# Patient Record
Sex: Male | Born: 1955
Health system: Southern US, Community
[De-identification: ages and names within clinical notes are randomized; demographics above are authoritative.]

## PROBLEM LIST (undated history)

## (undated) DIAGNOSIS — I10 Essential (primary) hypertension: Secondary | ICD-10-CM

## (undated) DIAGNOSIS — Z87442 Personal history of urinary calculi: Secondary | ICD-10-CM

## (undated) DIAGNOSIS — I499 Cardiac arrhythmia, unspecified: Secondary | ICD-10-CM

## (undated) DIAGNOSIS — Z95810 Presence of automatic (implantable) cardiac defibrillator: Secondary | ICD-10-CM

## (undated) DIAGNOSIS — E785 Hyperlipidemia, unspecified: Secondary | ICD-10-CM

## (undated) DIAGNOSIS — I509 Heart failure, unspecified: Secondary | ICD-10-CM

## (undated) DIAGNOSIS — Z8489 Family history of other specified conditions: Secondary | ICD-10-CM

## (undated) DIAGNOSIS — I4891 Unspecified atrial fibrillation: Secondary | ICD-10-CM

## (undated) DIAGNOSIS — G4730 Sleep apnea, unspecified: Secondary | ICD-10-CM

## (undated) HISTORY — DX: Sleep apnea, unspecified: G47.30

## (undated) HISTORY — DX: Heart failure, unspecified: I50.9

## (undated) HISTORY — DX: Hyperlipidemia, unspecified: E78.5

---
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
## 2004-08-30 HISTORY — PX: KNEE SURGERY: SHX244

## 2006-11-28 DIAGNOSIS — I10 Essential (primary) hypertension: Secondary | ICD-10-CM | POA: Insufficient documentation

## 2006-11-28 DIAGNOSIS — E1169 Type 2 diabetes mellitus with other specified complication: Secondary | ICD-10-CM | POA: Insufficient documentation

## 2006-11-28 DIAGNOSIS — E785 Hyperlipidemia, unspecified: Secondary | ICD-10-CM | POA: Insufficient documentation

## 2006-11-28 DIAGNOSIS — E1159 Type 2 diabetes mellitus with other circulatory complications: Secondary | ICD-10-CM | POA: Insufficient documentation

## 2006-11-28 DIAGNOSIS — I152 Hypertension secondary to endocrine disorders: Secondary | ICD-10-CM | POA: Insufficient documentation

## 2007-10-16 DIAGNOSIS — E876 Hypokalemia: Secondary | ICD-10-CM | POA: Insufficient documentation

## 2007-11-03 ENCOUNTER — Ambulatory Visit: Payer: Self-pay | Admitting: Family Medicine

## 2010-08-30 HISTORY — PX: APPENDECTOMY: SHX54

## 2011-04-23 ENCOUNTER — Ambulatory Visit: Payer: Self-pay | Admitting: Unknown Physician Specialty

## 2011-04-27 LAB — PATHOLOGY REPORT

## 2011-05-19 ENCOUNTER — Ambulatory Visit: Payer: Self-pay | Admitting: Surgery

## 2011-05-26 ENCOUNTER — Inpatient Hospital Stay: Payer: Self-pay | Admitting: Surgery

## 2011-05-31 LAB — PATHOLOGY REPORT

## 2011-07-01 HISTORY — PX: COLECTOMY: SHX59

## 2014-04-02 DIAGNOSIS — R0602 Shortness of breath: Secondary | ICD-10-CM | POA: Insufficient documentation

## 2014-04-19 DIAGNOSIS — R002 Palpitations: Secondary | ICD-10-CM | POA: Insufficient documentation

## 2014-04-30 ENCOUNTER — Ambulatory Visit: Payer: Self-pay | Admitting: Internal Medicine

## 2014-05-16 DIAGNOSIS — Z9889 Other specified postprocedural states: Secondary | ICD-10-CM | POA: Insufficient documentation

## 2015-02-06 DIAGNOSIS — L821 Other seborrheic keratosis: Secondary | ICD-10-CM | POA: Insufficient documentation

## 2015-02-06 DIAGNOSIS — S022XXA Fracture of nasal bones, initial encounter for closed fracture: Secondary | ICD-10-CM | POA: Insufficient documentation

## 2015-02-06 DIAGNOSIS — R42 Dizziness and giddiness: Secondary | ICD-10-CM | POA: Insufficient documentation

## 2015-02-06 DIAGNOSIS — R0683 Snoring: Secondary | ICD-10-CM | POA: Insufficient documentation

## 2015-02-06 DIAGNOSIS — F439 Reaction to severe stress, unspecified: Secondary | ICD-10-CM | POA: Insufficient documentation

## 2015-02-06 DIAGNOSIS — E781 Pure hyperglyceridemia: Secondary | ICD-10-CM | POA: Insufficient documentation

## 2015-02-14 ENCOUNTER — Ambulatory Visit (INDEPENDENT_AMBULATORY_CARE_PROVIDER_SITE_OTHER): Payer: 59 | Admitting: Family Medicine

## 2015-02-14 ENCOUNTER — Other Ambulatory Visit: Payer: Self-pay

## 2015-02-14 ENCOUNTER — Encounter: Payer: Self-pay | Admitting: Family Medicine

## 2015-02-14 VITALS — BP 142/84 | HR 87 | Temp 98.0°F | Resp 18 | Ht 68.5 in | Wt 239.2 lb

## 2015-02-14 DIAGNOSIS — H9193 Unspecified hearing loss, bilateral: Secondary | ICD-10-CM | POA: Diagnosis not present

## 2015-02-14 DIAGNOSIS — R351 Nocturia: Secondary | ICD-10-CM | POA: Diagnosis not present

## 2015-02-14 DIAGNOSIS — E781 Pure hyperglyceridemia: Secondary | ICD-10-CM

## 2015-02-14 DIAGNOSIS — F17200 Nicotine dependence, unspecified, uncomplicated: Secondary | ICD-10-CM | POA: Insufficient documentation

## 2015-02-14 DIAGNOSIS — I10 Essential (primary) hypertension: Secondary | ICD-10-CM | POA: Diagnosis not present

## 2015-02-14 DIAGNOSIS — R079 Chest pain, unspecified: Secondary | ICD-10-CM | POA: Insufficient documentation

## 2015-02-14 DIAGNOSIS — R42 Dizziness and giddiness: Secondary | ICD-10-CM | POA: Diagnosis not present

## 2015-02-14 DIAGNOSIS — Z0000 Encounter for general adult medical examination without abnormal findings: Secondary | ICD-10-CM | POA: Insufficient documentation

## 2015-02-14 LAB — POCT URINALYSIS DIPSTICK
Bilirubin, UA: NEGATIVE
Blood, UA: NEGATIVE
Glucose, UA: NEGATIVE
Ketones, UA: NEGATIVE
Leukocytes, UA: NEGATIVE
Nitrite, UA: NEGATIVE
Protein, UA: NEGATIVE
Spec Grav, UA: 1.015
Urobilinogen, UA: 0.2
pH, UA: 6

## 2015-02-14 NOTE — Progress Notes (Signed)
Subjective: HME    Patient ID: Robert Grant, male    DOB: 12-27-1955, 59 y.o.   MRN: [id number]  HPI This 59 year old male presents for an annual physical exam. Complains of some occasional light headed sensation with very mild headache. No ataxia, nausea or syncopy. Thinks it may be due to changes in vision and need for new glasses. The patient has been in otherwise good general health in the past. Blood pressure well controlled and tolerating Amlodipine with Valsartan without side effects.   Patient Active Problem List   Diagnosis Date Noted  . Compulsive tobacco user syndrome 02/14/2015  . Chest pain 02/14/2015  . Closed fracture of nasal bones 02/06/2015  . Dizziness 02/06/2015  . Hypertriglyceridemia 02/06/2015  . Basal cell papilloma 02/06/2015  . Feeling stressed out 02/06/2015  . Snores 02/06/2015  . H/O cardiac catheterization 05/16/2014  . Awareness of heartbeats 04/19/2014  . Breath shortness 04/02/2014  . Decreased potassium in the blood 10/16/2007  . Essential (primary) hypertension 11/28/2006  . HLD (hyperlipidemia) 11/28/2006   Past Surgical History  Procedure Laterality Date  . Tonsillectomy and adenoidectomy  1960  . Knee surgery Right 2006  . Partial right colectomy for benign tumor  07/2011   History  Substance Use Topics  . Smoking status: Former Smoker -- 3.00 packs/day for 11 years    Types: Cigarettes  . Smokeless tobacco: Not on file  . Alcohol Use: No   Family History  Problem Relation Age of Onset  . Diabetes Mother   . Hypertension Mother   . Kidney disease Mother   . Hyperlipidemia Mother   . Lung cancer Father   . Congestive Heart Failure Maternal Grandmother   . Congestive Heart Failure Paternal Grandmother    Current Outpatient Prescriptions on File Prior to Visit  Medication Sig Dispense Refill  . amLODipine (NORVASC) 10 MG tablet Take 1 tablet by mouth daily.    . Choline Fenofibrate (TRILIPIX) 135 MG capsule Take 1 capsule by  mouth daily.    . valsartan (DIOVAN) 160 MG tablet Take 1 tablet by mouth daily.     No current facility-administered medications on file prior to visit.   Allergies  Allergen Reactions  . Niacin Hives   Review of Systems  Constitutional: Negative.   Eyes: Positive for visual disturbance.       Occasionally blurred vision and need glasses changed  Cardiovascular: Negative.   Gastrointestinal: Negative.        4-5 BM's per day since partial colectomy for a benign tumor  Genitourinary: Positive for difficulty urinating. Negative for dysuria, frequency, hematuria and flank pain.       Slow stream at night and nocturia 2-3 times a night  Musculoskeletal: Negative.   Neurological: Positive for light-headedness and headaches. Negative for weakness and numbness.       Very light headache occasionally  Psychiatric/Behavioral: Negative.       BP 142/84 mmHg  Pulse 87  Temp(Src) 98 F (36.7 C) (Oral)  Resp 18  Ht 5' 8.5" (1.74 m)  Wt 239 lb 3.2 oz (108.5 kg)  BMI 35.84 kg/m2  SpO2 96%  Objective:   Physical Exam  Constitutional: He is oriented to person, place, and time. He appears well-developed and well-nourished.  HENT:  Head: Normocephalic and atraumatic.  Right Ear: External ear normal.  Left Ear: External ear normal.  Nose: Nose normal.  Mouth/Throat: Oropharynx is clear and moist.  Eyes: Conjunctivae and EOM are normal. Pupils are  equal, round, and reactive to light. Right eye exhibits no discharge.  Neck: Normal range of motion. Neck supple. No tracheal deviation present. No thyromegaly present.  Cardiovascular: Normal rate, regular rhythm, normal heart sounds and intact distal pulses.   No murmur heard. Pulmonary/Chest: Effort normal and breath sounds normal. No respiratory distress. He has no wheezes. He has no rales. He exhibits no tenderness.  Abdominal: Soft. He exhibits no distension and no mass. There is no tenderness. There is no rebound and no guarding. Hernia  confirmed negative in the right inguinal area and confirmed negative in the left inguinal area.  Genitourinary: Rectum normal, prostate normal and penis normal. Guaiac negative stool.  Musculoskeletal: Normal range of motion. He exhibits no edema or tenderness.  Lymphadenopathy:    He has no cervical adenopathy.  Neurological: He is alert and oriented to person, place, and time. He has normal reflexes. No cranial nerve deficit. He exhibits normal muscle tone. Coordination normal.  Skin: Skin is warm and dry. No rash noted. No erythema.  Psychiatric: He has a normal mood and affect. His behavior is normal. Judgment and thought content normal.      Assessment & Plan:  1. Annual physical exam Good general health except obesity. Only exercise is to work in the yard at home. Immunizations up to date. Given anticipatory guidance.  Immunization History  Administered Date(s) Administered  . Tdap 02/13/2008  . Zoster 06/26/2014   2. Essential (primary) hypertension Fair control of blood pressure. EKG showed sinus rhythm with occasional PVC. No sign of ischemia. Will continue present medications and recheck routine labs. - CBC with Differential/Platelet - EKG 12-Lead  3. Hypertriglyceridemia No longer taking the Fenofibrate. Still needs to lose weight and follow low fat diet with exercise regularly. Will recheck labs. - Comprehensive metabolic panel - Lipid Profile - TSH  4. Feeling light headed Occasional occurrence. No recent illness or injury. EKG showed occasional PVC, otherwise normal. Will check routine labs for metabolic disorder or dehydration versus anemia. - CBC with Differential/Platelet - Comprehensive metabolic panel - TSH - EKG 12-Lead  5. Nocturia more than twice per night Gets up 3 times a night to urinate. No polydipsia. No hematuria. Some decrease in force of urine stream. Suspect early BPH. No nodules on exam. Urinalysis normal. Will check PSA. - PSA - POCT Urinalysis  Dipstick  6. Decreased hearing, bilateral Recognizes difficulty understanding conversations with background noise. Screening audiometry shows significant loss in all frequencies. Recommend he consider referral to audiologist. Will postpone for now. - Hearing screening

## 2015-02-15 LAB — COMPREHENSIVE METABOLIC PANEL
ALT: 42 IU/L (ref 0–44)
AST: 38 IU/L (ref 0–40)
Albumin/Globulin Ratio: 2.3 (ref 1.1–2.5)
Albumin: 4.8 g/dL (ref 3.6–4.8)
Alkaline Phosphatase: 71 IU/L (ref 39–117)
BUN/Creatinine Ratio: 15 (ref 10–22)
BUN: 14 mg/dL (ref 8–27)
Bilirubin Total: 1 mg/dL (ref 0.0–1.2)
CO2: 29 mmol/L (ref 18–29)
Calcium: 9.5 mg/dL (ref 8.6–10.2)
Chloride: 96 mmol/L — ABNORMAL LOW (ref 97–108)
Creatinine, Ser: 0.92 mg/dL (ref 0.76–1.27)
GFR calc Af Amer: 104 mL/min/{1.73_m2} (ref >59)
GFR calc non Af Amer: 90 mL/min/{1.73_m2} (ref >59)
Globulin, Total: 2.1 g/dL (ref 1.5–4.5)
Glucose: 115 mg/dL — ABNORMAL HIGH (ref 65–99)
Potassium: 3.4 mmol/L — ABNORMAL LOW (ref 3.5–5.2)
Sodium: 141 mmol/L (ref 134–144)
Total Protein: 6.9 g/dL (ref 6.0–8.5)

## 2015-02-15 LAB — CBC WITH DIFFERENTIAL/PLATELET
Basophils Absolute: 0.1 10*3/uL (ref 0.0–0.2)
Basos: 1 %
EOS (ABSOLUTE): 0.3 10*3/uL (ref 0.0–0.4)
Eos: 3 %
Hematocrit: 45.2 % (ref 37.5–51.0)
Hemoglobin: 15.5 g/dL (ref 12.6–17.7)
Immature Grans (Abs): 0 10*3/uL (ref 0.0–0.1)
Immature Granulocytes: 0 %
Lymphocytes Absolute: 4.1 10*3/uL — ABNORMAL HIGH (ref 0.7–3.1)
Lymphs: 46 %
MCH: 30.2 pg (ref 26.6–33.0)
MCHC: 34.3 g/dL (ref 31.5–35.7)
MCV: 88 fL (ref 79–97)
Monocytes Absolute: 0.6 10*3/uL (ref 0.1–0.9)
Monocytes: 7 %
Neutrophils Absolute: 3.8 10*3/uL (ref 1.4–7.0)
Neutrophils: 43 %
Platelets: 152 10*3/uL (ref 150–379)
RBC: 5.14 x10E6/uL (ref 4.14–5.80)
RDW: 14.6 % (ref 12.3–15.4)
WBC: 8.9 10*3/uL (ref 3.4–10.8)

## 2015-02-15 LAB — LIPID PANEL
Chol/HDL Ratio: 8.6 ratio units — ABNORMAL HIGH (ref 0.0–5.0)
Cholesterol, Total: 197 mg/dL (ref 100–199)
HDL: 23 mg/dL — ABNORMAL LOW (ref >39)
Triglycerides: 727 mg/dL (ref 0–149)

## 2015-02-15 LAB — PSA: Prostate Specific Ag, Serum: 1.2 ng/mL (ref 0.0–4.0)

## 2015-02-15 LAB — TSH: TSH: 1.33 u[IU]/mL (ref 0.450–4.500)

## 2015-02-17 ENCOUNTER — Telehealth: Payer: Self-pay

## 2015-02-17 NOTE — Telephone Encounter (Signed)
LMTCB

## 2015-02-17 NOTE — Telephone Encounter (Signed)
Patient advised as directed below. Patient verbalized understanding. Patient wanted to know the results of the hearing test that was done in office on 02/14/15. Please advise.

## 2015-02-17 NOTE — Telephone Encounter (Signed)
Significant hearing deficit in all frequencies in both ears. Normal high frequency hearing on the right. Probably should consider seeing an audiologist for more complete hearing tests and recommendation.

## 2015-02-17 NOTE — Telephone Encounter (Signed)
-----   Message from Margo Common, [place] sent at 02/16/2015 11:01 PM EDT ----- Normal blood cell counts. Borderline elevation of sugar, potassium slightly down and normal total cholesterol. HDL lowe but triglycerides very high (at 727 - goal,150). Need to get on low fat diet and exercise daily. Recommend Krill Oil daily. Eat extra fresh fruits and vegetables to get potassium back to normal.

## 2015-02-18 NOTE — Telephone Encounter (Signed)
LMTCB

## 2015-02-18 NOTE — Telephone Encounter (Signed)
Patient advised as directed below. Patient verbalized understanding.  

## 2015-03-26 ENCOUNTER — Telehealth: Payer: Self-pay | Admitting: *Deleted

## 2015-03-26 NOTE — Telephone Encounter (Signed)
Patient called office complaining of symptoms of chest pain, dizziness, SOB, and palpitations. Patient stated that he has had this symptoms for 2 weeks off and on. Advised patient to go to ER. Patient stated that he will go to ER to be seen for chest pain.

## 2015-03-27 ENCOUNTER — Encounter: Payer: Self-pay | Admitting: Family Medicine

## 2015-03-27 ENCOUNTER — Ambulatory Visit (INDEPENDENT_AMBULATORY_CARE_PROVIDER_SITE_OTHER): Payer: 59 | Admitting: Family Medicine

## 2015-03-27 VITALS — BP 128/82 | HR 70 | Temp 98.0°F | Resp 16 | Wt 245.2 lb

## 2015-03-27 DIAGNOSIS — I493 Ventricular premature depolarization: Secondary | ICD-10-CM

## 2015-03-27 DIAGNOSIS — R079 Chest pain, unspecified: Secondary | ICD-10-CM

## 2015-03-27 NOTE — Progress Notes (Signed)
Subjective:    Patient ID: Robert Grant, male    DOB: October 11, 1955, 59 y.o.   MRN: [id number] Chief Complaint  Patient presents with  . Chest Pain    X 2 days    HPI  This 59 year old male had a significant left chest pain, dyspnea and palpitations for 5-8 seconds on 03-09-15. Rudy Jew it took a couple hours to get energy and "breath" back and he proceeded with his regular activities. Recurrence 2 days ago after working in the yard during the heat of the day. Was feeling heart racing and irregular. Recurred couple times for 3-5 seconds and continues to have shortness of breath with exertion. Got up a 4:30 am (usual time to arise) and had another round of palpitations and chest pain. Had called this office and was advised to go to the ER but decided not to go. Has not had follow up with Dr. Nehemiah Massed (cardiologist) this year. Has a history of frequent PVC's with some bigeminy and trigeminy.   History reviewed. No pertinent past medical history. Patient Active Problem List   Diagnosis Date Noted  . Compulsive tobacco user syndrome 02/14/2015  . Chest pain 02/14/2015  . Annual physical exam 02/14/2015  . Closed fracture of nasal bones 02/06/2015  . Dizziness 02/06/2015  . Hypertriglyceridemia 02/06/2015  . Basal cell papilloma 02/06/2015  . Feeling stressed out 02/06/2015  . Snores 02/06/2015  . H/O cardiac catheterization 05/16/2014  . Awareness of heartbeats 04/19/2014  . Breath shortness 04/02/2014  . Decreased potassium in the blood 10/16/2007  . Essential (primary) hypertension 11/28/2006  . HLD (hyperlipidemia) 11/28/2006   Past Surgical History  Procedure Laterality Date  . Tonsillectomy and adenoidectomy  1960  . Knee surgery Right 2006  . Colectomy  07/2011   History  Substance Use Topics  . Smoking status: Former Smoker -- 3.00 packs/day for 11 years    Types: Cigarettes  . Smokeless tobacco: Not on file  . Alcohol Use: No   Family History  Problem Relation Age of  Onset  . Diabetes Mother   . Hypertension Mother   . Kidney disease Mother   . Hyperlipidemia Mother   . Lung cancer Father   . Congestive Heart Failure Maternal Grandmother   . Congestive Heart Failure Paternal Grandmother    Current Outpatient Prescriptions on File Prior to Visit  Medication Sig Dispense Refill  . amLODipine (NORVASC) 10 MG tablet Take 1 tablet by mouth daily.    Marland Kitchen aspirin EC 81 MG tablet Take 1 tablet by mouth daily.    . valsartan (DIOVAN) 160 MG tablet Take 1 tablet by mouth daily.     No current facility-administered medications on file prior to visit.   Allergies  Allergen Reactions  . Niacin Hives   Review of Systems  Constitutional: Positive for fatigue.  Eyes: Negative.   Respiratory: Positive for shortness of breath.        With exertion  Cardiovascular: Positive for chest pain and palpitations.  Gastrointestinal: Negative.   Neurological: Positive for dizziness and headaches.       Intermittently with episodes of palpitations.      BP 128/82 mmHg  Pulse 70  Temp(Src) 98 F (36.7 C) (Oral)  Resp 16  Wt 245 lb 3.2 oz (111.222 kg)  Objective:   Physical Exam  Constitutional: He is oriented to person, place, and time. He appears well-developed and well-nourished.  HENT:  Head: Normocephalic and atraumatic.  Right Ear: External ear normal.  Left Ear: External ear normal.  Nose: Nose normal.  Mouth/Throat: Oropharynx is clear and moist.  Eyes: EOM are normal. Pupils are equal, round, and reactive to light.  Neck: Normal range of motion. Neck supple.  Cardiovascular: Normal heart sounds and intact distal pulses.   Intermittent irregularity to heart rhythm.  Pulmonary/Chest: Effort normal and breath sounds normal.  Abdominal: Soft. Bowel sounds are normal.  Neurological: He is alert and oriented to person, place, and time.  Skin: Skin is warm and dry.  Psychiatric: He has a normal mood and affect. His behavior is normal. Thought content  normal.     Assessment & Plan:   1. Chest pain, unspecified chest pain type Recurrent chest pains, dyspnea, dizziness and palpitations with easy fatigability over the past couple weeks. Will check labs and schedule follow up with cardiologist. EKG shows frequent PVC's. Declined referral to ER. - EKG 12-Lead - Comprehensive metabolic panel - CBC with Differential/Platelet - Troponin I - Ambulatory referral to Cardiology  2. Ventricular ectopy History of frequent PVC's with some bigeminy and trigeminy last year. Having more episodes with DOE. Recommend follow up with cardiologist. - Ambulatory referral to Cardiology

## 2015-03-28 ENCOUNTER — Telehealth: Payer: Self-pay

## 2015-03-28 DIAGNOSIS — I480 Paroxysmal atrial fibrillation: Secondary | ICD-10-CM | POA: Insufficient documentation

## 2015-03-28 LAB — CBC WITH DIFFERENTIAL/PLATELET
Basophils Absolute: 0 10*3/uL (ref 0.0–0.2)
Basos: 0 %
EOS (ABSOLUTE): 0.2 10*3/uL (ref 0.0–0.4)
Eos: 3 %
Hematocrit: 44 % (ref 37.5–51.0)
Hemoglobin: 14.9 g/dL (ref 12.6–17.7)
Immature Grans (Abs): 0 10*3/uL (ref 0.0–0.1)
Immature Granulocytes: 0 %
Lymphocytes Absolute: 3.7 10*3/uL — ABNORMAL HIGH (ref 0.7–3.1)
Lymphs: 41 %
MCH: 29.6 pg (ref 26.6–33.0)
MCHC: 33.9 g/dL (ref 31.5–35.7)
MCV: 88 fL (ref 79–97)
Monocytes Absolute: 0.7 10*3/uL (ref 0.1–0.9)
Monocytes: 7 %
Neutrophils Absolute: 4.4 10*3/uL (ref 1.4–7.0)
Neutrophils: 49 %
Platelets: 164 10*3/uL (ref 150–379)
RBC: 5.03 x10E6/uL (ref 4.14–5.80)
RDW: 14.6 % (ref 12.3–15.4)
WBC: 9 10*3/uL (ref 3.4–10.8)

## 2015-03-28 LAB — COMPREHENSIVE METABOLIC PANEL
ALT: 26 IU/L (ref 0–44)
AST: 24 IU/L (ref 0–40)
Albumin/Globulin Ratio: 2.1 (ref 1.1–2.5)
Albumin: 4.6 g/dL (ref 3.6–4.8)
Alkaline Phosphatase: 62 IU/L (ref 39–117)
BUN/Creatinine Ratio: 13 (ref 10–22)
BUN: 11 mg/dL (ref 8–27)
Bilirubin Total: 0.8 mg/dL (ref 0.0–1.2)
CO2: 26 mmol/L (ref 18–29)
Calcium: 9.1 mg/dL (ref 8.6–10.2)
Chloride: 93 mmol/L — ABNORMAL LOW (ref 97–108)
Creatinine, Ser: 0.84 mg/dL (ref 0.76–1.27)
GFR calc Af Amer: 110 mL/min/{1.73_m2} (ref >59)
GFR calc non Af Amer: 95 mL/min/{1.73_m2} (ref >59)
Globulin, Total: 2.2 g/dL (ref 1.5–4.5)
Glucose: 134 mg/dL — ABNORMAL HIGH (ref 65–99)
Potassium: 3.4 mmol/L — ABNORMAL LOW (ref 3.5–5.2)
Sodium: 142 mmol/L (ref 134–144)
Total Protein: 6.8 g/dL (ref 6.0–8.5)

## 2015-03-28 LAB — TROPONIN I: Troponin I: 0.04 ng/mL (ref 0.00–0.04)

## 2015-03-28 MED ORDER — POTASSIUM CHLORIDE ER 10 MEQ PO TBCR: 10.0000 meq | EXTENDED_RELEASE_TABLET | Freq: Every day | ORAL | Status: DC

## 2015-03-28 NOTE — Telephone Encounter (Signed)
Pt is returning call.  UK#[phone]/VO

## 2015-03-28 NOTE — Telephone Encounter (Signed)
LMTCB, RX sent to pharmacy.

## 2015-03-28 NOTE — Telephone Encounter (Signed)
-----   Message from Margo Common, [place] sent at 03/28/2015  2:15 PM EDT ----- No sign of anemia or infections. Blood sugar elevated (may not have been a true fasting test) and potassium still a little low. Troponin level normal - no sign of heart damage/infarction. Recommend proceeding with cardiology evaluation and add Klorcon 10 meq qd #30. Recheck potassium level in a month.

## 2015-03-28 NOTE — Telephone Encounter (Signed)
LMTCB

## 2015-03-31 DIAGNOSIS — I493 Ventricular premature depolarization: Secondary | ICD-10-CM | POA: Insufficient documentation

## 2015-03-31 NOTE — Telephone Encounter (Signed)
Patient advised as directed below. Patient verbalized understanding and agrees with treatment plan. Patient states he has followed up with cardiologist.

## 2015-04-12 ENCOUNTER — Encounter: Payer: Self-pay | Admitting: *Deleted

## 2015-04-12 ENCOUNTER — Other Ambulatory Visit: Payer: Self-pay

## 2015-04-12 ENCOUNTER — Inpatient Hospital Stay (HOSPITAL_COMMUNITY)
Admission: AD | Admit: 2015-04-12 | Payer: Self-pay | Source: Other Acute Inpatient Hospital | Admitting: Cardiovascular Disease

## 2015-04-12 ENCOUNTER — Inpatient Hospital Stay
Admission: EM | Admit: 2015-04-12 | Discharge: 2015-04-13 | DRG: 310 | Disposition: A | Discharge status: Other Acute Inpatient Hospital | Payer: 59 | Attending: Internal Medicine | Admitting: Internal Medicine

## 2015-04-12 ENCOUNTER — Emergency Department: Payer: 59

## 2015-04-12 DIAGNOSIS — E669 Obesity, unspecified: Secondary | ICD-10-CM | POA: Diagnosis present

## 2015-04-12 DIAGNOSIS — I509 Heart failure, unspecified: Secondary | ICD-10-CM

## 2015-04-12 DIAGNOSIS — I4891 Unspecified atrial fibrillation: Secondary | ICD-10-CM | POA: Diagnosis present

## 2015-04-12 DIAGNOSIS — Z888 Allergy status to other drugs, medicaments and biological substances status: Secondary | ICD-10-CM | POA: Diagnosis not present

## 2015-04-12 DIAGNOSIS — Z87891 Personal history of nicotine dependence: Secondary | ICD-10-CM | POA: Diagnosis not present

## 2015-04-12 DIAGNOSIS — Z79899 Other long term (current) drug therapy: Secondary | ICD-10-CM | POA: Diagnosis not present

## 2015-04-12 DIAGNOSIS — Z6835 Body mass index (BMI) 35.0-35.9, adult: Secondary | ICD-10-CM

## 2015-04-12 DIAGNOSIS — Z7901 Long term (current) use of anticoagulants: Secondary | ICD-10-CM | POA: Diagnosis not present

## 2015-04-12 DIAGNOSIS — I10 Essential (primary) hypertension: Secondary | ICD-10-CM | POA: Diagnosis present

## 2015-04-12 DIAGNOSIS — E860 Dehydration: Secondary | ICD-10-CM | POA: Diagnosis present

## 2015-04-12 DIAGNOSIS — E785 Hyperlipidemia, unspecified: Secondary | ICD-10-CM | POA: Diagnosis present

## 2015-04-12 HISTORY — DX: Unspecified atrial fibrillation: I48.91

## 2015-04-12 HISTORY — DX: Essential (primary) hypertension: I10

## 2015-04-12 LAB — COMPREHENSIVE METABOLIC PANEL
ALT: 24 U/L (ref 17–63)
AST: 29 U/L (ref 15–41)
Albumin: 4.4 g/dL (ref 3.5–5.0)
Alkaline Phosphatase: 57 U/L (ref 38–126)
Anion gap: 13 (ref 5–15)
BUN: 24 mg/dL — ABNORMAL HIGH (ref 6–20)
CO2: 24 mmol/L (ref 22–32)
Calcium: 9.3 mg/dL (ref 8.9–10.3)
Chloride: 105 mmol/L (ref 101–111)
Creatinine, Ser: 1.13 mg/dL (ref 0.61–1.24)
GFR calc Af Amer: >60 mL/min (ref >60)
GFR calc non Af Amer: >60 mL/min (ref >60)
Glucose, Bld: 143 mg/dL — ABNORMAL HIGH (ref 65–99)
Potassium: 3.5 mmol/L (ref 3.5–5.1)
Sodium: 142 mmol/L (ref 135–145)
Total Bilirubin: 0.9 mg/dL (ref 0.3–1.2)
Total Protein: 7.2 g/dL (ref 6.5–8.1)

## 2015-04-12 LAB — CBC
HCT: 44.3 % (ref 40.0–52.0)
Hemoglobin: 14.9 g/dL (ref 13.0–18.0)
MCH: 29.6 pg (ref 26.0–34.0)
MCHC: 33.6 g/dL (ref 32.0–36.0)
MCV: 88.1 fL (ref 80.0–100.0)
Platelets: 196 10*3/uL (ref 150–440)
RBC: 5.03 MIL/uL (ref 4.40–5.90)
RDW: 14.4 % (ref 11.5–14.5)
WBC: 11.9 10*3/uL — ABNORMAL HIGH (ref 3.8–10.6)

## 2015-04-12 LAB — TROPONIN I: Troponin I: 0.05 ng/mL — ABNORMAL HIGH (ref <0.031)

## 2015-04-12 LAB — BRAIN NATRIURETIC PEPTIDE: B Natriuretic Peptide: 493 pg/mL — ABNORMAL HIGH (ref 0.0–100.0)

## 2015-04-12 LAB — MAGNESIUM: Magnesium: 1.8 mg/dL (ref 1.7–2.4)

## 2015-04-12 MED ORDER — AMIODARONE LOAD VIA INFUSION
150.0000 mg | Freq: Once | INTRAVENOUS | Status: AC
  Administered 2015-04-12: 150 mg via INTRAVENOUS

## 2015-04-12 MED ORDER — APIXABAN 5 MG PO TABS: 5.0000 mg | ORAL_TABLET | Freq: Two times a day (BID) | ORAL | Status: DC

## 2015-04-12 MED ORDER — DIGOXIN 0.25 MG/ML IJ SOLN
0.2500 mg | INTRAMUSCULAR | Status: AC
  Administered 2015-04-12: 0.25 mg via INTRAVENOUS
  Filled 2015-04-12: qty 2

## 2015-04-12 MED ORDER — DILTIAZEM HCL 25 MG/5ML IV SOLN
10.0000 mg | Freq: Once | INTRAVENOUS | Status: AC
  Administered 2015-04-12: 10 mg via INTRAVENOUS

## 2015-04-12 MED ORDER — DILTIAZEM HCL 25 MG/5ML IV SOLN
INTRAVENOUS | Status: AC
  Filled 2015-04-12: qty 5

## 2015-04-12 MED ORDER — SODIUM CHLORIDE 0.9 % IV SOLN: 250.0000 mL | INTRAVENOUS | Status: DC | PRN

## 2015-04-12 MED ORDER — AMIODARONE HCL IN DEXTROSE 360-4.14 MG/200ML-% IV SOLN
60.0000 mg/h | INTRAVENOUS | Status: DC
  Filled 2015-04-12: qty 200

## 2015-04-12 MED ORDER — SODIUM CHLORIDE 0.9 % IJ SOLN: 3.0000 mL | INTRAMUSCULAR | Status: DC | PRN

## 2015-04-12 MED ORDER — POTASSIUM CHLORIDE ER 10 MEQ PO TBCR: 10.0000 meq | EXTENDED_RELEASE_TABLET | Freq: Every day | ORAL | Status: DC

## 2015-04-12 MED ORDER — ONDANSETRON HCL 4 MG/2ML IJ SOLN: 4.0000 mg | Freq: Four times a day (QID) | INTRAMUSCULAR | Status: DC | PRN

## 2015-04-12 MED ORDER — HEPARIN SODIUM (PORCINE) 5000 UNIT/ML IJ SOLN: 5000.0000 [IU] | Freq: Three times a day (TID) | INTRAMUSCULAR | Status: DC

## 2015-04-12 MED ORDER — ACETAMINOPHEN 650 MG RE SUPP: 650.0000 mg | Freq: Four times a day (QID) | RECTAL | Status: DC | PRN

## 2015-04-12 MED ORDER — ONDANSETRON HCL 4 MG PO TABS: 4.0000 mg | ORAL_TABLET | Freq: Four times a day (QID) | ORAL | Status: DC | PRN

## 2015-04-12 MED ORDER — ACETAMINOPHEN 325 MG PO TABS: 650.0000 mg | ORAL_TABLET | Freq: Four times a day (QID) | ORAL | Status: DC | PRN

## 2015-04-12 MED ORDER — ASPIRIN 81 MG PO CHEW
162.0000 mg | CHEWABLE_TABLET | Freq: Once | ORAL | Status: AC
  Administered 2015-04-12: 162 mg via ORAL
  Filled 2015-04-12: qty 2

## 2015-04-12 MED ORDER — AMIODARONE HCL IN DEXTROSE 360-4.14 MG/200ML-% IV SOLN
30.0000 mg/h | INTRAVENOUS | Status: DC
  Administered 2015-04-12: 30 mg/h via INTRAVENOUS
  Filled 2015-04-12: qty 200

## 2015-04-12 MED ORDER — SODIUM CHLORIDE 0.9 % IJ SOLN: 3.0000 mL | Freq: Two times a day (BID) | INTRAMUSCULAR | Status: DC

## 2015-04-12 MED ORDER — FUROSEMIDE 10 MG/ML IJ SOLN
40.0000 mg | Freq: Two times a day (BID) | INTRAMUSCULAR | Status: DC
  Administered 2015-04-12: 40 mg via INTRAVENOUS
  Filled 2015-04-12: qty 4

## 2015-04-12 MED ORDER — DILTIAZEM HCL 25 MG/5ML IV SOLN: 10.0000 mg | Freq: Once | INTRAVENOUS | Status: DC

## 2015-04-12 MED ORDER — FUROSEMIDE 10 MG/ML IJ SOLN
20.0000 mg | INTRAMUSCULAR | Status: AC
  Administered 2015-04-12: 20 mg via INTRAVENOUS
  Filled 2015-04-12: qty 4

## 2015-04-12 MED ORDER — ALBUTEROL SULFATE (2.5 MG/3ML) 0.083% IN NEBU: 2.5000 mg | INHALATION_SOLUTION | RESPIRATORY_TRACT | Status: DC | PRN

## 2015-04-12 NOTE — Discharge Summary (Addendum)
Physician Discharge Summary  Patient ID: Robert Grant MRN: [id number] DOB/AGE: [birth year] 59 y.o.  Admit date: 04/12/2015 Discharge date: 04/12/2015  Admission Diagnoses: Atrial Fibrillation with RVR  Discharge Diagnoses: Atrial Fibrilation with RVR Active Problems:   Acute CHF   A-fib   Discharged Condition: serious  Hospital Course:  1. Atrial Fibrillation with RVR: Patient given 1 dose of IV cardizem which did not slow HR but did drop SBP to 90. Given 0.25mg  dig with no effect. Given amiodarone bolus and started on drip. Given second bolus 2 hours later. HR still in 140's. Patient remained in the ED during this time. No ICU beds available. Have arranged transfer to ICU at [facility] for further care.  2. Mild CHF: Give one dose IV lasix.  3. Elevated Trop: Likely secondary to rapid A-Fib.   Consults: None  Significant Diagnostic Studies: labs:   Treatments: cardiac meds: amiodarone  Discharge Exam: Blood pressure 107/88, pulse 132, temperature 98.1 F (36.7 C), temperature source Oral, resp. rate 31, height 5\' 9"  (1.753 m), weight 108.863 kg (240 lb), SpO2 97 %. Cardio: irregularly irregular rhythm  Disposition: Patient being transferred to [facility] for further treatment. Accepting physician Dr Francis Dowse  Time spent 60 min    Medication List    TAKE these medications        diltiazem 240 MG 24 hr capsule  Commonly known as:  CARDIZEM CD  Take 240 mg by mouth daily.     DIOVAN 160 MG tablet  Generic drug:  valsartan  Take 1 tablet by mouth daily.     ELIQUIS 5 MG Tabs tablet  Generic drug:  apixaban  Take 5 mg by mouth 2 (two) times daily.     potassium chloride 10 MEQ tablet  Commonly known as:  KLOR-CON 10  Take 1 tablet (10 mEq total) by mouth daily.     triamterene-hydrochlorothiazide 37.5-25 MG per tablet  Commonly known as:  MAXZIDE-25  Take 1 tablet by mouth daily.         Signed: Baxter Hire 04/12/2015, 10:05 PM

## 2015-04-12 NOTE — H&P (Signed)
[facility] NAME: Robert Grant    MR#:  [id number]  DATE OF BIRTH:  1956-03-23  DATE OF ADMISSION:  04/12/2015  PRIMARY CARE PHYSICIAN: Vernie Murders, PA   REQUESTING/REFERRING PHYSICIAN: Delman Kitten, MD  CHIEF COMPLAINT:   Chief Complaint  Patient presents with  . Chest Pain  . Shortness of Breath    HISTORY OF PRESENT ILLNESS:  Robert Grant  is a 59 y.o. male with a known history of hypertension, hyperlipidemia, CHF and recent diagnosed A. fib. The patient has had the shortness of breath for the past 2 months which has been worsening for the past the one week, especially for the past that 3 days. The patient was diagnosis with A. fib 1 months ago and was put on Eliquis by cardiologist. Patient has had worsening dyspnea on exertion, orthopnea and nocturnal dyspnea but denies any leg edema. Patient had chest pain yesterday which is sharp and on the left side. He has a generalized weakness. He was found to have A. fib with heart rate at about 140s. He was treated with Cardizem 10 mg IV twice without improvement. In addition, his blood pressure decreased to about 90s. So he got 1 dose of digoxin without improvement.  PAST MEDICAL HISTORY:  No past medical history on file. hypertension, hyperlipidemia, CHF and recent diagnosed A. Fib.  PAST SURGICAL HISTORY:   Past Surgical History  Procedure Laterality Date  . Tonsillectomy and adenoidectomy  1960  . Knee surgery Right 2006  . Colectomy  07/2011    SOCIAL HISTORY:   Social History  Substance Use Topics  . Smoking status: Former Smoker -- 3.00 packs/day for 11 years    Types: Cigarettes  . Smokeless tobacco: Not on file  . Alcohol Use: No    FAMILY HISTORY:   Family History  Problem Relation Age of Onset  . Diabetes Mother   . Hypertension Mother   . Kidney disease Mother   . Hyperlipidemia Mother   . Lung cancer Father   . Congestive Heart Failure Maternal  Grandmother   . Congestive Heart Failure Paternal Grandmother     DRUG ALLERGIES:   Allergies  Allergen Reactions  . Niacin Hives    REVIEW OF SYSTEMS:  CONSTITUTIONAL: No fever, generalized weakness.  EYES: No blurred or double vision.  EARS, NOSE, AND THROAT: No tinnitus or ear pain.  RESPIRATORY: No cough, shortness of breath, wheezing or hemoptysis.  CARDIOVASCULAR: No chest pain, but has orthopnea, nocturnal dyspnea, no leg edema.  GASTROINTESTINAL: No nausea, vomiting, diarrhea or abdominal pain.  GENITOURINARY: No dysuria, hematuria.  ENDOCRINE: No polyuria, nocturia,  HEMATOLOGY: No anemia, easy bruising or bleeding SKIN: No rash or lesion. MUSCULOSKELETAL: No joint pain or arthritis.   NEUROLOGIC: No tingling, numbness, weakness.  PSYCHIATRY: No anxiety or depression.   MEDICATIONS AT HOME:   Prior to Admission medications   Medication Sig Start Date End Date Taking? Authorizing Provider  diltiazem (CARDIZEM CD) 240 MG 24 hr capsule Take 240 mg by mouth daily. 03/28/15  Yes Historical Provider, MD  ELIQUIS 5 MG TABS tablet Take 5 mg by mouth 2 (two) times daily. 03/28/15  Yes Historical Provider, MD  potassium chloride (KLOR-CON 10) 10 MEQ tablet Take 1 tablet (10 mEq total) by mouth daily. 03/28/15  Yes Dennis E Chrismon, PA  triamterene-hydrochlorothiazide (MAXZIDE-25) 37.5-25 MG per tablet Take 1 tablet by mouth daily. 01/11/15  Yes Historical Provider, MD  valsartan (DIOVAN) 160 MG  tablet Take 1 tablet by mouth daily. 12/13/14  Yes Historical Provider, MD      VITAL SIGNS:  Blood pressure 122/97, pulse 61, temperature 98.3 F (36.8 C), temperature source Oral, resp. rate 28, height 5\' 9"  (1.753 m), weight 108.863 kg (240 lb), SpO2 95 %.  PHYSICAL EXAMINATION:  GENERAL:  59 y.o.-year-old patient lying in the bed with no acute distress. Obese. EYES: Pupils equal, round, reactive to light and accommodation. No scleral icterus. Extraocular muscles intact.  HEENT: Head  atraumatic, normocephalic. Oropharynx and nasopharynx clear.  NECK:  Supple, no jugular venous distention. No thyroid enlargement, no tenderness.  LUNGS: Normal breath sounds bilaterally, no wheezing, bilateral basilar rales. No use of accessory muscles of respiration.  CARDIOVASCULAR: S1, S2 normal. No murmurs, rubs, or gallops.  ABDOMEN: Soft, nontender, nondistended. Bowel sounds present. No organomegaly or mass.  EXTREMITIES: No pedal edema, cyanosis, or clubbing.  NEUROLOGIC: Cranial nerves II through XII are intact. Muscle strength 5/5 in all extremities. Sensation intact. Gait not checked.  PSYCHIATRIC: The patient is alert and oriented x 3.  SKIN: No obvious rash, lesion, or ulcer.   LABORATORY PANEL:   CBC  Recent Labs Lab 04/12/15 1524  WBC 11.9*  HGB 14.9  HCT 44.3  PLT 196   ------------------------------------------------------------------------------------------------------------------  Chemistries   Recent Labs Lab 04/12/15 1524  NA 142  K 3.5  CL 105  CO2 24  GLUCOSE 143*  BUN 24*  CREATININE 1.13  CALCIUM 9.3  AST 29  ALT 24  ALKPHOS 57  BILITOT 0.9   ------------------------------------------------------------------------------------------------------------------  Cardiac Enzymes  Recent Labs Lab 04/12/15 1524  TROPONINI 0.05*   ------------------------------------------------------------------------------------------------------------------  RADIOLOGY:  Dg Chest Port 1 View  04/12/2015   CLINICAL DATA:  59 year old male with 3 days of shortness of breath, palpitations. Atrial fibrillation. Former smoker. Initial encounter.  EXAM: PORTABLE CHEST - 1 VIEW  COMPARISON:  01/23/1996 chest radiograph report (no images available).  FINDINGS: Portable AP upright view at 1548 hrs. Cardiomegaly. Other mediastinal contours are within normal limits. Visualized tracheal air column is within normal limits. No pneumothorax, pleural effusion or consolidation.  Increased interstitial markings with basilar predominance.  IMPRESSION: Cardiomegaly with increased interstitial markings suggesting mild or developing interstitial edema in this setting.   Electronically Signed   By: Genevie Ann M.D.   On: 04/12/2015 15:58    EKG:   Orders placed or performed in visit on 03/27/15  . EKG 12-Lead    IMPRESSION AND PLAN:   A. fib with RVR Acute CHF Elevated troponin, possible due to A. fib and CHF. Dehydration Hypertension Hyperlipidemia Obesity  The patient will be admitted to stepdown unit. For A. fib, the patient was treated with the Cardizem 10 mg IV twice, digoxin 0.25 mg IV 1 dose, his heart rate is a still about 140-150. I will start a Cardizem drip and continue Eliquis. Follow-up Dr. Ubaldo Glassing. For acute CHF, I will give Lasix 40 mg IV twice a day, get a echocardiograph and start a CHF protocol. Follow-up BMP, magnesium level. I will hold Diovan at this time due to low side blood pressure.  All the records are reviewed and case discussed with ED provider. Management plans discussed with the patient, the patient's wife and daughter and they are in agreement.  CODE STATUS: Full code  TOTAL CRITICAL TIME TAKING CARE OF THIS PATIENT: 68 minutes.    Demetrios Loll M.D on 04/12/2015 at 6:00 PM  Between 7am to 6pm - Pager - [phone]  After 6pm  go to [email or web address] - [email or web address] [facility]  [facility]  Office  [phone]  CC: Primary care physician; Vernie Murders, PA

## 2015-04-12 NOTE — ED Notes (Signed)
SOB for past 3 days. Chest pain heart Palpations for past couple of weeks currently being treated for Afib. Per pt.

## 2015-04-12 NOTE — ED Notes (Signed)
O2 @ 2L/min/[place] STARTED.

## 2015-04-12 NOTE — ED Provider Notes (Signed)
[facility] Emergency Department Provider Note  ____________________________________________  Time seen: Approximately 3:41 PM  I have reviewed the triage vital signs and the nursing notes.   HISTORY  Chief Complaint Chest Pain and Shortness of Breath    HPI Robert Grant is a 59 y.o. male been experiencing shortness of breath with walking, also short of breath with trying to lay flat at night, and frequent palpitations off and on for the last 3-4 days. Occasionally experiencing some slight chest pressure, none presently but is expressing palpitations. Nonradiating, substernal chest pressure off and on.  Modifying factors include a recent diagnosis of atrial fibrillation on anticoagulation and diltiazem.  Severity mild to moderate dyspnea with walking. Context includes recent workup for atrial fibrillation and supposed to have a stress test on Tuesday.   No past medical history on file.  Patient Active Problem List   Diagnosis Date Noted  . Compulsive tobacco user syndrome 02/14/2015  . Chest pain 02/14/2015  . Annual physical exam 02/14/2015  . Closed fracture of nasal bones 02/06/2015  . Dizziness 02/06/2015  . Hypertriglyceridemia 02/06/2015  . Basal cell papilloma 02/06/2015  . Feeling stressed out 02/06/2015  . Snores 02/06/2015  . H/O cardiac catheterization 05/16/2014  . Awareness of heartbeats 04/19/2014  . Breath shortness 04/02/2014  . Decreased potassium in the blood 10/16/2007  . Essential (primary) hypertension 11/28/2006  . HLD (hyperlipidemia) 11/28/2006    Past Surgical History  Procedure Laterality Date  . Tonsillectomy and adenoidectomy  1960  . Knee surgery Right 2006  . Colectomy  07/2011    Current Outpatient Rx  Name  Route  Sig  Dispense  Refill  . amLODipine (NORVASC) 10 MG tablet   Oral   Take 1 tablet by mouth daily.         Marland Kitchen aspirin EC 81 MG tablet   Oral   Take 1 tablet by mouth daily.         .  potassium chloride (KLOR-CON 10) 10 MEQ tablet   Oral   Take 1 tablet (10 mEq total) by mouth daily.   30 tablet   0   . valsartan (DIOVAN) 160 MG tablet   Oral   Take 1 tablet by mouth daily.           Allergies Niacin  Family History  Problem Relation Age of Onset  . Diabetes Mother   . Hypertension Mother   . Kidney disease Mother   . Hyperlipidemia Mother   . Lung cancer Father   . Congestive Heart Failure Maternal Grandmother   . Congestive Heart Failure Paternal Grandmother     Social History Social History  Substance Use Topics  . Smoking status: Former Smoker -- 3.00 packs/day for 11 years    Types: Cigarettes  . Smokeless tobacco: Not on file  . Alcohol Use: No    Review of Systems Constitutional: No fever/chills Eyes: No visual changes. ENT: No sore throat. Cardiovascular: See history of present illness Respiratory: See history of present illness Gastrointestinal: No abdominal pain.  No nausea, no vomiting.  No diarrhea.  No constipation. Genitourinary: Negative for dysuria. Musculoskeletal: Negative for back pain. Skin: Negative for rash. Neurological: Negative for headaches, focal weakness or numbness.  10-point ROS otherwise negative.  ____________________________________________   PHYSICAL EXAM:  VITAL SIGNS: ED Triage Vitals  Enc Vitals Group     BP 04/12/15 1506 158/121 mmHg     Pulse Rate 04/12/15 1506 72     Resp 04/12/15  1506 24     Temp 04/12/15 1506 98.3 F (36.8 C)     Temp Source 04/12/15 1506 Oral     SpO2 04/12/15 1506 93 %     Weight 04/12/15 1506 240 lb (108.863 kg)     Height 04/12/15 1506 5\' 9"  (1.753 m)     Head Cir --      Peak Flow --      Pain Score 04/12/15 1505 6     Pain Loc --      Pain Edu? --      Excl. in [place]? --     Constitutional: Alert and oriented. Fatigued appearing and in no acute distress. Eyes: Conjunctivae are normal. PERRL. EOMI. Head: Atraumatic. Nose: No  congestion/rhinnorhea. Mouth/Throat: Mucous membranes are moist.  Oropharynx non-erythematous. Neck: No stridor.   Cardiovascular: Tachycardia with irregular rhythm. Grossly normal heart sounds.  Good peripheral circulation. Respiratory: Normal respiratory effort.  No retractions. Slight crackles in the bases bilaterally. Speaking in full sentences but minimal tachypnea noted. Gastrointestinal: Soft and nontender. No distention. No abdominal bruits. No CVA tenderness. Musculoskeletal: No lower extremity tenderness nor edema.  No joint effusions. Neurologic:  Normal speech and language. No gross focal neurologic deficits are appreciated. No gait instability. Skin:  Skin is warm, dry and intact. No rash noted. Psychiatric: Mood and affect are normal. Speech and behavior are normal.  ____________________________________________   LABS (all labs ordered are listed, but only abnormal results are displayed)  Labs Reviewed  CBC - Abnormal; Notable for the following:    WBC 11.9 (*)    All other components within normal limits  COMPREHENSIVE METABOLIC PANEL - Abnormal; Notable for the following:    Glucose, Bld 143 (*)    BUN 24 (*)    All other components within normal limits  TROPONIN I - Abnormal; Notable for the following:    Troponin I 0.05 (*)    All other components within normal limits  BRAIN NATRIURETIC PEPTIDE - Abnormal; Notable for the following:    B Natriuretic Peptide 493.0 (*)    All other components within normal limits   ____________________________________________  EKG  Reviewed and interpreted by me Ventricular rate 170 Atrial fibrillation with rapid ventricular response without ischemic changes noted QRS 102 QTC 500 Reviewed and interpreted by me as atrial fibrillation with rapid ventricular response without obvious ischemic change. Occasional PVC. ____________________________________________  [place] Chest Port 1 View (Final result) Result time:  04/12/15 15:58:38   Final result by Rad Results In Interface (04/12/15 15:58:38)   Narrative:   CLINICAL DATA: 59 year old male with 3 days of shortness of breath, palpitations. Atrial fibrillation. Former smoker. Initial encounter.  EXAM: PORTABLE CHEST - 1 VIEW  COMPARISON: 01/23/1996 chest radiograph report (no images available).  FINDINGS: Portable AP upright view at 1548 hrs. Cardiomegaly. Other mediastinal contours are within normal limits. Visualized tracheal air column is within normal limits. No pneumothorax, pleural effusion or consolidation. Increased interstitial markings with basilar predominance.  IMPRESSION: Cardiomegaly with increased interstitial markings suggesting mild or developing interstitial edema in this setting.     ____________________________________________   PROCEDURES  Procedure(s) performed: None  Critical Care performed: Yes, see critical care note(s)  CRITICAL CARE Performed by: Delman Kitten   Total critical care time: 40  Critical care time was exclusive of separately billable procedures and treating other patients.  Critical care was necessary to treat or prevent imminent or life-threatening deterioration.  Critical care was time spent personally by me on  the following activities: development of treatment plan with patient and/or surrogate as well as nursing, discussions with consultants, evaluation of patient's response to treatment, examination of patient, obtaining history from patient or surrogate, ordering and performing treatments and interventions, ordering and review of laboratory studies, ordering and review of radiographic studies, pulse oximetry and re-evaluation of patient's condition.  Patient presents with a-fibrillation rapid ventricular response requiring treatment with multiple IV anti-arrhythmics for rate control. ____________________________________________   INITIAL IMPRESSION / ASSESSMENT AND PLAN / ED  COURSE  Pertinent labs & imaging results that were available during my care of the patient were reviewed by me and considered in my medical decision making (see chart for details).  Patient returns with dyspnea, palpitations. has atrial fibrillation with rapid response. suspect for congestive heart failure given his dyspnea and orthopnea. EKG does not demonstrate acute ischemia. We'll give IV rate control with diltiazem, obtain chest x-ray, cardiac labs and continue to monitor. Based on his history I do not believe he is currently expressing acute ischemia.  ----------------------------------------- 4:51 PM on 04/12/2015 -----------------------------------------  Reviewed presentation, 12-lead, vital signs, and clinical course with Dr. Ubaldo Glassing. Recommends bloating with 0.25 mg digoxin, low dose of 20 mg Lasix for diuresis, and admission to hospital for ongoing care. I'm agreeable with this plan and discussed with the patient is agreeable with admission as we continued to work to rate control him as well as initiate diuresis for what appears to be mild to moderate congestive heart failure.  Patient with great improvement, still tachycardic to about 120 but improved. Reports feeling improved. In no distress, we will initiate digoxin loading and light diuresis. Discussed with the hospitalist service who will admit the patient for ongoing management. ____________________________________________   FINAL CLINICAL IMPRESSION(S) / ED DIAGNOSES  Final diagnoses:  Congestive heart failure, unspecified congestive heart failure chronicity, unspecified congestive heart failure type  Atrial fibrillation with rapid ventricular response      Delman Kitten, MD 04/12/15 1702

## 2015-04-22 DIAGNOSIS — I429 Cardiomyopathy, unspecified: Secondary | ICD-10-CM | POA: Insufficient documentation

## 2015-04-28 DIAGNOSIS — I509 Heart failure, unspecified: Secondary | ICD-10-CM | POA: Insufficient documentation

## 2015-04-28 DIAGNOSIS — I5042 Chronic combined systolic (congestive) and diastolic (congestive) heart failure: Secondary | ICD-10-CM | POA: Insufficient documentation

## 2015-05-02 ENCOUNTER — Encounter: Payer: Self-pay | Admitting: Family Medicine

## 2015-06-03 ENCOUNTER — Encounter: Payer: Self-pay | Admitting: Family Medicine

## 2015-06-20 ENCOUNTER — Encounter: Payer: Self-pay | Admitting: Family Medicine

## 2015-06-20 ENCOUNTER — Ambulatory Visit (INDEPENDENT_AMBULATORY_CARE_PROVIDER_SITE_OTHER): Payer: 59 | Admitting: Family Medicine

## 2015-06-20 VITALS — BP 132/74 | HR 79 | Temp 98.0°F | Resp 16 | Wt 225.0 lb

## 2015-06-20 DIAGNOSIS — I480 Paroxysmal atrial fibrillation: Secondary | ICD-10-CM

## 2015-06-20 DIAGNOSIS — J069 Acute upper respiratory infection, unspecified: Secondary | ICD-10-CM

## 2015-06-20 DIAGNOSIS — I5043 Acute on chronic combined systolic (congestive) and diastolic (congestive) heart failure: Secondary | ICD-10-CM | POA: Diagnosis not present

## 2015-06-20 MED ORDER — HYDROCOD POLST-CPM POLST ER 10-8 MG/5ML PO SUER: 5.0000 mL | Freq: Two times a day (BID) | ORAL | Status: DC | PRN

## 2015-06-20 MED ORDER — AMOXICILLIN 875 MG PO TABS: 875.0000 mg | ORAL_TABLET | Freq: Two times a day (BID) | ORAL | Status: DC

## 2015-06-20 NOTE — Progress Notes (Signed)
Patient ID: CHUN SELLEN, male   DOB: 09/04/1955, 59 y.o.   MRN: [id number] Name: Robert Grant   MRN: [id number]    DOB: [birth year]   Date:06/20/2015       Progress Note  Subjective  Chief Complaint  Chief Complaint  Patient presents with  . URI   URI  This is a new problem. The current episode started in the past 7 days. There has been no fever. Associated symptoms include congestion, coughing and sinus pain. Associated symptoms comments: Clear to white sputum with cough at night. Cough very ticklish at night and disturbs sleep.. Treatments tried: Trying daytime and nighttime cough and cold OTC medication. The treatment provided mild relief.   Congestive Heart Failure Was hospitalized at [facility] for increasing dyspnea and atrial fibrillation with EF 30% on 04-13-15. Took two cardioversions with Amiodarone to convert atrial fib with rapid ventricular response. Cardiac cath showed 1 vessel disease of 20% in the proximal LAD. Has follow up appointment with Dr. Gus Puma (cardiologist) at [facility] on 07-08-15.  Past Medical History  Diagnosis Date  . A-fib ([place])   . Hypertension     Social History  Substance Use Topics  . Smoking status: Former Smoker -- 3.00 packs/day for 11 years    Types: Cigarettes  . Smokeless tobacco: Never Used  . Alcohol Use: No    Current outpatient prescriptions:  .  amiodarone (PACERONE) 200 MG tablet, TAKE 1 TABLET (200 MG TOTAL) BY MOUTH ONCE DAILY., Disp: , Rfl: 11 .  apixaban (ELIQUIS) 5 MG TABS tablet, Take 5 mg by mouth 2 (two) times daily., Disp: , Rfl:  .  atorvastatin (LIPITOR) 40 MG tablet, TAKE 1 TABLET (40 MG TOTAL) BY MOUTH ONCE DAILY., Disp: , Rfl: 11 .  lisinopril (PRINIVIL,ZESTRIL) 5 MG tablet, TAKE 1 TABLET (5 MG TOTAL) BY MOUTH ONCE DAILY., Disp: , Rfl: 11 .  metoprolol succinate (TOPROL-XL) 25 MG 24 hr tablet, TAKE 1/2 TABLET BY MOUTH ONCE DAILY., Disp: , Rfl: 11 .  spironolactone (ALDACTONE) 25 MG tablet, TAKE 1 TABLET (25 MG TOTAL) BY MOUTH  ONCE DAILY., Disp: , Rfl: 11 .  torsemide (DEMADEX) 20 MG tablet, TAKE 2 TABLETS (40 MG TOTAL) BY MOUTH ONCE DAILY., Disp: , Rfl: 11  Allergies  Allergen Reactions  . Niacin Hives   Review of Systems  Constitutional: Negative.   HENT: Positive for congestion.   Eyes: Negative.   Respiratory: Positive for cough.   Cardiovascular: Negative.   Gastrointestinal: Negative.   Genitourinary: Negative.   Musculoskeletal: Negative.   Skin: Negative.   Neurological: Negative.   Endo/Heme/Allergies: Negative.   Psychiatric/Behavioral: Negative.    Objective  Filed Vitals:   06/20/15 0812  BP: 132/74  Pulse: 79  Temp: 98 F (36.7 C)  TempSrc: Oral  Resp: 16  Weight: 225 lb (102.059 kg)  SpO2: 98%   Physical Exam  Constitutional: He is oriented to person, place, and time and well-developed, well-nourished, and in no distress.  HENT:  Head: Normocephalic and atraumatic.  Right Ear: External ear normal.  Left Ear: External ear normal.  Slight soreness of maxillary sinuses and cobblestoning of posterior pharynx.  Eyes: Conjunctivae and EOM are normal.  Neck: Normal range of motion. Neck supple.  Cardiovascular: Normal rate and regular rhythm.   Pulmonary/Chest: Effort normal and breath sounds normal.  Abdominal: Soft. Bowel sounds are normal.  Neurological: He is alert and oriented to person, place, and time.  Psychiatric: Affect and judgment normal.    Recent  Results (from the past 2160 hour(s))  Comprehensive metabolic panel     Status: Abnormal   Collection Time: 03/27/15  9:29 AM  Result Value Ref Range   Glucose 134 (H) 65 - 99 mg/dL   BUN 11 8 - 27 mg/dL   Creatinine, Ser 0.84 0.76 - 1.27 mg/dL   GFR calc non Af Amer 95 >59 mL/min/1.73   GFR calc Af Amer 110 >59 mL/min/1.73   BUN/Creatinine Ratio 13 10 - 22   Sodium 142 134 - 144 mmol/L   Potassium 3.4 (L) 3.5 - 5.2 mmol/L   Chloride 93 (L) 97 - 108 mmol/L   CO2 26 18 - 29 mmol/L   Calcium 9.1 8.6 - 10.2 mg/dL    Total Protein 6.8 6.0 - 8.5 g/dL   Albumin 4.6 3.6 - 4.8 g/dL   Globulin, Total 2.2 1.5 - 4.5 g/dL   Albumin/Globulin Ratio 2.1 1.1 - 2.5   Bilirubin Total 0.8 0.0 - 1.2 mg/dL   Alkaline Phosphatase 62 39 - 117 IU/L   AST 24 0 - 40 IU/L   ALT 26 0 - 44 IU/L  CBC with Differential/Platelet     Status: Abnormal   Collection Time: 03/27/15  9:29 AM  Result Value Ref Range   WBC 9.0 3.4 - 10.8 x10E3/uL   RBC 5.03 4.14 - 5.80 x10E6/uL   Hemoglobin 14.9 12.6 - 17.7 g/dL   Hematocrit 44.0 37.5 - 51.0 %   MCV 88 79 - 97 fL   MCH 29.6 26.6 - 33.0 pg   MCHC 33.9 31.5 - 35.7 g/dL   RDW 14.6 12.3 - 15.4 %   Platelets 164 150 - 379 x10E3/uL   Neutrophils 49 %   Lymphs 41 %   Monocytes 7 %   Eos 3 %   Basos 0 %   Neutrophils Absolute 4.4 1.4 - 7.0 x10E3/uL   Lymphocytes Absolute 3.7 (H) 0.7 - 3.1 x10E3/uL   Monocytes Absolute 0.7 0.1 - 0.9 x10E3/uL   EOS (ABSOLUTE) 0.2 0.0 - 0.4 x10E3/uL   Basophils Absolute 0.0 0.0 - 0.2 x10E3/uL   Immature Granulocytes 0 %   Immature Grans (Abs) 0.0 0.0 - 0.1 x10E3/uL  Troponin I     Status: None   Collection Time: 03/27/15  9:29 AM  Result Value Ref Range   Troponin I 0.04 0.00 - 0.04 ng/mL    Comment: **Verified by repeat analysis**  CBC     Status: Abnormal   Collection Time: 04/12/15  3:24 PM  Result Value Ref Range   WBC 11.9 (H) 3.8 - 10.6 K/uL   RBC 5.03 4.40 - 5.90 MIL/uL   Hemoglobin 14.9 13.0 - 18.0 g/dL   HCT 44.3 40.0 - 52.0 %   MCV 88.1 80.0 - 100.0 fL   MCH 29.6 26.0 - 34.0 pg   MCHC 33.6 32.0 - 36.0 g/dL   RDW 14.4 11.5 - 14.5 %   Platelets 196 150 - 440 K/uL  Comprehensive metabolic panel     Status: Abnormal   Collection Time: 04/12/15  3:24 PM  Result Value Ref Range   Sodium 142 135 - 145 mmol/L   Potassium 3.5 3.5 - 5.1 mmol/L   Chloride 105 101 - 111 mmol/L   CO2 24 22 - 32 mmol/L   Glucose, Bld 143 (H) 65 - 99 mg/dL   BUN 24 (H) 6 - 20 mg/dL   Creatinine, Ser 1.13 0.61 - 1.24 mg/dL   Calcium 9.3 8.9 - 10.3 mg/dL  Total Protein 7.2 6.5 - 8.1 g/dL   Albumin 4.4 3.5 - 5.0 g/dL   AST 29 15 - 41 U/L   ALT 24 17 - 63 U/L   Alkaline Phosphatase 57 38 - 126 U/L   Total Bilirubin 0.9 0.3 - 1.2 mg/dL   GFR calc non Af Amer >60 >60 mL/min   GFR calc Af Amer >60 >60 mL/min    Comment: (NOTE) The eGFR has been calculated using the CKD EPI equation. This calculation has not been validated in all clinical situations. eGFR's persistently <60 mL/min signify possible Chronic Kidney Disease.    Anion gap 13 5 - 15  Troponin I     Status: Abnormal   Collection Time: 04/12/15  3:24 PM  Result Value Ref Range   Troponin I 0.05 (H) <0.031 ng/mL    Comment: READ BACK AND VERIFIED WITH HUNTER ORE ON 04/12/15 AT 1622 BY [facility]        PERSISTENTLY INCREASED TROPONIN VALUES IN THE RANGE OF 0.04-0.49 ng/mL CAN BE SEEN IN:       -UNSTABLE ANGINA       -CONGESTIVE HEART FAILURE       -MYOCARDITIS       -CHEST TRAUMA       -ARRYHTHMIAS       -LATE PRESENTING MYOCARDIAL INFARCTION       -COPD   CLINICAL FOLLOW-UP RECOMMENDED.   Brain natriuretic peptide     Status: Abnormal   Collection Time: 04/12/15  3:24 PM  Result Value Ref Range   B Natriuretic Peptide 493.0 (H) 0.0 - 100.0 pg/mL  Magnesium     Status: None   Collection Time: 04/12/15  3:24 PM  Result Value Ref Range   Magnesium 1.8 1.7 - 2.4 mg/dL   Assessment & Plan  1. Upper respiratory infection Recent onset with significant cough and sinus pressure. No fever. Will give cough syrup and antibiotic. Recheck in 1 week if no better. Aggressive treatment with recent cardiomyopathy and CHF diagnosis. - chlorpheniramine-HYDROcodone (TUSSIONEX PENNKINETIC ER) 10-8 MG/5ML SUER; Take 5 mLs by mouth every 12 (twelve) hours as needed for cough.  Dispense: 140 mL; Refill: 0 - amoxicillin (AMOXIL) 875 MG tablet; Take 1 tablet (875 mg total) by mouth 2 (two) times daily.  Dispense: 20 tablet; Refill: 0  2. Paroxysmal atrial fibrillation ([place]) Hospitalized at [facility]  around 04-13-15 and required cardioversion twice to convert rapid ventricular response. Continue follow up with Dr. Gus Puma (cardiologist) at [facility] on 07-08-15.  3. Acute on chronic combined systolic and diastolic heart failure ([place]) EF was 30% at [facility] on echocardiogram of 04-13-15. Has lost around 30 lbs of fluid and breathing much better. Was hospitalized for 11 days at [facility]. Cardiac cath showed only one vessel disease of 20% in the proximal LAD. Was given Flu shot and Pneumovax-23 on 05-27-15.

## 2015-07-31 DIAGNOSIS — Z95810 Presence of automatic (implantable) cardiac defibrillator: Secondary | ICD-10-CM

## 2015-07-31 HISTORY — DX: Presence of automatic (implantable) cardiac defibrillator: Z95.810

## 2015-08-22 HISTORY — PX: OTHER SURGICAL HISTORY: SHX169

## 2015-09-05 ENCOUNTER — Encounter: Payer: Self-pay | Admitting: Family Medicine

## 2015-09-05 ENCOUNTER — Ambulatory Visit (INDEPENDENT_AMBULATORY_CARE_PROVIDER_SITE_OTHER): Payer: 59 | Admitting: Family Medicine

## 2015-09-05 ENCOUNTER — Ambulatory Visit: Payer: Self-pay | Admitting: Family Medicine

## 2015-09-05 VITALS — BP 128/82 | HR 70 | Temp 98.0°F | Resp 16 | Wt 224.6 lb

## 2015-09-05 DIAGNOSIS — I5042 Chronic combined systolic (congestive) and diastolic (congestive) heart failure: Secondary | ICD-10-CM | POA: Diagnosis not present

## 2015-09-05 DIAGNOSIS — I429 Cardiomyopathy, unspecified: Secondary | ICD-10-CM | POA: Diagnosis not present

## 2015-09-05 DIAGNOSIS — I480 Paroxysmal atrial fibrillation: Secondary | ICD-10-CM

## 2015-09-05 NOTE — Progress Notes (Signed)
Patient ID: Robert Grant, male   DOB: 09-22-1955, 60 y.o.   MRN: [id number]   Patient: Robert Grant Male    DOB: 07-02-1956   60 y.o.   MRN: [id number] Visit Date: 09/05/2015  Today's Provider: Vernie Murders, PA   Chief Complaint  Patient presents with  . Congestive Heart Failure  . Follow-up   Subjective:    Congestive Heart Failure Presents for follow-up (Was hospitalized on 08-22-15 to 08-23-15 for implant of a defibrillator at [facility] by Dr. Janeece Riggers. due to chronic systolic CHF (with EF [id number] per echo), a.fib with rapid ventricular response and dilated cardiomyopathy. Placed in left upper chest.) visit.  Has not had any discharge of defibrillator, chest pain or palpitations.  Patient Active Problem List   Diagnosis Date Noted  . Heart failure ([place]) 04/28/2015  . Chronic combined systolic and diastolic heart failure ([place]) 04/28/2015  . Cardiomyopathy ([place]) 04/22/2015  . Acute CHF ([place]) 04/12/2015  . A-fib ([place]) 04/12/2015  . Beat, premature ventricular 03/31/2015  . Paroxysmal atrial fibrillation ([place]) 03/28/2015  . Compulsive tobacco user syndrome 02/14/2015  . Chest pain 02/14/2015  . Annual physical exam 02/14/2015  . Closed fracture of nasal bones 02/06/2015  . Dizziness 02/06/2015  . Hypertriglyceridemia 02/06/2015  . Basal cell papilloma 02/06/2015  . Feeling stressed out 02/06/2015  . Snores 02/06/2015  . H/O cardiac catheterization 05/16/2014  . Awareness of heartbeats 04/19/2014  . Breath shortness 04/02/2014  . Decreased potassium in the blood 10/16/2007  . Essential (primary) hypertension 11/28/2006  . HLD (hyperlipidemia) 11/28/2006   Past Surgical History  Procedure Laterality Date  . Tonsillectomy and adenoidectomy  1960  . Knee surgery Right 2006  . Colectomy  07/2011   Family History  Problem Relation Age of Onset  . Diabetes Mother   . Hypertension Mother   . Kidney disease Mother   . Hyperlipidemia Mother   . Lung cancer Father   . Congestive  Heart Failure Maternal Grandmother   . Congestive Heart Failure Paternal Grandmother    Allergies  Allergen Reactions  . Niacin Hives     Previous Medications   AMIODARONE (PACERONE) 200 MG TABLET    TAKE 1 TABLET (200 MG TOTAL) BY MOUTH ONCE DAILY.   APIXABAN (ELIQUIS) 5 MG TABS TABLET    Take 5 mg by mouth 2 (two) times daily.   ATORVASTATIN (LIPITOR) 40 MG TABLET    TAKE 1 TABLET (40 MG TOTAL) BY MOUTH ONCE DAILY.   LISINOPRIL (PRINIVIL,ZESTRIL) 5 MG TABLET    TAKE 1 TABLET (5 MG TOTAL) BY MOUTH ONCE DAILY.   METOPROLOL SUCCINATE (TOPROL-XL) 25 MG 24 HR TABLET    TAKE 1/2 TABLET BY MOUTH ONCE DAILY.   SPIRONOLACTONE (ALDACTONE) 25 MG TABLET    TAKE 1 TABLET (25 MG TOTAL) BY MOUTH ONCE DAILY.   TORSEMIDE (DEMADEX) 20 MG TABLET    TAKE 2 TABLETS (40 MG TOTAL) BY MOUTH ONCE DAILY.    Review of Systems  Constitutional: Negative.   HENT: Negative.   Eyes: Negative.   Respiratory: Negative.   Cardiovascular: Negative.   Gastrointestinal: Negative.   Endocrine: Negative.   Genitourinary: Negative.   Musculoskeletal: Negative.   Skin: Negative.   Allergic/Immunologic: Negative.   Neurological: Negative.   Psychiatric/Behavioral: Negative.     Social History  Substance Use Topics  . Smoking status: Former Smoker -- 3.00 packs/day for 11 years    Types: Cigarettes  . Smokeless tobacco: Never Used  . Alcohol Use: No  Objective:   BP 128/82 mmHg  Pulse 70  Temp(Src) 98 F (36.7 C) (Oral)  Resp 16  Wt 224 lb 9.6 oz (101.878 kg)  SpO2 97%  Physical Exam  Constitutional: He is oriented to person, place, and time. He appears well-developed and well-nourished. No distress.  HENT:  Head: Normocephalic and atraumatic.  Right Ear: Hearing normal.  Left Ear: Hearing normal.  Nose: Nose normal.  Eyes: Conjunctivae and lids are normal. Right eye exhibits no discharge. Left eye exhibits no discharge. No scleral icterus.  Cardiovascular: Normal rate and regular rhythm.     Pulmonary/Chest: Effort normal and breath sounds normal. No respiratory distress.  Still some soreness to skin over defibrillator in left upper chest. Incision healing well with steri-strips in place. No sign of infection.  Musculoskeletal: Normal range of motion.  Neurological: He is alert and oriented to person, place, and time.  Skin: Skin is intact. No lesion and no rash noted.  Psychiatric: He has a normal mood and affect. His speech is normal and behavior is normal. Thought content normal.      Assessment & Plan:     1. Chronic combined systolic and diastolic heart failure (HCC) History of EF 30% on echocardiogram 04-13-15. Had a defibrillator implanted on 08-22-15 at [facility] by Dr. Janeece Riggers due to dilated cardiomyopathy and atrial fibrillation with rapid ventricular response. Will continue follow up with cardiologist 09-29-15.  2. Cardiomyopathy ([place]) (see above)  3. Paroxysmal atrial fibrillation (HCC) (see above)

## 2015-09-26 ENCOUNTER — Ambulatory Visit (INDEPENDENT_AMBULATORY_CARE_PROVIDER_SITE_OTHER): Payer: 59 | Admitting: Family Medicine

## 2015-09-26 ENCOUNTER — Encounter: Payer: Self-pay | Admitting: Family Medicine

## 2015-09-26 VITALS — BP 124/70 | HR 78 | Temp 98.4°F | Resp 16 | Wt 229.6 lb

## 2015-09-26 DIAGNOSIS — J069 Acute upper respiratory infection, unspecified: Secondary | ICD-10-CM | POA: Diagnosis not present

## 2015-09-26 MED ORDER — AMOXICILLIN-POT CLAVULANATE 875-125 MG PO TABS: 1.0000 | ORAL_TABLET | Freq: Two times a day (BID) | ORAL | Status: DC

## 2015-09-26 NOTE — Progress Notes (Signed)
Subjective:     Patient ID: Robert Grant, male   DOB: Aug 03, 1956, 60 y.o.   MRN: [id number]  HPI  Chief Complaint  Patient presents with  . Sinus Problem    Patient comes in office today with concerns of sinus pain and preszsure for the past 5 days. Patient reports the following associated symptoms: ringing in the ears, watery eyes, cough/sneezing, runny nose and post nasal drip  States sx started with headache, no sore throat. Sinus drainage is clear.   Review of Systems     Objective:   Physical Exam  Constitutional: He appears well-developed and well-nourished. No distress.  Ears: T.M's intact without inflammation Sinuses: mild maxillary sinus tenderness Throat: no tonsillar enlargement or exudate Neck: left anterior cervical node present Lungs: clear     Assessment:    1. Upper respiratory infection - amoxicillin-clavulanate (AUGMENTIN) 875-125 MG tablet; Take 1 tablet by mouth 2 (two) times daily.  Dispense: 20 tablet; Refill: 0    Plan:    Discussed otc medication that would not affect heart rate. To start abx if sinuses not improving over the next 2-3 days

## 2015-09-26 NOTE — Patient Instructions (Signed)
Try saline nasal spray for congestion Claritin for nasal drip, and Delsym for cough. If sinuses not improving over the next two-3 days start the antibiotic.

## 2015-12-23 DIAGNOSIS — Z95810 Presence of automatic (implantable) cardiac defibrillator: Secondary | ICD-10-CM | POA: Insufficient documentation

## 2016-01-21 DIAGNOSIS — G4733 Obstructive sleep apnea (adult) (pediatric): Secondary | ICD-10-CM | POA: Insufficient documentation

## 2016-02-04 ENCOUNTER — Encounter: Payer: Self-pay | Admitting: Family Medicine

## 2016-06-08 ENCOUNTER — Ambulatory Visit (INDEPENDENT_AMBULATORY_CARE_PROVIDER_SITE_OTHER): Payer: 59 | Admitting: Family Medicine

## 2016-06-08 ENCOUNTER — Encounter: Payer: Self-pay | Admitting: Family Medicine

## 2016-06-08 VITALS — BP 180/98 | HR 65 | Temp 98.2°F | Resp 16 | Ht 68.25 in | Wt 238.0 lb

## 2016-06-08 DIAGNOSIS — E669 Obesity, unspecified: Secondary | ICD-10-CM

## 2016-06-08 DIAGNOSIS — Z114 Encounter for screening for human immunodeficiency virus [HIV]: Secondary | ICD-10-CM

## 2016-06-08 DIAGNOSIS — I480 Paroxysmal atrial fibrillation: Secondary | ICD-10-CM | POA: Diagnosis not present

## 2016-06-08 DIAGNOSIS — Z0000 Encounter for general adult medical examination without abnormal findings: Secondary | ICD-10-CM | POA: Diagnosis not present

## 2016-06-08 DIAGNOSIS — R3129 Other microscopic hematuria: Secondary | ICD-10-CM | POA: Diagnosis not present

## 2016-06-08 DIAGNOSIS — I5042 Chronic combined systolic (congestive) and diastolic (congestive) heart failure: Secondary | ICD-10-CM

## 2016-06-08 DIAGNOSIS — Z95810 Presence of automatic (implantable) cardiac defibrillator: Secondary | ICD-10-CM | POA: Diagnosis not present

## 2016-06-08 DIAGNOSIS — Z23 Encounter for immunization: Secondary | ICD-10-CM | POA: Diagnosis not present

## 2016-06-08 DIAGNOSIS — I10 Essential (primary) hypertension: Secondary | ICD-10-CM

## 2016-06-08 DIAGNOSIS — IMO0001 Reserved for inherently not codable concepts without codable children: Secondary | ICD-10-CM

## 2016-06-08 LAB — POCT URINALYSIS DIPSTICK
Bilirubin, UA: NEGATIVE
Glucose, UA: NEGATIVE
Ketones, UA: NEGATIVE
Leukocytes, UA: NEGATIVE
Nitrite, UA: NEGATIVE
Protein, UA: NEGATIVE
Spec Grav, UA: 1.02
Urobilinogen, UA: 0.2
pH, UA: 6

## 2016-06-08 NOTE — Progress Notes (Signed)
Patient: Robert Grant, Male    DOB: 1955-11-29, 60 y.o.   MRN: [id number] Visit Date: 06/08/2016  Today's Provider: Vernie Murders, PA   Chief Complaint  Patient presents with  . Annual Exam   Subjective:    Annual physical exam Robert Grant is a 60 y.o. male who presents today for health maintenance and complete physical. He feels fairly well. He reports exercising none only working. He reports he is sleeping average 4-5 hours per night.  -----------------------------------------------------------------  Review of Systems  Constitutional: Positive for activity change and fatigue.  HENT: Positive for hearing loss and tinnitus.   Eyes: Negative.   Respiratory: Negative.   Cardiovascular: Negative.   Gastrointestinal: Negative.   Endocrine: Positive for cold intolerance, heat intolerance, polydipsia and polyuria.  Genitourinary: Positive for frequency.  Musculoskeletal: Negative.   Skin: Negative.   Allergic/Immunologic: Negative.   Neurological: Positive for light-headedness and headaches.  Hematological: Negative.   Psychiatric/Behavioral: Negative.    Social History      He  reports that he has quit smoking. His smoking use included Cigarettes. He has a 33.00 pack-year smoking history. He has never used smokeless tobacco. He reports that he does not drink alcohol or use drugs.       Social History   Social History  . Marital status: Married    Spouse name: N/A  . Number of children: N/A  . Years of education: N/A   Social History Main Topics  . Smoking status: Former Smoker    Packs/day: 3.00    Years: 11.00    Types: Cigarettes  . Smokeless tobacco: Never Used  . Alcohol use No  . Drug use: No  . Sexual activity: Yes    Birth control/ protection: None   Other Topics Concern  . None   Social History Narrative  . None   Past Medical History:  Diagnosis Date  . A-fib ([place])   . Hypertension    Patient Active Problem List   Diagnosis  Date Noted  . Obstructive sleep apnea 01/21/2016  . Automatic implantable cardioverter-defibrillator in situ 12/23/2015  . Heart failure ([place]) 04/28/2015  . Chronic combined systolic and diastolic heart failure ([place]) 04/28/2015  . Cardiomyopathy ([place]) 04/22/2015  . Acute CHF ([place]) 04/12/2015  . A-fib ([place]) 04/12/2015  . Beat, premature ventricular 03/31/2015  . Paroxysmal atrial fibrillation ([place]) 03/28/2015  . Compulsive tobacco user syndrome 02/14/2015  . Chest pain 02/14/2015  . Annual physical exam 02/14/2015  . Closed fracture of nasal bones 02/06/2015  . Dizziness 02/06/2015  . Hypertriglyceridemia 02/06/2015  . Basal cell papilloma 02/06/2015  . Feeling stressed out 02/06/2015  . Snores 02/06/2015  . H/O cardiac catheterization 05/16/2014  . Awareness of heartbeats 04/19/2014  . Breath shortness 04/02/2014  . Decreased potassium in the blood 10/16/2007  . Essential (primary) hypertension 11/28/2006  . HLD (hyperlipidemia) 11/28/2006    Past Surgical History:  Procedure Laterality Date  . COLECTOMY  07/2011  . Implant of defibrillator Left 08-22-15  . KNEE SURGERY Right 2006  . TONSILLECTOMY AND ADENOIDECTOMY  1960    Family History        Family Status  Relation Status  . Mother Deceased  . Father Deceased  . Maternal Grandmother Deceased  . Maternal Grandfather Deceased  . Paternal Grandmother Deceased  . Paternal Grandfather Deceased        His family history includes Congestive Heart Failure in his maternal grandmother and paternal grandmother; Diabetes in  his mother; Hyperlipidemia in his mother; Hypertension in his mother; Kidney disease in his mother; Lung cancer in his father.    Allergies  Allergen Reactions  . Niacin Hives    Current Meds  Medication Sig  . amiodarone (PACERONE) 200 MG tablet TAKE 1 TABLET (200 MG TOTAL) BY MOUTH ONCE DAILY.  Marland Kitchen amoxicillin-clavulanate (AUGMENTIN) 875-125 MG tablet Take 1 tablet by mouth 2 (two) times daily.  Marland Kitchen  apixaban (ELIQUIS) 5 MG TABS tablet Take 5 mg by mouth 2 (two) times daily.  Marland Kitchen atorvastatin (LIPITOR) 40 MG tablet TAKE 1 TABLET (40 MG TOTAL) BY MOUTH ONCE DAILY.  . carvedilol (COREG) 12.5 MG tablet Take 1 tablet 2 times daily by mouth.  Marland Kitchen lisinopril (PRINIVIL,ZESTRIL) 5 MG tablet TAKE 1 TABLET (5 MG TOTAL) BY MOUTH TWICE DAILY.  . metoprolol succinate (TOPROL-XL) 25 MG 24 hr tablet TAKE 1/2 TABLET BY MOUTH ONCE DAILY.  Marland Kitchen spironolactone (ALDACTONE) 25 MG tablet TAKE 1 TABLET (25 MG TOTAL) BY MOUTH ONCE DAILY.  Marland Kitchen torsemide (DEMADEX) 20 MG tablet TAKE 2 TABLETS (40 MG TOTAL) BY MOUTH ONCE DAILY.    Patient Care Team: Margo Common, PA as PCP - General (Physician Assistant)     Objective:   Vitals: BP (!) 180/98 (BP Location: Right Arm, Patient Position: Sitting, Cuff Size: Large)   Pulse 65   Temp 98.2 F (36.8 C) (Oral)   Resp 16   Ht 5' 8.25" (1.734 m)   Wt 238 lb (108 kg)   BMI 35.92 kg/m   Wt Readings from Last 3 Encounters:  06/08/16 238 lb (108 kg)  09/26/15 229 lb 9.6 oz (104.1 kg)  09/05/15 224 lb 9.6 oz (101.9 kg)    Physical Exam  Constitutional: He is oriented to person, place, and time. He appears well-developed and well-nourished.  HENT:  Head: Normocephalic and atraumatic.  Right Ear: External ear normal.  Left Ear: External ear normal.  Nose: Nose normal.  Mouth/Throat: Oropharynx is clear and moist.  Eyes: Conjunctivae and EOM are normal. Pupils are equal, round, and reactive to light. Right eye exhibits no discharge.  Neck: Normal range of motion. Neck supple. No tracheal deviation present. No thyromegaly present.  Cardiovascular: Normal rate, regular rhythm, normal heart sounds and intact distal pulses.   No murmur heard. Faint Grade I systolic murmur.  Pulmonary/Chest: Effort normal and breath sounds normal. No respiratory distress. He has no wheezes. He has no rales. He exhibits no tenderness.  Abdominal: Soft. Bowel sounds are normal. He exhibits  no distension and no mass. There is no tenderness. There is no rebound and no guarding.  Genitourinary: Rectum normal and penis normal. Rectal exam shows guaiac negative stool.  Genitourinary Comments: No prostate nodules. Questionable enlargement.  Musculoskeletal: Normal range of motion. He exhibits no edema or tenderness.  Lymphadenopathy:    He has no cervical adenopathy.  Neurological: He is alert and oriented to person, place, and time. He has normal reflexes. No cranial nerve deficit. He exhibits normal muscle tone. Coordination normal.  Normal sensation in feet to test with nylon string.  Skin: Skin is warm and dry. No rash noted. No erythema.  Psychiatric: He has a normal mood and affect. His behavior is normal. Judgment and thought content normal.     Depression Screen PHQ 2/9 Scores 02/14/2015  PHQ - 2 Score 1   Assessment & Plan:     Routine Health Maintenance and Physical Exam  Exercise Activities and Dietary recommendations Goals  Not much energy for regular exercise since heart problems started.      Immunization History  Administered Date(s) Administered  . Tdap 02/13/2008  . Zoster 06/26/2014    Health Maintenance  Topic Date Due  . Hepatitis C Screening  02[birth year]/1954  . HIV Screening  03/30/1969  . COLONOSCOPY  03/30/2004  . INFLUENZA VACCINE  03/30/2016  . TETANUS/TDAP  02/12/2018  . ZOSTAVAX  Completed      Discussed health benefits of physical activity, and encouraged him to engage in regular exercise appropriate for his age and condition.    -------------------------------------------------------------------- 1. Annual physical exam General health stable. Will get routine labs and follow up pending reports. - POCT Urinalysis Dipstick  2. Chronic combined systolic and diastolic heart failure (HCC) Has to sleep with head and shoulders elevated or he gets short of breath. No edema today. No JVD recognized. Increase Demadex back to the 20 mg BID as  cardiologist advised. Recheck labs and follow up with cardiologist next Tuesday. - CBC with Differential/Platelet - Comprehensive metabolic panel - Lipid panel - TSH  3. Essential (primary) hypertension Elevated BP. Still taking Coreg 12.5 mg BID, Lisinopril 5 mg BID, Spironolactone 25 mg qd and Metoprolol-XR 25 mg qd. May use Demadex 20 mg BID now as cardiologist recommended for CHF and hypertension. Recheck with cardiologist next week as planned. Recheck labs. - CBC with Differential/Platelet - Comprehensive metabolic panel - Lipid panel - TSH  4. Paroxysmal atrial fibrillation (HCC) Stable rhythm today.   5. Automatic implantable cardioverter-defibrillator in situ Stable in left upper chest. No defib activity recognized by patient. Follow up regularly with cardiologist.  6. Obesity, Class II, BMI 35-39.9, with comorbidity Has gained 9 lbs in the past year. Questionable increase in fluid retention. May follow low fat diet, increase exercise level and proceed with using Demadex 20 mg BID as recommended by cardiologist. Check labs for metabolic disorder. - CBC with Differential/Platelet - Comprehensive metabolic panel - Hemoglobin A1c - Lipid panel - TSH  7. Need for influenza vaccination - Flu Vaccine QUAD 36+ mos PF IM (Fluarix & Fluzone Quad PF)  8. Screening for HIV (human immunodeficiency virus) - HIV antibody  9. Hematuria, microscopic Some nocturia once or twice with decreased stream force. Has a history of kidney stones in the past. No significant cells or crystals on microscopic examination. No pain or discomfort today. Will check PSA and encouraged to increase fluid intake. - PSA   Vernie Murders, PA  [facility]

## 2016-06-10 LAB — COMPREHENSIVE METABOLIC PANEL
ALT: 28 IU/L (ref 0–44)
AST: 24 IU/L (ref 0–40)
Albumin/Globulin Ratio: 2 (ref 1.2–2.2)
Albumin: 4.8 g/dL (ref 3.6–4.8)
Alkaline Phosphatase: 73 IU/L (ref 39–117)
BUN/Creatinine Ratio: 16 (ref 10–24)
BUN: 19 mg/dL (ref 8–27)
Bilirubin Total: 0.7 mg/dL (ref 0.0–1.2)
CO2: 28 mmol/L (ref 18–29)
Calcium: 9.2 mg/dL (ref 8.6–10.2)
Chloride: 97 mmol/L (ref 96–106)
Creatinine, Ser: 1.19 mg/dL (ref 0.76–1.27)
GFR calc Af Amer: 75 mL/min/{1.73_m2} (ref >59)
GFR calc non Af Amer: 65 mL/min/{1.73_m2} (ref >59)
Globulin, Total: 2.4 g/dL (ref 1.5–4.5)
Glucose: 153 mg/dL — ABNORMAL HIGH (ref 65–99)
Potassium: 3.8 mmol/L (ref 3.5–5.2)
Sodium: 141 mmol/L (ref 134–144)
Total Protein: 7.2 g/dL (ref 6.0–8.5)

## 2016-06-10 LAB — CBC WITH DIFFERENTIAL/PLATELET
Basophils Absolute: 0 10*3/uL (ref 0.0–0.2)
Basos: 1 %
EOS (ABSOLUTE): 0.4 10*3/uL (ref 0.0–0.4)
Eos: 6 %
Hematocrit: 40.6 % (ref 37.5–51.0)
Hemoglobin: 13.6 g/dL (ref 12.6–17.7)
Immature Grans (Abs): 0 10*3/uL (ref 0.0–0.1)
Immature Granulocytes: 0 %
Lymphocytes Absolute: 2.7 10*3/uL (ref 0.7–3.1)
Lymphs: 35 %
MCH: 29.2 pg (ref 26.6–33.0)
MCHC: 33.5 g/dL (ref 31.5–35.7)
MCV: 87 fL (ref 79–97)
Monocytes Absolute: 0.6 10*3/uL (ref 0.1–0.9)
Monocytes: 8 %
Neutrophils Absolute: 3.9 10*3/uL (ref 1.4–7.0)
Neutrophils: 50 %
Platelets: 170 10*3/uL (ref 150–379)
RBC: 4.65 x10E6/uL (ref 4.14–5.80)
RDW: 14.1 % (ref 12.3–15.4)
WBC: 7.7 10*3/uL (ref 3.4–10.8)

## 2016-06-10 LAB — LIPID PANEL
Chol/HDL Ratio: 6.6 ratio units — ABNORMAL HIGH (ref 0.0–5.0)
Cholesterol, Total: 172 mg/dL (ref 100–199)
HDL: 26 mg/dL — ABNORMAL LOW (ref >39)
Triglycerides: 703 mg/dL (ref 0–149)

## 2016-06-10 LAB — HIV ANTIBODY (ROUTINE TESTING W REFLEX): HIV Screen 4th Generation wRfx: NONREACTIVE

## 2016-06-10 LAB — PSA: Prostate Specific Ag, Serum: 0.9 ng/mL (ref 0.0–4.0)

## 2016-06-10 LAB — HEMOGLOBIN A1C
Est. average glucose Bld gHb Est-mCnc: 151 mg/dL
Hgb A1c MFr Bld: 6.9 % — ABNORMAL HIGH (ref 4.8–5.6)

## 2016-06-10 LAB — TSH: TSH: 2.2 u[IU]/mL (ref 0.450–4.500)

## 2016-06-11 ENCOUNTER — Other Ambulatory Visit: Payer: Self-pay | Admitting: Family Medicine

## 2016-06-11 ENCOUNTER — Telehealth: Payer: Self-pay

## 2016-06-11 MED ORDER — COLESEVELAM HCL 3.75 G PO PACK: 3.7500 g | PACK | Freq: Every day | ORAL | 3 refills | Status: DC

## 2016-06-11 NOTE — Telephone Encounter (Signed)
-----   Message from Margo Common, [place] sent at 06/11/2016  1:40 PM EDT ----- Triglycerides still very high and blood sugar higher. Remainder of tests are normal. Recommend weight loss, low fat diet, regular exercise 30 minutes a day and Welchol 3.75 gm packets in 4-6 ounces of water daily #30 & 3 RF. Recheck progress in 3 months.

## 2016-06-11 NOTE — Telephone Encounter (Signed)
Pt advised and medication sent in. Emily Drozdowski, CMA  

## 2016-09-25 ENCOUNTER — Encounter: Payer: Self-pay | Admitting: Family Medicine

## 2016-10-07 ENCOUNTER — Encounter: Payer: Self-pay | Admitting: Family Medicine

## 2016-11-11 ENCOUNTER — Telehealth: Payer: Self-pay | Admitting: Family Medicine

## 2016-11-11 NOTE — Telephone Encounter (Signed)
[facility] called to scheduled pt for hospital f/u. Pt is being discharged today and was treated for AKI. They are faxing pt's discharge summary and is scheduled for f/u on 11/16/16 @10  am. Thanks TNP

## 2016-11-11 NOTE — Telephone Encounter (Signed)
Noted. Will await discharge summary. -MM

## 2016-11-12 NOTE — Telephone Encounter (Signed)
Transition Care Management Follow-up Telephone Call    Date discharged? 11/11/16  How have you been since you were released from the hospital? Still has some nausea and dizziness, but not as bad as it was. Pt has not picked up his Zofran yet but plans to this weekend. Overall, pt states he is recovering.  Any patient concerns? How well his kidney function is currently.    Items Reviewed:  Medications reviewed: Yes  Allergies reviewed: Yes  Dietary changes reviewed: Increase water consumption, low sodium diet.  Referrals reviewed: N/A   Functional Questionnaire:  Independent - I Dependent - D    Activities of Daily Living (ADLs):    Personal hygiene - I Dressing - I Eating - I Maintaining continence - I Transferring - I   Independent Activities of Daily Living (iADLs): Basic communication skills - I Transportation - I Meal preparation - I Shopping - I Housework - I Managing medications - I  Managing personal finances - I   Confirmed importance and date/time of follow-up visits scheduled YES  Provider Appointment booked with PCP 11/16/16 @ 10 AM.  Confirmed with patient if condition begins to worsen call PCP or go to the ER.  Patient was given the office number and encouraged to call back with question or concerns: YES

## 2016-11-15 ENCOUNTER — Ambulatory Visit (INDEPENDENT_AMBULATORY_CARE_PROVIDER_SITE_OTHER): Payer: 59 | Admitting: Family Medicine

## 2016-11-15 ENCOUNTER — Encounter: Payer: Self-pay | Admitting: Family Medicine

## 2016-11-15 VITALS — BP 132/78 | HR 72 | Temp 98.1°F | Resp 16 | Wt 234.6 lb

## 2016-11-15 DIAGNOSIS — I10 Essential (primary) hypertension: Secondary | ICD-10-CM

## 2016-11-15 DIAGNOSIS — I5042 Chronic combined systolic (congestive) and diastolic (congestive) heart failure: Secondary | ICD-10-CM | POA: Diagnosis not present

## 2016-11-15 DIAGNOSIS — N179 Acute kidney failure, unspecified: Secondary | ICD-10-CM

## 2016-11-15 DIAGNOSIS — E781 Pure hyperglyceridemia: Secondary | ICD-10-CM | POA: Diagnosis not present

## 2016-11-15 NOTE — Progress Notes (Signed)
Patient: Robert Grant Male    DOB: September 22, 1955   61 y.o.   MRN: [id number] Visit Date: 11/15/2016  Today's Provider: Vernie Murders, PA   Chief Complaint  Patient presents with  . Hospitalization Follow-up   Subjective:    HPI  Follow up Hospitalization   Patient was admitted to [facility] on 11/10/2016 and discharged on 11/11/2016. He was treated for AKI with chronic systolic and diastolic CHF (EF 08%) and chronic atrial fib (on Eliquis 5 mg qd). Had some "stomach flu" with nausea, vomiting and diarrhea prior to admission on 11-03-16 to 11-06-16. Had continued antihypertensives as well as his daily Torsemide during this time. Was evaluated in cardiologist's office (Dr. Tammi Klippel) on 11-09-16 and found BUN 119 and creatinine 3.5. In the hospital, Carvedilol, Spironolactone and Lisinopril were stopped at discharge due to orthostatic hypotension. Treatment for this included starting Zofran, change Torsemide 20 mg to 1 tablet if weight is >230lb. Telephone follow up was done on 11/12/2016 He reports excellent compliance with treatment. He reports this condition is Improved, but still some nausea this morning.  ------------------------------------------------------------------------------------  Past Medical History:  Diagnosis Date  . A-fib ([place])   . Hypertension    Patient Active Problem List   Diagnosis Date Noted  . Hematuria, microscopic 06/08/2016  . Obstructive sleep apnea 01/21/2016  . Automatic implantable cardioverter-defibrillator in situ 12/23/2015  . Heart failure ([place]) 04/28/2015  . Chronic combined systolic and diastolic heart failure ([place]) 04/28/2015  . Cardiomyopathy ([place]) 04/22/2015  . Acute CHF ([place]) 04/12/2015  . A-fib ([place]) 04/12/2015  . Beat, premature ventricular 03/31/2015  . Paroxysmal atrial fibrillation ([place]) 03/28/2015  . Compulsive tobacco user syndrome 02/14/2015  . Chest pain 02/14/2015  . Annual physical exam 02/14/2015  . Closed fracture of nasal bones  02/06/2015  . Dizziness 02/06/2015  . Hypertriglyceridemia 02/06/2015  . Basal cell papilloma 02/06/2015  . Feeling stressed out 02/06/2015  . Snores 02/06/2015  . H/O cardiac catheterization 05/16/2014  . Awareness of heartbeats 04/19/2014  . Breath shortness 04/02/2014  . Decreased potassium in the blood 10/16/2007  . Essential (primary) hypertension 11/28/2006  . HLD (hyperlipidemia) 11/28/2006   Past Surgical History:  Procedure Laterality Date  . COLECTOMY  07/2011  . Implant of defibrillator Left 08-22-15  . KNEE SURGERY Right 2006  . TONSILLECTOMY AND ADENOIDECTOMY  1960   Family History  Problem Relation Age of Onset  . Diabetes Mother   . Hypertension Mother   . Kidney disease Mother   . Hyperlipidemia Mother   . Lung cancer Father   . Congestive Heart Failure Maternal Grandmother   . Congestive Heart Failure Paternal Grandmother   ' Allergies  Allergen Reactions  . Niacin Hives     Previous Medications   AMIODARONE (PACERONE) 200 MG TABLET    TAKE 1/2 TABLET (100 MG TOTAL) BY MOUTH ONCE DAILY.   APIXABAN (ELIQUIS) 5 MG TABS TABLET    Take 5 mg by mouth 2 (two) times daily.   ATORVASTATIN (LIPITOR) 40 MG TABLET    TAKE 1 TABLET (40 MG TOTAL) BY MOUTH ONCE DAILY.   COLESEVELAM HCL 3.75 G PACK    Take 1 packet (3.75 g total) by mouth daily. Mix with 4-6 ounces of water.   LISINOPRIL (PRINIVIL,ZESTRIL) 10 MG TABLET    Take 20 mg by mouth 2 (two) times daily.   LISINOPRIL (PRINIVIL,ZESTRIL) 5 MG TABLET    Take 5 mg by mouth 2 (two) times daily.   METOPROLOL SUCCINATE (TOPROL-XL) 25  MG 24 HR TABLET    TAKE 1/2 TABLET BY MOUTH ONCE DAILY.   ONDANSETRON (ZOFRAN) 4 MG TABLET    Take 4 mg by mouth every 8 (eight) hours as needed for nausea or vomiting.   SPIRONOLACTONE (ALDACTONE) 25 MG TABLET    TAKE 2 TABLET (50 MG TOTAL) BY MOUTH ONCE DAILY.   TORSEMIDE (DEMADEX) 20 MG TABLET    TAKE 1 TABLETS BY MOUTH ONCE DAILY PRN weight gain >230    Review of Systems    Constitutional: Negative.   Respiratory: Negative.   Cardiovascular: Negative.   Gastrointestinal: Positive for nausea.    Social History  Substance Use Topics  . Smoking status: Former Smoker    Packs/day: 3.00    Years: 11.00    Types: Cigarettes  . Smokeless tobacco: Never Used  . Alcohol use No   Objective:   BP 132/78 (BP Location: Right Arm, Patient Position: Sitting, Cuff Size: Normal)   Pulse 72   Temp 98.1 F (36.7 C) (Oral)   Resp 16   Wt 234 lb 9.6 oz (106.4 kg)   SpO2 98%   BMI 35.41 kg/m   Physical Exam  Constitutional: He is oriented to person, place, and time. He appears well-developed and well-nourished. No distress.  HENT:  Head: Normocephalic and atraumatic.  Right Ear: Hearing normal.  Left Ear: Hearing normal.  Nose: Nose normal.  Eyes: Conjunctivae and lids are normal. Right eye exhibits no discharge. Left eye exhibits no discharge. No scleral icterus.  Cardiovascular: Normal rate and regular rhythm.   Murmur heard. Mild unchanged murmur heard right sternal border at 2nd ICS. No peripheral edema or rales.  Pulmonary/Chest: Effort normal and breath sounds normal. No respiratory distress. He has no rales.  Abdominal: Soft. Bowel sounds are normal.  Musculoskeletal: Normal range of motion. He exhibits no edema.  Neurological: He is alert and oriented to person, place, and time.  Skin: Skin is intact. No lesion and no rash noted.  Psychiatric: He has a normal mood and affect. His speech is normal and behavior is normal. Thought content normal.      Assessment & Plan:     1. Chronic combined systolic and diastolic heart failure ([place]) Admitted on 11-10-16 to 11-11-16 for fatigue/orthostatic symptoms of dizziness/flushing after a GI illness (vomiting and diarrhea). Final diagnosis of chronic combined systolic and diastolic heart failure with ARF. Cardiologist (Dr Tammi Klippel) stopped the Carvedilol, Spironolactone and Lisinopril. Feeling some better now and  cardiologist advised restarting Spironolactone with Lisinopril on 11-13-16. Recheck CBC and CMP. Continue follow up with cardiologist regularly to assess function of pacemaker and CHF/A.Fib treatment. - CBC with Differential/Platelet - Comprehensive metabolic panel  2. Acute renal failure, unspecified acute renal failure type ([place])  On 11-09-16 creatinine was 3.5 and brought down to 1.8 by the time of his discharge. BUN spiked to 119 and Hgb dropped from 13.5 to 11.7 at admission. Will recheck CBC, CMP and iron with IBC. Recheck pending reports. May be the reason (along with CHF) for some persistence of fatigue. - CBC with Differential/Platelet - Comprehensive metabolic panel - Iron - Iron and TIBC  3. Essential (primary) hypertension After discharge from the hospital, his cardiologist advised he restart Spironolactone and Lisinopril on 11-13-16. BP much better controlled. Recheck CBC and CMP. Increase fluid intake. - CBC with Differential/Platelet - Comprehensive metabolic panel  4. Hypertriglyceridemia Does not tolerate the Cholestyramine. Will check lipids and stop the Welchol. Follow low fat diet and recheck pending reports. -  Comprehensive metabolic panel - Lipid panel

## 2016-11-15 NOTE — Patient Instructions (Signed)
Heart Failure Heart failure is a condition in which the heart has trouble pumping blood because it has become weak or stiff. This means that the heart does not pump blood efficiently for the body to work well. For some people with heart failure, fluid may back up into the lungs and there may be swelling (edema) in the lower legs. Heart failure is usually a long-term (chronic) condition. It is important for you to take good care of yourself and follow the treatment plan from your health care provider. What are the causes? This condition is caused by some health problems, including:  High blood pressure (hypertension). Hypertension causes the heart muscle to work harder than normal. High blood pressure eventually causes the heart to become stiff and weak.  Coronary artery disease (CAD). CAD is the buildup of cholesterol and fat (plaques) in the arteries of the heart.  Heart attack (myocardial infarction). Injured tissue, which is caused by the heart attack, does not contract as well and the heart's ability to pump blood is weakened.  Abnormal heart valves. When the heart valves do not open and close properly, the heart muscle must pump harder to keep the blood flowing.  Heart muscle disease (cardiomyopathy or myocarditis). Heart muscle disease is damage to the heart muscle from a variety of causes, such as drug or alcohol abuse, infections, or unknown causes. These can increase the risk of heart failure.  Lung disease. When the lungs do not work properly, the heart must work harder. What increases the risk? Risk of heart failure increases as a person ages. This condition is also more likely to develop in people who:  Are overweight.  Are male.  Smoke or chew tobacco.  Abuse alcohol or illegal drugs.  Have taken medicines that can damage the heart, such as chemotherapy drugs.  Have diabetes.  High blood sugar (glucose) is associated with high fat (lipid) levels in the blood.  Diabetes  can also damage tiny blood vessels that carry nutrients to the heart muscle.  Have abnormal heart rhythms.  Have thyroid problems.  Have low blood counts (anemia). What are the signs or symptoms? Symptoms of this condition include:  Shortness of breath with activity, such as when climbing stairs.  Persistent cough.  Swelling of the feet, ankles, legs, or abdomen.  Unexplained weight gain.  Difficulty breathing when lying flat (orthopnea).  Waking from sleep because of the need to sit up and get more air.  Rapid heartbeat.  Fatigue and loss of energy.  Feeling light-headed, dizzy, or close to fainting.  Loss of appetite.  Nausea.  Increased urination during the night (nocturia).  Confusion. How is this diagnosed? This condition is diagnosed based on:  Medical history, symptoms, and a physical exam.  Diagnostic tests, which may include:  Echocardiogram.  Electrocardiogram (ECG).  Chest X-ray.  Blood tests.  Exercise stress test.  Radionuclide scans.  Cardiac catheterization and angiogram. How is this treated? Treatment for this condition is aimed at managing the symptoms of heart failure. Medicines, behavioral changes, or other treatments may be necessary to treat heart failure. Medicines  These may include:  Angiotensin-converting enzyme (ACE) inhibitors. This type of medicine blocks the effects of a blood protein called angiotensin-converting enzyme. ACE inhibitors relax (dilate) the blood vessels and help to lower blood pressure.  Angiotensin receptor blockers (ARBs). This type of medicine blocks the actions of a blood protein called angiotensin. ARBs dilate the blood vessels and help to lower blood pressure.  Water pills (diuretics). Diuretics  pills (diuretics). Diuretics cause the kidneys to remove salt and water from the blood. The extra fluid is removed through urination, leaving a lower volume of blood that the heart has to pump. °· Beta blockers. These improve heart  muscle strength and they prevent the heart from beating too quickly. °· Digoxin. This increases the force of the heartbeat. ° °Healthy behavior changes °These may include: °· Reaching and maintaining a healthy weight. °· Stopping smoking or chewing tobacco. °· Eating heart-healthy foods. °· Limiting or avoiding alcohol. °· Stopping use of street drugs (illegal drugs). °· Physical activity. ° °Other treatments °These may include: °· Surgery to open blocked coronary arteries or repair damaged heart valves. °· Placement of a biventricular pacemaker to improve heart muscle function (cardiac resynchronization therapy). This device paces both the right ventricle and left ventricle. °· Placement of a device to treat serious abnormal heart rhythms (implantable cardioverter defibrillator, or ICD). °· Placement of a device to improve the pumping ability of the heart (left ventricular assist device, or LVAD). °· Heart transplant. This can cure heart failure, and it is considered for certain patients who do not improve with other therapies. ° °Follow these instructions at home: °Medicines °· Take over-the-counter and prescription medicines only as told by your health care provider. Medicines are important in reducing the workload of your heart, slowing the progression of heart failure, and improving your symptoms. °? Do not stop taking your medicine unless your health care provider told you to do that. °? Do not skip any dose of medicine. °? Refill your prescriptions before you run out of medicine. You need your medicines every day. °Eating and drinking ° °· Eat heart-healthy foods. Talk with a dietitian to make an eating plan that is right for you. °? Choose foods that contain no trans fat and are low in saturated fat and cholesterol. Healthy choices include fresh or frozen fruits and vegetables, fish, lean meats, legumes, fat-free or low-fat dairy products, and whole-grain or high-fiber foods. °? Limit salt (sodium) if  directed by your health care provider. Sodium restriction may reduce symptoms of heart failure. Ask a dietitian to recommend heart-healthy seasonings. °? Use healthy cooking methods instead of frying. Healthy methods include roasting, grilling, broiling, baking, poaching, steaming, and stir-frying. °· Limit your fluid intake if directed by your health care provider. Fluid restriction may reduce symptoms of heart failure. °Lifestyle °· Stop smoking or using chewing tobacco. Nicotine and tobacco can damage your heart and your blood vessels. Do not use nicotine gum or patches before talking to your health care provider. °· Limit alcohol intake to no more than 1 drink per day for non-pregnant women and 2 drinks per day for men. One drink equals 12 oz of beer, 5 oz of wine, or 1½ oz of hard liquor. °? Drinking more than that is harmful to your heart. Tell your health care provider if you drink alcohol several times a week. °? Talk with your health care provider about whether any level of alcohol use is safe for you. °? If your heart has already been damaged by alcohol or you have severe heart failure, drinking alcohol should be stopped completely. °· Stop use of illegal drugs. °· Lose weight if directed by your health care provider. Weight loss may reduce symptoms of heart failure. °· Do moderate physical activity if directed by your health care provider. People who are elderly and people with severe heart failure should consult with a health care provider for physical activity recommendations. °  important information   Weigh yourself every day. Keeping track of your weight daily helps you to notice excess fluid sooner.  Weigh yourself every morning after you urinate and before you eat breakfast.  Wear the same amount of clothing each time you weigh yourself.  Record your daily weight. Provide your health care provider with your weight record.  Monitor and record your blood pressure as told by your health care  provider.  Check your pulse as told by your health care provider. Dealing with extreme temperatures   If the weather is extremely hot:  Avoid vigorous physical activity.  Use air conditioning or fans or seek a cooler location.  Avoid caffeine and alcohol.  Wear loose-fitting, lightweight, and light-colored clothing.  If the weather is extremely cold:  Avoid vigorous physical activity.  Layer your clothes.  Wear mittens or gloves, a hat, and a scarf when you go outside.  Avoid alcohol. General instructions   Manage other health conditions such as hypertension, diabetes, thyroid disease, or abnormal heart rhythms as told by your health care provider.  Learn to manage stress. If you need help to do this, ask your health care provider.  Plan rest periods when fatigued.  Get ongoing education and support as needed.  Participate in or seek rehabilitation as needed to maintain or improve independence and quality of life.  Stay up to date with immunizations. Keeping current on pneumococcal and influenza immunizations is especially important to prevent respiratory infections.  Keep all follow-up visits as told by your health care provider. This is important. Contact a health care provider if:  You have a rapid weight gain.  You have increasing shortness of breath that is unusual for you.  You are unable to participate in your usual physical activities.  You tire easily.  You cough more than normal, especially with physical activity.  You have any swelling or more swelling in areas such as your hands, feet, ankles, or abdomen.  You are unable to sleep because it is hard to breathe.  You feel like your heart is beating quickly (palpitations).  You become dizzy or light-headed when you stand up. Get help right away if:  You have difficulty breathing.  You notice or your family notices a change in your awareness, such as having trouble staying awake or having  difficulty with concentration.  You have pain or discomfort in your chest.  You have an episode of fainting (syncope). This information is not intended to replace advice given to you by your health care provider. Make sure you discuss any questions you have with your health care provider. Document Released: 08/16/2005 Document Revised: 04/20/2016 Document Reviewed: 03/10/2016 Elsevier Interactive Patient Education  2017 [employer] Acute Kidney Injury, Adult Acute kidney injury is a sudden worsening of kidney function. The kidneys are organs that have several jobs. They filter the blood to remove waste products and extra fluid. They also maintain a healthy balance of minerals and hormones in the body, which helps control blood pressure and keep bones strong. With this condition, your kidneys do not do their jobs as well as they should. This condition ranges from mild to severe. Over time it may develop into long-lasting (chronic) kidney disease. Early detection and treatment may prevent acute kidney injury from developing into a chronic condition. What are the causes? Common causes of this condition include:  A problem with blood flow to the kidneys. This may be caused by:  Low blood pressure (hypotension) or shock.  Blood  loss.  Heart and blood vessel (cardiovascular) disease.  Severe burns.  Liver disease.  Direct damage to the kidneys. This may be caused by:  Certain medicines.  A kidney infection.  Poisoning.  Being around or in contact with toxic substances.  A surgical wound.  A hard, direct hit to the kidney area.  A sudden blockage of urine flow. This may be caused by:  Cancer.  Kidney stones.  An enlarged prostate in males. What are the signs or symptoms? Symptoms of this condition may not be obvious until the condition becomes severe. Symptoms of this condition can include:  Tiredness (lethargy), or difficulty staying awake.  Nausea or  vomiting.  Swelling (edema) of the face, legs, ankles, or feet.  Problems with urination, such as:  Abdominal pain, or pain along the side of your stomach (flank).  Decreased urine production.  Decrease in the force of urine flow.  Muscle twitches and cramps, especially in the legs.  Confusion or trouble concentrating.  Loss of appetite.  Fever. How is this diagnosed? This condition may be diagnosed with tests, including:  Blood tests.  Urine tests.  Imaging tests.  A test in which a sample of tissue is removed from the kidneys to be examined under a microscope (kidney biopsy). How is this treated? Treatment for this condition depends on the cause and how severe the condition is. In mild cases, treatment may not be needed. The kidneys may heal on their own. In more severe cases, treatment will involve:  Treating the cause of the kidney injury. This may involve changing any medicines you are taking or adjusting your dosage.  Fluids. You may need specialized IV fluids to balance your body's needs.  Having a catheter placed to drain urine and prevent blockages.  Preventing problems from occurring. This may mean avoiding certain medicines or procedures that can cause further injury to the kidneys. In some cases treatment may also require:  A procedure to remove toxic wastes from the body (dialysis or continuous renal replacement therapy - CRRT).  Surgery. This may be done to repair a torn kidney, or to remove the blockage from the urinary system. Follow these instructions at home: Medicines   Take over-the-counter and prescription medicines only as told by your health care provider.  Do not take any new medicines without your health care provider's approval. Many medicines can worsen your kidney damage.  Do not take any vitamin and mineral supplements without your health care provider's approval. Many nutritional supplements can worsen your kidney damage. Lifestyle    If your health care provider prescribed changes to your diet, follow them. You may need to decrease the amount of protein you eat.  Achieve and maintain a healthy weight. If you need help with this, ask your health care provider.  Start or continue an exercise plan. Try to exercise at least 30 minutes a day, 5 days a week.  Do not use any tobacco products, such as cigarettes, chewing tobacco, and e-cigarettes. If you need help quitting, ask your health care provider. General instructions   Keep track of your blood pressure. Report changes in your blood pressure as told by your health care provider.  Stay up to date with immunizations. Ask your health care provider which immunizations you need.  Keep all follow-up visits as told by your health care provider. This is important. Where to find more information:  American Association of Kidney Patients: BombTimer.gl  National Kidney Foundation: www.kidney.[place]: https://mathis.com/  [facility]:  www.lifeoptions.org  www.kidneyschool.org Contact a health care provider if:  Your symptoms get worse.  You develop new symptoms. Get help right away if:  You develop symptoms of worsening kidney disease, which include:  Headaches.  Abnormally dark or light skin.  Easy bruising.  Frequent hiccups.  Chest pain.  Shortness of breath.  End of menstruation in women.  Seizures.  Confusion or altered mental status.  Abdominal or back pain.  Itchiness.  You have a fever.  Your body is producing less urine.  You have pain or bleeding when you urinate. Summary  Acute kidney injury is a sudden worsening of kidney function.  Acute kidney injury can be caused by problems with blood flow to the kidneys, direct damage to the kidneys, and sudden blockage of urine flow.  Symptoms of this condition may not be obvious until it becomes severe. Symptoms may include edema, lethargy,  confusion, nausea or vomiting, and problems passing urine.  This condition can usually be diagnosed with blood tests, urine tests, and imaging tests. Sometimes a kidney biopsy is done to diagnose this condition.  Treatment for this condition often involves treating the underlying cause. It is treated with fluids, medicines, dialysis, diet changes, or surgery. This information is not intended to replace advice given to you by your health care provider. Make sure you discuss any questions you have with your health care provider. Document Released: 03/01/2011 Document Revised: 08/06/2016 Document Reviewed: 08/06/2016 Elsevier Interactive Patient Education  2017 [employer].

## 2016-11-16 ENCOUNTER — Inpatient Hospital Stay: Payer: 59 | Admitting: Family Medicine

## 2016-11-16 ENCOUNTER — Telehealth: Payer: Self-pay

## 2016-11-16 LAB — LIPID PANEL
Chol/HDL Ratio: 5.3 ratio units — ABNORMAL HIGH (ref 0.0–5.0)
Cholesterol, Total: 138 mg/dL (ref 100–199)
HDL: 26 mg/dL — ABNORMAL LOW (ref >39)
Triglycerides: 405 mg/dL — ABNORMAL HIGH (ref 0–149)

## 2016-11-16 LAB — COMPREHENSIVE METABOLIC PANEL
ALT: 31 IU/L (ref 0–44)
AST: 19 IU/L (ref 0–40)
Albumin/Globulin Ratio: 2.1 (ref 1.2–2.2)
Albumin: 4.7 g/dL (ref 3.6–4.8)
Alkaline Phosphatase: 66 IU/L (ref 39–117)
BUN/Creatinine Ratio: 17 (ref 10–24)
BUN: 21 mg/dL (ref 8–27)
Bilirubin Total: 0.5 mg/dL (ref 0.0–1.2)
CO2: 22 mmol/L (ref 18–29)
Calcium: 9.3 mg/dL (ref 8.6–10.2)
Chloride: 105 mmol/L (ref 96–106)
Creatinine, Ser: 1.25 mg/dL (ref 0.76–1.27)
GFR calc Af Amer: 71 mL/min/{1.73_m2} (ref >59)
GFR calc non Af Amer: 61 mL/min/{1.73_m2} (ref >59)
Globulin, Total: 2.2 g/dL (ref 1.5–4.5)
Glucose: 107 mg/dL — ABNORMAL HIGH (ref 65–99)
Potassium: 5.2 mmol/L (ref 3.5–5.2)
Sodium: 143 mmol/L (ref 134–144)
Total Protein: 6.9 g/dL (ref 6.0–8.5)

## 2016-11-16 LAB — CBC WITH DIFFERENTIAL/PLATELET
Basophils Absolute: 0 10*3/uL (ref 0.0–0.2)
Basos: 0 %
EOS (ABSOLUTE): 0.4 10*3/uL (ref 0.0–0.4)
Eos: 5 %
Hematocrit: 34.9 % — ABNORMAL LOW (ref 37.5–51.0)
Hemoglobin: 11.4 g/dL — ABNORMAL LOW (ref 13.0–17.7)
Immature Grans (Abs): 0 10*3/uL (ref 0.0–0.1)
Immature Granulocytes: 0 %
Lymphocytes Absolute: 3.1 10*3/uL (ref 0.7–3.1)
Lymphs: 34 %
MCH: 27.9 pg (ref 26.6–33.0)
MCHC: 32.7 g/dL (ref 31.5–35.7)
MCV: 85 fL (ref 79–97)
Monocytes Absolute: 0.5 10*3/uL (ref 0.1–0.9)
Monocytes: 6 %
Neutrophils Absolute: 5 10*3/uL (ref 1.4–7.0)
Neutrophils: 55 %
Platelets: 149 10*3/uL — ABNORMAL LOW (ref 150–379)
RBC: 4.09 x10E6/uL — ABNORMAL LOW (ref 4.14–5.80)
RDW: 14.9 % (ref 12.3–15.4)
WBC: 9.2 10*3/uL (ref 3.4–10.8)

## 2016-11-16 LAB — IRON AND TIBC
Iron Saturation: 18 % (ref 15–55)
Iron: 59 ug/dL (ref 38–169)
Total Iron Binding Capacity: 319 ug/dL (ref 250–450)
UIBC: 260 ug/dL (ref 111–343)

## 2016-11-16 NOTE — Telephone Encounter (Signed)
Patient advised.

## 2016-11-16 NOTE — Telephone Encounter (Signed)
-----   Message from [employer], [place] sent at 11/16/2016  8:20 AM EDT ----- RBC and hemoglobin the same as at discharge from the hospital. Iron levels are normal. Should take a multivitamin with iron daily. Kidney function back to normal (creatinine 1.25). Triglycerides still high but better than 5 months ago. Recommend he continue the present medications and recheck progress in 3 months. Continue follow up with cardiologist as planned.

## 2016-11-17 ENCOUNTER — Encounter: Payer: Self-pay | Admitting: Family Medicine

## 2016-12-15 ENCOUNTER — Encounter: Payer: Self-pay | Admitting: Family Medicine

## 2016-12-17 ENCOUNTER — Encounter: Payer: Self-pay | Admitting: Family Medicine

## 2017-02-23 DIAGNOSIS — Z961 Presence of intraocular lens: Secondary | ICD-10-CM | POA: Insufficient documentation

## 2017-04-01 ENCOUNTER — Encounter: Payer: Self-pay | Admitting: Family Medicine

## 2017-12-06 ENCOUNTER — Ambulatory Visit: Payer: 59 | Admitting: Family Medicine

## 2017-12-06 ENCOUNTER — Encounter: Payer: Self-pay | Admitting: Family Medicine

## 2017-12-06 VITALS — BP 132/80 | HR 74 | Temp 98.3°F | Wt 241.0 lb

## 2017-12-06 DIAGNOSIS — R310 Gross hematuria: Secondary | ICD-10-CM

## 2017-12-06 LAB — POCT URINALYSIS DIPSTICK
Bilirubin, UA: NEGATIVE
Glucose, UA: NEGATIVE
Ketones, UA: NEGATIVE
Nitrite, UA: NEGATIVE
Spec Grav, UA: >=1.03 — AB (ref 1.010–1.025)
Urobilinogen, UA: 0.2 E.U./dL
pH, UA: 5 (ref 5.0–8.0)

## 2017-12-06 NOTE — Progress Notes (Signed)
Patient: Robert Grant Male    DOB: [birth year]   62 y.o.   MRN: [id number] Visit Date: 12/06/2017  Today's Provider: Vernie Murders, PA   Chief Complaint  Patient presents with  . Hematuria   Subjective:    Hematuria  This is a new problem. Episode onset: Thursday. The problem has been waxing and waning since onset. He describes the hematuria as gross hematuria. The hematuria occurs throughout his entire urinary stream. Urine color: dark tea colored. Irritative symptoms do not include frequency or urgency. Associated symptoms include flank pain. Pertinent negatives include no dysuria. Abdominal pain: flank pain  (Back pain ) His past medical history is significant for kidney stones.      Past Medical History:  Diagnosis Date  . A-fib ([place])   . Hypertension    Past Surgical History:  Procedure Laterality Date  . COLECTOMY  07/2011  . Implant of defibrillator Left 08-22-15  . KNEE SURGERY Right 2006  . TONSILLECTOMY AND ADENOIDECTOMY  1960   Family History  Problem Relation Age of Onset  . Diabetes Mother   . Hypertension Mother   . Kidney disease Mother   . Hyperlipidemia Mother   . Lung cancer Father   . Congestive Heart Failure Maternal Grandmother   . Congestive Heart Failure Paternal Grandmother    Patient Active Problem List   Diagnosis Date Noted  . Pseudophakia of right eye 02/23/2017  . Hematuria, microscopic 06/08/2016  . Obstructive sleep apnea 01/21/2016  . Automatic implantable cardioverter-defibrillator in situ 12/23/2015  . Heart failure ([place]) 04/28/2015  . Chronic combined systolic and diastolic heart failure ([place]) 04/28/2015  . Cardiomyopathy ([place]) 04/22/2015  . Acute CHF ([place]) 04/12/2015  . A-fib ([place]) 04/12/2015  . Beat, premature ventricular 03/31/2015  . Paroxysmal atrial fibrillation ([place]) 03/28/2015  . Compulsive tobacco user syndrome 02/14/2015  . Chest pain 02/14/2015  . Annual physical exam 02/14/2015  . Closed fracture of nasal  bones 02/06/2015  . Dizziness 02/06/2015  . Hypertriglyceridemia 02/06/2015  . Basal cell papilloma 02/06/2015  . Feeling stressed out 02/06/2015  . Snores 02/06/2015  . H/O cardiac catheterization 05/16/2014  . Awareness of heartbeats 04/19/2014  . Breath shortness 04/02/2014  . Decreased potassium in the blood 10/16/2007  . Essential (primary) hypertension 11/28/2006  . HLD (hyperlipidemia) 11/28/2006   Allergies  Allergen Reactions  . Niacin Hives    Other reaction(s): Other (See Comments) Hot flashes    Current Outpatient Medications:  .  atorvastatin (LIPITOR) 40 MG tablet, TAKE 1 TABLET (40 MG TOTAL) BY MOUTH ONCE DAILY., Disp: , Rfl: 11 .  carvedilol (COREG) 12.5 MG tablet, Take 12.5 mg by mouth daily., Disp: , Rfl:  .  lisinopril (PRINIVIL,ZESTRIL) 10 MG tablet, Take 20 mg by mouth 2 (two) times daily., Disp: , Rfl:  .  rivaroxaban (XARELTO) 20 MG TABS tablet, Take 20 mg by mouth daily with supper., Disp: , Rfl:  .  spironolactone (ALDACTONE) 25 MG tablet, TAKE 2 TABLET (50 MG TOTAL) BY MOUTH ONCE DAILY., Disp: , Rfl: 11 .  torsemide (DEMADEX) 20 MG tablet, TAKE 1 TABLETS BY MOUTH ONCE DAILY PRN weight gain >230, Disp: , Rfl: 11  Review of Systems  Constitutional: Negative.   Respiratory: Negative.   Cardiovascular: Negative.   Gastrointestinal: Abdominal pain: flank pain   Genitourinary: Positive for flank pain and hematuria. Negative for dysuria, frequency and urgency.  Musculoskeletal: Positive for back pain.   Social History   Tobacco Use  .  Smoking status: Former Smoker    Packs/day: 3.00    Years: 11.00    Pack years: 33.00    Types: Cigarettes  . Smokeless tobacco: Never Used  Substance Use Topics  . Alcohol use: No    Alcohol/week: 0.0 oz   Objective:   BP 132/80 (BP Location: Right Arm, Patient Position: Sitting, Cuff Size: Normal)   Pulse 74   Temp 98.3 F (36.8 C) (Oral)   Wt 241 lb (109.3 kg)   SpO2 98%   BMI 36.38 kg/m   Physical Exam    Constitutional: He is oriented to person, place, and time. He appears well-developed and well-nourished. No distress.  HENT:  Head: Normocephalic and atraumatic.  Right Ear: Hearing normal.  Left Ear: Hearing normal.  Nose: Nose normal.  Eyes: Conjunctivae and lids are normal. Right eye exhibits no discharge. Left eye exhibits no discharge. No scleral icterus.  Cardiovascular: Normal rate.  Pulmonary/Chest: Effort normal. No respiratory distress.  Abdominal: Soft. Bowel sounds are normal.  Musculoskeletal: Normal range of motion.  Neurological: He is alert and oriented to person, place, and time.  Skin: Skin is intact. No lesion and no rash noted.  Psychiatric: He has a normal mood and affect. His speech is normal and behavior is normal. Thought content normal.      Assessment & Plan:     1. Gross hematuria Onset 12-01-17 with some ache in the right flank. Urine clearer today. Denies frequency, urgency, burning or fever. No abdominal pain, nausea, diarrhea, vomiting, constipation or melena. Has a history of kidney stones in the past but discomfort and urine color was different. Will get urine culture, CBC, CMP and encouraged to drink extra fluids. May need renal ultrasound or CT scan if pain returns. - POCT Urinalysis Dipstick - Comprehensive metabolic panel - CBC with Differential/Platelet - Urine Culture       Vernie Murders, PA  [facility]

## 2017-12-07 LAB — CBC WITH DIFFERENTIAL/PLATELET
Basophils Absolute: 0.1 10*3/uL (ref 0.0–0.2)
Basos: 1 %
EOS (ABSOLUTE): 0.3 10*3/uL (ref 0.0–0.4)
Eos: 4 %
Hematocrit: 37.4 % — ABNORMAL LOW (ref 37.5–51.0)
Hemoglobin: 12 g/dL — ABNORMAL LOW (ref 13.0–17.7)
Immature Grans (Abs): 0 10*3/uL (ref 0.0–0.1)
Immature Granulocytes: 0 %
Lymphocytes Absolute: 3.4 10*3/uL — ABNORMAL HIGH (ref 0.7–3.1)
Lymphs: 42 %
MCH: 28.8 pg (ref 26.6–33.0)
MCHC: 32.1 g/dL (ref 31.5–35.7)
MCV: 90 fL (ref 79–97)
Monocytes Absolute: 0.6 10*3/uL (ref 0.1–0.9)
Monocytes: 8 %
Neutrophils Absolute: 3.7 10*3/uL (ref 1.4–7.0)
Neutrophils: 45 %
Platelets: 171 10*3/uL (ref 150–379)
RBC: 4.17 x10E6/uL (ref 4.14–5.80)
RDW: 14.3 % (ref 12.3–15.4)
WBC: 8.1 10*3/uL (ref 3.4–10.8)

## 2017-12-07 LAB — COMPREHENSIVE METABOLIC PANEL
ALT: 26 IU/L (ref 0–44)
AST: 24 IU/L (ref 0–40)
Albumin/Globulin Ratio: 2.3 — ABNORMAL HIGH (ref 1.2–2.2)
Albumin: 4.6 g/dL (ref 3.6–4.8)
Alkaline Phosphatase: 65 IU/L (ref 39–117)
BUN/Creatinine Ratio: 15 (ref 10–24)
BUN: 15 mg/dL (ref 8–27)
Bilirubin Total: 0.5 mg/dL (ref 0.0–1.2)
CO2: 25 mmol/L (ref 20–29)
Calcium: 9.2 mg/dL (ref 8.6–10.2)
Chloride: 101 mmol/L (ref 96–106)
Creatinine, Ser: 1.03 mg/dL (ref 0.76–1.27)
GFR calc Af Amer: 89 mL/min/{1.73_m2} (ref >59)
GFR calc non Af Amer: 77 mL/min/{1.73_m2} (ref >59)
Globulin, Total: 2 g/dL (ref 1.5–4.5)
Glucose: 119 mg/dL — ABNORMAL HIGH (ref 65–99)
Potassium: 4.2 mmol/L (ref 3.5–5.2)
Sodium: 140 mmol/L (ref 134–144)
Total Protein: 6.6 g/dL (ref 6.0–8.5)

## 2017-12-08 ENCOUNTER — Telehealth: Payer: Self-pay

## 2017-12-08 DIAGNOSIS — R319 Hematuria, unspecified: Secondary | ICD-10-CM

## 2017-12-08 LAB — URINE CULTURE: Organism ID, Bacteria: NO GROWTH

## 2017-12-08 NOTE — Telephone Encounter (Signed)
-----   Message from [employer], [place] sent at 12/08/2017  7:54 AM EDT ----- No sign of infection on urine culture or blood tests. No sign of liver disease. Recommend referral to urologist to evaluate blood in urine.

## 2017-12-08 NOTE — Telephone Encounter (Signed)
LMTCB

## 2017-12-08 NOTE — Telephone Encounter (Signed)
Order for Urology referral placed. Please schedule.

## 2017-12-08 NOTE — Telephone Encounter (Signed)
Patient advised and would like referral. KW

## 2017-12-08 NOTE — Telephone Encounter (Signed)
Thanks

## 2017-12-23 ENCOUNTER — Ambulatory Visit: Payer: 59 | Admitting: Urology

## 2017-12-23 ENCOUNTER — Encounter: Payer: Self-pay | Admitting: Urology

## 2017-12-23 VITALS — BP 178/93 | HR 88 | Ht 69.0 in | Wt 230.0 lb

## 2017-12-23 DIAGNOSIS — R109 Unspecified abdominal pain: Secondary | ICD-10-CM

## 2017-12-23 DIAGNOSIS — Z87442 Personal history of urinary calculi: Secondary | ICD-10-CM

## 2017-12-23 DIAGNOSIS — R310 Gross hematuria: Secondary | ICD-10-CM

## 2017-12-23 LAB — URINALYSIS, COMPLETE
Bilirubin, UA: NEGATIVE
Glucose, UA: NEGATIVE
Ketones, UA: NEGATIVE
Nitrite, UA: NEGATIVE
Specific Gravity, UA: 1.025 (ref 1.005–1.030)
Urobilinogen, Ur: 0.2 mg/dL (ref 0.2–1.0)
pH, UA: 5.5 (ref 5.0–7.5)

## 2017-12-23 LAB — MICROSCOPIC EXAMINATION

## 2017-12-23 NOTE — Progress Notes (Signed)
12/23/2017 12:34 PM   Robert Grant 1956/01/14 [id number]  Referring provider: Margo Common, [place],  [address]  Chief Complaint  Patient presents with  . Hematuria    New Patient    HPI: 62 year old male who presents today for further evaluation of hematuria.  He was seen and evaluated by his primary care physician for an episode of painless gross hematuria three weeks ago.  UA at that time showed blood without infection.  Urine culture was negative.   He reports that his bleeding lasted for several days and then stopped.  He has had some discomfort in his right side which started in and around the time of his gross hematuria.  The pain comes and goes with variable intensity.  It does not exacerbated by anything, nothing makes it better or worse.    He does have a personal kidney stones in the 102s.  He reports the aforementioned pain is not similar to his previous episode of kidney stones  UA today does have 11-30 red blood cells per high-powered field otherwise, fairly unremarkable without evidence of infection.  He does have a history of a paroxysmal A. fib on Xarelto.   Remote heavy smoker, 2pp x 10 days but quit 35 years ago.     PMH: Past Medical History:  Diagnosis Date  . A-fib ([place])   . Heart failure ([place])   . Hyperlipemia   . Hypertension   . Sleep apnea     Surgical History: Past Surgical History:  Procedure Laterality Date  . COLECTOMY  07/2011  . Implant of defibrillator Left 08-22-15  . KNEE SURGERY Right 2006  . TONSILLECTOMY AND ADENOIDECTOMY  1960    Home Medications:  Allergies as of 12/23/2017      Reactions   Niacin Hives   Other reaction(s): Other (See Comments) Hot flashes      Medication List        Accurate as of 12/23/17 12:34 PM. Always use your most recent med list.          atorvastatin 40 MG tablet Commonly known as:  LIPITOR TAKE 1 TABLET (40 MG TOTAL) BY MOUTH ONCE DAILY.     carvedilol 12.5 MG tablet Commonly known as:  COREG Take 12.5 mg by mouth daily.   lisinopril 10 MG tablet Commonly known as:  PRINIVIL,ZESTRIL Take 20 mg by mouth 2 (two) times daily.   rivaroxaban 20 MG Tabs tablet Commonly known as:  XARELTO Take 20 mg by mouth daily with supper.   spironolactone 25 MG tablet Commonly known as:  ALDACTONE TAKE 2 TABLET (50 MG TOTAL) BY MOUTH ONCE DAILY.   torsemide 20 MG tablet Commonly known as:  DEMADEX TAKE 1 TABLETS BY MOUTH ONCE DAILY PRN weight gain >230       Allergies:  Allergies  Allergen Reactions  . Niacin Hives    Other reaction(s): Other (See Comments) Hot flashes    Family History: Family History  Problem Relation Age of Onset  . Diabetes Mother   . Hypertension Mother   . Kidney disease Mother   . Hyperlipidemia Mother   . Lung cancer Father   . Prostate cancer Father   . Congestive Heart Failure Maternal Grandmother   . Congestive Heart Failure Paternal Grandmother     Social History:  reports that he has quit smoking. His smoking use included cigarettes. He has a 33.00 pack-year smoking history. He has never used smokeless tobacco. He reports that he  does not drink alcohol or use drugs.  ROS: UROLOGY Frequent Urination?: No Hard to postpone urination?: No Burning/pain with urination?: No Get up at night to urinate?: Yes Leakage of urine?: No Urine stream starts and stops?: No Trouble starting stream?: Yes Do you have to strain to urinate?: No Blood in urine?: Yes Urinary tract infection?: No Sexually transmitted disease?: No Injury to kidneys or bladder?: No Painful intercourse?: No Weak stream?: No Erection problems?: Yes Penile pain?: No  Gastrointestinal Nausea?: No Vomiting?: No Indigestion/heartburn?: No Diarrhea?: No Constipation?: No  Constitutional Fever: No Night sweats?: No Weight loss?: No Fatigue?: Yes  Skin Skin rash/lesions?: No Itching?: No  Eyes Blurred vision?:  No Double vision?: No  Ears/Nose/Throat Sore throat?: No Sinus problems?: Yes  Hematologic/Lymphatic Swollen glands?: No Easy bruising?: Yes  Cardiovascular Leg swelling?: No Chest pain?: No  Respiratory Cough?: No Shortness of breath?: No  Endocrine Excessive thirst?: No  Musculoskeletal Back pain?: No Joint pain?: No  Neurological Headaches?: Yes Dizziness?: Yes  Psychologic Depression?: No Anxiety?: No  Physical Exam: BP (!) 178/93   Pulse 88   Ht 5\' 9"  (1.753 m)   Wt 230 lb (104.3 kg)   BMI 33.97 kg/m   Constitutional:  Alert and oriented, No acute distress. HEENT: [place] AT, moist mucus membranes.  Trachea midline, no masses. Cardiovascular: No clubbing, cyanosis, or edema. Respiratory: Normal respiratory effort, no increased work of breathing. GI: Abdomen is soft, nontender, nondistended, no abdominal masses.   GU: No CVA tenderness Skin: No rashes, bruises or suspicious lesions. Neurologic: Grossly intact, no focal deficits, moving all 4 extremities. Psychiatric: Normal mood and affect.  Laboratory Data: Lab Results  Component Value Date   WBC 8.1 12/06/2017   HGB 12.0 (L) 12/06/2017   HCT 37.4 (L) 12/06/2017   MCV 90 12/06/2017   PLT 171 12/06/2017    Lab Results  Component Value Date   CREATININE 1.03 12/06/2017    Lab Results  Component Value Date   HGBA1C 6.9 (H) 06/09/2016    Urinalysis UA today reviewed, microscopic hematuria but otherwise negative.  Pertinent Imaging: N/A  Assessment & Plan:    1. Gross hematuria We discussed the differential diagnosis for microscopic hematuria including nephrolithiasis, renal or upper tract tumors, bladder stones, UTIs, or bladder tumors as well as undetermined etiologies. Per AUA guidelines, I did recommend complete microscopic hematuria evaluation including CTU, possible urine cytology, and office cystoscopy. - Urinalysis, Complete - CT HEMATURIA WORKUP; Future  2. History of kidney  stones As above - CT HEMATURIA WORKUP; Future  3. Right flank pain Right flank pain of unclear etiology, may be related to a stone We reviewed warning symptoms in detail including seeking urgent medical attention in the setting of fevers, chills, or severe poorly controlled pain   Return in about 1 month (around 01/20/2018) for f/u CT urogram/ cysto.  Hollice Espy, MD  [facility] [address], [place], [place] [address] [phone]

## 2018-01-12 ENCOUNTER — Ambulatory Visit: Payer: 59

## 2018-01-31 ENCOUNTER — Ambulatory Visit: Payer: 59 | Admitting: Urology

## 2018-05-26 ENCOUNTER — Encounter: Payer: Self-pay | Admitting: Family Medicine

## 2018-05-26 ENCOUNTER — Ambulatory Visit (INDEPENDENT_AMBULATORY_CARE_PROVIDER_SITE_OTHER): Payer: 59 | Admitting: Family Medicine

## 2018-05-26 VITALS — BP 142/76 | HR 65 | Temp 98.2°F | Ht 69.0 in | Wt 228.0 lb

## 2018-05-26 DIAGNOSIS — Z0000 Encounter for general adult medical examination without abnormal findings: Secondary | ICD-10-CM | POA: Diagnosis not present

## 2018-05-26 DIAGNOSIS — G4733 Obstructive sleep apnea (adult) (pediatric): Secondary | ICD-10-CM | POA: Diagnosis not present

## 2018-05-26 DIAGNOSIS — I5042 Chronic combined systolic (congestive) and diastolic (congestive) heart failure: Secondary | ICD-10-CM | POA: Diagnosis not present

## 2018-05-26 DIAGNOSIS — I480 Paroxysmal atrial fibrillation: Secondary | ICD-10-CM

## 2018-05-26 DIAGNOSIS — Z95810 Presence of automatic (implantable) cardiac defibrillator: Secondary | ICD-10-CM

## 2018-05-26 NOTE — Progress Notes (Signed)
Patient: Robert Grant, Male    DOB: [birth year], 62 y.o.   MRN: [id number] Visit Date: 05/26/2018  Today's Provider: Vernie Murders, PA   Chief Complaint  Patient presents with  . Annual Exam   Subjective:    Annual physical exam Robert Grant is a 62 y.o. male who presents today for health maintenance and complete physical. He feels well. He reports exercising some. He reports he is sleeping fairly well.  -----------------------------------------------------------------   Review of Systems  Constitutional: Positive for fatigue. Negative for activity change, appetite change, chills, diaphoresis, fever and unexpected weight change.  HENT: Positive for tinnitus. Negative for congestion, dental problem, drooling, ear discharge, ear pain, facial swelling, hearing loss, mouth sores, nosebleeds, postnasal drip, rhinorrhea, sinus pressure, sinus pain, sneezing, sore throat, trouble swallowing and voice change.   Eyes: Negative.   Respiratory: Negative.   Cardiovascular: Negative.   Gastrointestinal: Negative.   Endocrine: Negative.   Genitourinary: Negative.   Musculoskeletal: Negative.   Skin: Negative.   Allergic/Immunologic: Negative.   Neurological: Positive for dizziness. Negative for tremors, seizures, syncope, facial asymmetry, speech difficulty, weakness, light-headedness, numbness and headaches.  Hematological: Negative for adenopathy. Bruises/bleeds easily.  Psychiatric/Behavioral: Negative.    Social History      He  reports that he has quit smoking. His smoking use included cigarettes. He has a 33.00 pack-year smoking history. He has never used smokeless tobacco. He reports that he does not drink alcohol or use drugs.       Social History   Socioeconomic History  . Marital status: Married    Spouse name: Not on file  . Number of children: Not on file  . Years of education: Not on file  . Highest education level: Not on file  Occupational History  . Not  on file  Social Needs  . Financial resource strain: Not on file  . Food insecurity:    Worry: Not on file    Inability: Not on file  . Transportation needs:    Medical: Not on file    Non-medical: Not on file  Tobacco Use  . Smoking status: Former Smoker    Packs/day: 3.00    Years: 11.00    Pack years: 33.00    Types: Cigarettes  . Smokeless tobacco: Never Used  Substance and Sexual Activity  . Alcohol use: No    Alcohol/week: 0.0 standard drinks  . Drug use: No  . Sexual activity: Yes    Birth control/protection: None  Lifestyle  . Physical activity:    Days per week: Not on file    Minutes per session: Not on file  . Stress: Not on file  Relationships  . Social connections:    Talks on phone: Not on file    Gets together: Not on file    Attends religious service: Not on file    Active member of club or organization: Not on file    Attends meetings of clubs or organizations: Not on file    Relationship status: Not on file  Other Topics Concern  . Not on file  Social History Narrative  . Not on file   Past Medical History:  Diagnosis Date  . A-fib ([place])   . Heart failure ([place])   . Hyperlipemia   . Hypertension   . Sleep apnea    Patient Active Problem List   Diagnosis Date Noted  . Pseudophakia of right eye 02/23/2017  . Hematuria, microscopic 06/08/2016  .  Obstructive sleep apnea 01/21/2016  . Automatic implantable cardioverter-defibrillator in situ 12/23/2015  . Heart failure ([place]) 04/28/2015  . Chronic combined systolic and diastolic heart failure ([place]) 04/28/2015  . Cardiomyopathy ([place]) 04/22/2015  . Acute CHF ([place]) 04/12/2015  . A-fib ([place]) 04/12/2015  . Beat, premature ventricular 03/31/2015  . Paroxysmal atrial fibrillation ([place]) 03/28/2015  . Compulsive tobacco user syndrome 02/14/2015  . Chest pain 02/14/2015  . Annual physical exam 02/14/2015  . Closed fracture of nasal bones 02/06/2015  . Dizziness 02/06/2015  . Hypertriglyceridemia  02/06/2015  . Basal cell papilloma 02/06/2015  . Feeling stressed out 02/06/2015  . Snores 02/06/2015  . H/O cardiac catheterization 05/16/2014  . Awareness of heartbeats 04/19/2014  . Breath shortness 04/02/2014  . Decreased potassium in the blood 10/16/2007  . Essential (primary) hypertension 11/28/2006  . HLD (hyperlipidemia) 11/28/2006   Past Surgical History:  Procedure Laterality Date  . COLECTOMY  07/2011  . Implant of defibrillator Left 08-22-15  . KNEE SURGERY Right 2006  . TONSILLECTOMY AND ADENOIDECTOMY  1960   Family History        Family Status  Relation Name Status  . Mother  Deceased  . Father  Deceased  . MGM  Deceased  . MGF  Deceased  . PGM  Deceased  . PGF  Deceased        His family history includes Congestive Heart Failure in his maternal grandmother and paternal grandmother; Diabetes in his mother; Hyperlipidemia in his mother; Hypertension in his mother; Kidney disease in his mother; Lung cancer in his father; Prostate cancer in his father.     Allergies  Allergen Reactions  . Niacin Hives    Other reaction(s): Other (See Comments) Hot flashes    Current Outpatient Medications:  .  atorvastatin (LIPITOR) 40 MG tablet, TAKE 1 TABLET (40 MG TOTAL) BY MOUTH ONCE DAILY., Disp: , Rfl: 11 .  carvedilol (COREG) 25 MG tablet, Take 25 mg by mouth 2 (two) times daily., Disp: , Rfl: 3 .  lisinopril (PRINIVIL,ZESTRIL) 10 MG tablet, Take 20 mg by mouth 2 (two) times daily., Disp: , Rfl:  .  rivaroxaban (XARELTO) 20 MG TABS tablet, Take 20 mg by mouth daily with supper., Disp: , Rfl:  .  spironolactone (ALDACTONE) 25 MG tablet, TAKE 2 TABLET (50 MG TOTAL) BY MOUTH ONCE DAILY., Disp: , Rfl: 11 .  torsemide (DEMADEX) 20 MG tablet, TAKE 1 TABLETS BY MOUTH ONCE DAILY PRN weight gain >230, Disp: , Rfl: 11 .  carvedilol (COREG) 12.5 MG tablet, Take 12.5 mg by mouth daily., Disp: , Rfl:    Patient Care Team: Ashritha Desrosiers, Vickki Muff, PA as PCP - General (Physician  Assistant)      Objective:   Vitals: BP (!) 142/76 (BP Location: Right Arm, Patient Position: Sitting, Cuff Size: Large)   Pulse 65   Temp 98.2 F (36.8 C) (Oral)   Ht 5\' 9"  (1.753 m)   Wt 228 lb (103.4 kg)   SpO2 97%   BMI 33.67 kg/m   Wt Readings from Last 3 Encounters:  05/26/18 228 lb (103.4 kg)  12/23/17 230 lb (104.3 kg)  12/06/17 241 lb (109.3 kg)    Vitals:   05/26/18 1320  BP: (!) 142/76  Pulse: 65  Temp: 98.2 F (36.8 C)  TempSrc: Oral  SpO2: 97%  Weight: 228 lb (103.4 kg)  Height: 5\' 9"  (1.753 m)    Physical Exam  Constitutional: He is oriented to person, place, and time. He appears well-developed  and well-nourished.  HENT:  Head: Normocephalic and atraumatic.  Right Ear: External ear normal.  Left Ear: External ear normal.  Nose: Nose normal.  Mouth/Throat: Oropharynx is clear and moist.  Eyes: Pupils are equal, round, and reactive to light. Conjunctivae and EOM are normal. Right eye exhibits no discharge.  Neck: Normal range of motion. Neck supple. No tracheal deviation present. No thyromegaly present.  Cardiovascular: Normal rate, regular rhythm and intact distal pulses.  Murmur heard. Unchanged murmur. Automatic cardioverter-defibrillator subcutaneously in the left upper chest.  Pulmonary/Chest: Effort normal and breath sounds normal. No respiratory distress. He has no wheezes. He has no rales. He exhibits no tenderness.  Abdominal: Soft. He exhibits no distension and no mass. There is no tenderness. There is no rebound and no guarding.  Musculoskeletal: Normal range of motion. He exhibits no edema or tenderness.  Lymphadenopathy:    He has no cervical adenopathy.  Neurological: He is alert and oriented to person, place, and time. He has normal reflexes. He displays normal reflexes. No cranial nerve deficit. He exhibits normal muscle tone. Coordination normal.  Skin: Skin is warm and dry. No rash noted. No erythema.  Psychiatric: He has a normal mood  and affect. His behavior is normal. Judgment and thought content normal.    Depression Screen PHQ 2/9 Scores 05/26/2018 12/06/2017 02/14/2015  PHQ - 2 Score 1 0 1  PHQ- 9 Score 6 - -    Assessment & Plan:     Routine Health Maintenance and Physical Exam  Exercise Activities and Dietary recommendations Goals   Continue low fat diet and work on weight reduction with regular exercise as cardiologist allows.     Immunization History  Administered Date(s) Administered  . Influenza,inj,Quad PF,6+ Mos 06/26/2014, 05/27/2015, 06/08/2016, 06/10/2017, 05/23/2018  . Influenza-Unspecified 05/30/2014  . Pneumococcal Polysaccharide-23 05/27/2015  . Tdap 02/13/2008  . Zoster 06/26/2014    Health Maintenance  Topic Date Due  . Samul Dada  02/12/2018  . COLONOSCOPY  01/06/2021  . INFLUENZA VACCINE  Completed  . Hepatitis C Screening  Completed  . HIV Screening  Completed     Discussed health benefits of physical activity, and encouraged him to engage in regular exercise appropriate for his age and condition.    -------------------------------------------------------------------- 1. Annual physical exam General health stable with cardiovascular disease as listed below. Lab reports pending with tests started 05-23-18 at his cardiologist's office. Had flu shot on 05-23-18. Will plan to up date tetanus in 1-2 months.  2. Paroxysmal atrial fibrillation (HCC) Stable and no chest pains. Continues Xarelto 20 mg qd (some occasional bruising) and Carvedilol 25 mg BID. Pacemaker/defibrillator in the left upper chest is still in place.  3. Chronic combined systolic and diastolic heart failure (HCC) *NYHA Class III with exercise tolerance decline but some improvement in LV function ( LVEF 30% on 04-13-15 and 45% on 10-11-17) per Dr. Tammi Klippel (cardiologist at [facility]) at appointment on 05-23-18. Continues Carvedilol, Lisinopril and Spironolactone with good tolerance. Lab reports pending from that day.  4.  Obstructive sleep apnea Followed by Dr. Tammi Klippel with moderate OSA, AHI 25/hr during supine sleep. CPAP titration recommended by repeat PSG.  5. Automatic implantable cardioverter-defibrillator in situ Left upper chest since 08-22-15 and followed by Dr. Tammi Klippel (cardiologist) at [facility] with history of non-ischemic cardiomyopathy and CHF with paroxysmal atrial fibrillation.    Vernie Murders, PA  [facility]

## 2018-09-25 ENCOUNTER — Encounter: Payer: Self-pay | Admitting: Family Medicine

## 2018-09-25 ENCOUNTER — Ambulatory Visit: Payer: 59 | Admitting: Family Medicine

## 2018-09-25 VITALS — BP 130/80 | HR 74 | Temp 97.8°F | Resp 16 | Wt 242.8 lb

## 2018-09-25 DIAGNOSIS — J0100 Acute maxillary sinusitis, unspecified: Secondary | ICD-10-CM

## 2018-09-25 MED ORDER — AMOXICILLIN 875 MG PO TABS: 875.0000 mg | ORAL_TABLET | Freq: Two times a day (BID) | ORAL | 0 refills | Status: DC

## 2018-09-25 NOTE — Progress Notes (Signed)
Patient: Robert Grant Male    DOB: 02/22/1956   63 y.o.   MRN: [id number] Visit Date: 09/25/2018  Today's Provider: Vernie Murders, PA   Chief Complaint  Patient presents with  . URI   Subjective:     URI   This is a new problem. The current episode started 1 to 4 weeks ago. The problem has been unchanged. There has been no fever. Associated symptoms include headaches, a plugged ear sensation and sinus pain. Pertinent negatives include no chest pain, congestion, coughing, ear pain, rhinorrhea, sore throat, vomiting or wheezing. He has tried nothing for the symptoms.   Patient also c/o elevated blood pressure. Patient reports that his blood pressure has been running high 170/90. Reports that he has been having headache, blurry vision off and on for the past week. Denies Chest Pain, SOB or swelling. Patient also reports that he is scheduled for surgery Feb 20,2020. 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   Past Medical History:  Diagnosis Date  . A-fib ([place])   . Heart failure ([place])   . Hyperlipemia   . Hypertension   . Sleep apnea    Past Surgical History:  Procedure Laterality Date  . COLECTOMY  07/2011  . Implant of defibrillator Left 08-22-15  . KNEE SURGERY Right 2006  . TONSILLECTOMY AND ADENOIDECTOMY  1960   Family History  Problem Relation Age of Onset  . Diabetes Mother   . Hypertension Mother   . Kidney disease Mother   . Hyperlipidemia Mother   . Lung cancer Father   . Prostate cancer Father   . Congestive Heart Failure Maternal Grandmother   . Congestive Heart Failure Paternal Grandmother    Allergies  Allergen Reactions  . Niacin Hives    Other reaction(s): Other (See Comments) Hot flashes    Current Outpatient Medications:  .  atorvastatin (LIPITOR) 40 MG tablet, TAKE 1 TABLET (40 MG TOTAL) BY MOUTH ONCE DAILY., Disp: , Rfl: 11 .  carvedilol (COREG) 25 MG tablet, Take 25 mg by mouth 2 (two) times daily., Disp: , Rfl:  3 .  lisinopril (PRINIVIL,ZESTRIL) 10 MG tablet, Take 20 mg by mouth 2 (two) times daily., Disp: , Rfl:  .  rivaroxaban (XARELTO) 20 MG TABS tablet, Take 20 mg by mouth daily with supper., Disp: , Rfl:  .  spironolactone (ALDACTONE) 25 MG tablet, TAKE 2 TABLET (50 MG TOTAL) BY MOUTH ONCE DAILY., Disp: , Rfl: 11 .  torsemide (DEMADEX) 20 MG tablet, TAKE 1 TABLETS BY MOUTH ONCE DAILY PRN weight gain >230, Disp: , Rfl: 11  Review of Systems  Constitutional: Negative for chills and fever.  HENT: Positive for sinus pressure and sinus pain. Negative for congestion, ear pain, postnasal drip, rhinorrhea and sore throat.   Eyes: Positive for visual disturbance (off and on for the past week).  Respiratory: Negative for cough, chest tightness, shortness of breath and wheezing.   Cardiovascular: Negative for chest pain.  Gastrointestinal: Negative for vomiting.  Neurological: Positive for headaches.   Social History   Tobacco Use  . Smoking status: Former Smoker    Packs/day: 3.00    Years: 11.00    Pack years: 33.00    Types: Cigarettes  . Smokeless tobacco: Never Used  Substance Use Topics  . Alcohol use: No    Alcohol/week: 0.0 standard drinks     Objective:   BP 130/80 (BP Location: Right Arm, Patient Position: Sitting, Cuff  Size: Large)   Pulse 74   Temp 97.8 F (36.6 C) (Oral)   Resp 16   Wt 242 lb 12.8 oz (110.1 kg)   SpO2 97%   BMI 35.86 kg/m  Vitals:   09/25/18 1423  BP: 130/80  Pulse: 74  Resp: 16  Temp: 97.8 F (36.6 C)  TempSrc: Oral  SpO2: 97%  Weight: 242 lb 12.8 oz (110.1 kg)   Physical Exam Constitutional:      General: He is not in acute distress.    Appearance: He is well-developed.  HENT:     Head: Normocephalic and atraumatic.     Right Ear: Hearing, tympanic membrane and ear canal normal.     Left Ear: Hearing, tympanic membrane and ear canal normal.     Nose: Nose normal.     Comments: No transillumination of maxillary sinuses and pressure  discomfort on the left.    Mouth/Throat:     Pharynx: Oropharynx is clear.  Eyes:     General: Lids are normal. No scleral icterus.       Right eye: No discharge.        Left eye: No discharge.     Conjunctiva/sclera: Conjunctivae normal.  Neck:     Musculoskeletal: Neck supple.  Cardiovascular:     Rate and Rhythm: Normal rate and regular rhythm.     Heart sounds: Normal heart sounds.  Pulmonary:     Effort: Pulmonary effort is normal. No respiratory distress.  Abdominal:     General: Bowel sounds are normal.  Musculoskeletal: Normal range of motion.  Lymphadenopathy:     Cervical: No cervical adenopathy.  Skin:    Findings: No lesion or rash.  Neurological:     Mental Status: He is alert and oriented to person, place, and time.  Psychiatric:        Speech: Speech normal.        Behavior: Behavior normal.        Thought Content: Thought content normal.       Assessment & Plan    1. Acute maxillary sinusitis, recurrence not specified Onset of congestion and sinus pressure the past 2 weeks. No fever but left cheek and teeth aching. May use Mucinex and Coricidin-HBP with Flonase Nasal Spray. Add antibiotic and recheck if no better in 7-10 days. - amoxicillin (AMOXIL) 875 MG tablet; Take 1 tablet (875 mg total) by mouth 2 (two) times daily.  Dispense: 20 tablet; Refill: [place], PA  [facility]

## 2018-10-29 HISTORY — PX: KNEE ARTHROSCOPY: SUR90

## 2018-12-29 ENCOUNTER — Telehealth: Payer: Self-pay

## 2018-12-29 ENCOUNTER — Ambulatory Visit: Payer: 59 | Admitting: Family Medicine

## 2018-12-29 ENCOUNTER — Encounter: Payer: Self-pay | Admitting: Family Medicine

## 2018-12-29 ENCOUNTER — Other Ambulatory Visit: Payer: Self-pay

## 2018-12-29 VITALS — BP 134/78 | HR 60 | Temp 98.7°F | Resp 16 | Ht 69.0 in | Wt 235.0 lb

## 2018-12-29 DIAGNOSIS — J0100 Acute maxillary sinusitis, unspecified: Secondary | ICD-10-CM | POA: Diagnosis not present

## 2018-12-29 DIAGNOSIS — I5042 Chronic combined systolic (congestive) and diastolic (congestive) heart failure: Secondary | ICD-10-CM | POA: Diagnosis not present

## 2018-12-29 MED ORDER — AMOXICILLIN 875 MG PO TABS: 875.0000 mg | ORAL_TABLET | Freq: Two times a day (BID) | ORAL | 0 refills | Status: DC

## 2018-12-29 NOTE — Telephone Encounter (Signed)
Patient states that has sinus pressure, sinus pain, nasal drainage, no fever, that started over a week ago. He would some medication sent to his pharmacy.

## 2018-12-29 NOTE — Telephone Encounter (Signed)
Schedule appointment or virtual visit with temperature and BP taken at home.

## 2018-12-29 NOTE — Progress Notes (Signed)
Patient: Robert Grant Male    DOB: 1956/01/04   63 y.o.   MRN: [id number] Visit Date: 12/29/2018  Today's Provider: Vernie Murders, PA   Chief Complaint  Patient presents with  . URI   Subjective:     URI   This is a new problem. The current episode started in the past 7 days (about 6 days). The problem has been gradually worsening. There has been no fever. Associated symptoms include congestion, coughing, ear pain, headaches, nausea, a plugged ear sensation, rhinorrhea, sinus pain and sneezing. He has tried antihistamine and decongestant for the symptoms. The treatment provided mild relief.   Past Surgical History:  Procedure Laterality Date  . COLECTOMY  07/2011  . Implant of defibrillator Left 08-22-15  . KNEE SURGERY Right 2006  . TONSILLECTOMY AND ADENOIDECTOMY  1960   Family History  Problem Relation Age of Onset  . Diabetes Mother   . Hypertension Mother   . Kidney disease Mother   . Hyperlipidemia Mother   . Lung cancer Father   . Prostate cancer Father   . Congestive Heart Failure Maternal Grandmother   . Congestive Heart Failure Paternal Grandmother    Past Medical History:  Diagnosis Date  . A-fib ([facility])   . Heart failure ([place])   . Hyperlipemia   . Hypertension   . Sleep apnea    Allergies  Allergen Reactions  . Niacin Hives    Other reaction(s): Other (See Comments) Hot flashes    Current Outpatient Medications:  .  atorvastatin (LIPITOR) 40 MG tablet, TAKE 1 TABLET (40 MG TOTAL) BY MOUTH ONCE DAILY., Disp: , Rfl: 11 .  carvedilol (COREG) 25 MG tablet, Take 25 mg by mouth 2 (two) times daily., Disp: , Rfl: 3 .  lisinopril (PRINIVIL,ZESTRIL) 10 MG tablet, Take 20 mg by mouth 2 (two) times daily., Disp: , Rfl:  .  rivaroxaban (XARELTO) 20 MG TABS tablet, Take 20 mg by mouth daily with supper., Disp: , Rfl:  .  spironolactone (ALDACTONE) 25 MG tablet, TAKE 2 TABLET (50 MG TOTAL) BY MOUTH ONCE DAILY., Disp: , Rfl: 11 .  torsemide (DEMADEX) 20 MG  tablet, TAKE 1 TABLETS BY MOUTH ONCE DAILY PRN weight gain >230, Disp: , Rfl: 11 .  amoxicillin (AMOXIL) 875 MG tablet, Take 1 tablet (875 mg total) by mouth 2 (two) times daily. (Patient not taking: Reported on 12/29/2018), Disp: 20 tablet, Rfl: 0  Review of Systems  HENT: Positive for congestion, ear pain, rhinorrhea, sinus pain and sneezing.   Respiratory: Positive for cough.   Gastrointestinal: Positive for nausea.  Neurological: Positive for headaches.   Social History   Tobacco Use  . Smoking status: Former Smoker    Packs/day: 3.00    Years: 11.00    Pack years: 33.00    Types: Cigarettes  . Smokeless tobacco: Never Used  Substance Use Topics  . Alcohol use: No    Alcohol/week: 0.0 standard drinks     Objective:   BP 134/78 (BP Location: Right Arm, Patient Position: Sitting, Cuff Size: Large)   Pulse 60   Temp 98.7 F (37.1 C)   Resp 16   Ht 5\' 9"  (1.753 m)   Wt 235 lb (106.6 kg)   SpO2 98%   BMI 34.70 kg/m  Vitals:   12/29/18 0928  BP: 134/78  Pulse: 60  Resp: 16  Temp: 98.7 F (37.1 C)  SpO2: 98%  Weight: 235 lb (106.6 kg)  Height: 5'  9" (1.753 m)   Wt Readings from Last 3 Encounters:  12/29/18 235 lb (106.6 kg)  09/25/18 242 lb 12.8 oz (110.1 kg)  05/26/18 228 lb (103.4 kg)   Physical Exam Constitutional:      General: He is not in acute distress.    Appearance: He is well-developed.  HENT:     Head: Normocephalic and atraumatic.     Right Ear: Hearing and tympanic membrane normal.     Left Ear: Hearing and tympanic membrane normal.     Nose: Nose normal.     Comments: Tender maxillary sinuses (R>L) with no transillumination bilaterally.    Mouth/Throat:     Pharynx: Oropharynx is clear.  Eyes:     General: Lids are normal. No scleral icterus.       Right eye: No discharge.        Left eye: No discharge.     Conjunctiva/sclera: Conjunctivae normal.  Neck:     Musculoskeletal: Neck supple.  Cardiovascular:     Rate and Rhythm: Normal rate  and regular rhythm.  Pulmonary:     Effort: Pulmonary effort is normal. No respiratory distress.  Musculoskeletal: Normal range of motion.  Lymphadenopathy:     Cervical: No cervical adenopathy.  Skin:    Findings: No lesion or rash.  Neurological:     Mental Status: He is alert and oriented to person, place, and time.  Psychiatric:        Speech: Speech normal.        Behavior: Behavior normal.        Thought Content: Thought content normal.       Assessment & Plan    1. Acute maxillary sinusitis, recurrence not specified Developed congestion, stopped up ear sensation, PND, sinus pressure discomfort, occasional dizziness with sneezing and one episode of epistaxis from the right nostril Monday 12-25-18. Continue Flonase, may use plain Mucinex and add Meclizine for nausea from PND. Add Amoxil for the next 10 days. Reminded him to maintain social distancing, stay at home, wash hands frequently and wear facial covering when he leaves the home. If no better in 4 days, may need prednisone taper for sinus inflammation. Recheck prn. - amoxicillin (AMOXIL) 875 MG tablet; Take 1 tablet (875 mg total) by mouth 2 (two) times daily.  Dispense: 20 tablet; Refill: 0  2. Chronic combined systolic and diastolic heart failure (HCC) Stable and well controlled with Carvedilol, Demadex and Lisinopril. BP controlled by Spironolactone with above meds and A.fib controlled by Carvedilol with Xarelto for anticoagulation. No peripheral edema or dyspnea today. Continue follow up with cardiologist (Dr. Tammi Klippel).     Vernie Murders, PA  [facility]

## 2018-12-29 NOTE — Telephone Encounter (Signed)
Scheduled appt to be seen in the office per patient.

## 2019-04-05 ENCOUNTER — Other Ambulatory Visit: Payer: Self-pay

## 2019-04-05 DIAGNOSIS — R310 Gross hematuria: Secondary | ICD-10-CM

## 2019-04-06 ENCOUNTER — Ambulatory Visit (INDEPENDENT_AMBULATORY_CARE_PROVIDER_SITE_OTHER): Payer: Medicare Other | Admitting: Urology

## 2019-04-06 ENCOUNTER — Other Ambulatory Visit: Payer: Self-pay

## 2019-04-06 ENCOUNTER — Other Ambulatory Visit
Admission: RE | Admit: 2019-04-06 | Discharge: 2019-04-06 | Disposition: A | Discharge status: Home / Self Care | Payer: Medicare Other | Attending: Urology | Admitting: Urology

## 2019-04-06 ENCOUNTER — Encounter: Payer: Self-pay | Admitting: Urology

## 2019-04-06 VITALS — BP 156/96 | HR 67 | Ht 69.0 in | Wt 232.0 lb

## 2019-04-06 DIAGNOSIS — R310 Gross hematuria: Secondary | ICD-10-CM | POA: Diagnosis present

## 2019-04-06 DIAGNOSIS — R109 Unspecified abdominal pain: Secondary | ICD-10-CM | POA: Diagnosis not present

## 2019-04-06 DIAGNOSIS — R3129 Other microscopic hematuria: Secondary | ICD-10-CM | POA: Diagnosis not present

## 2019-04-06 LAB — URINALYSIS, COMPLETE (UACMP) WITH MICROSCOPIC
Bacteria, UA: NONE SEEN
Bilirubin Urine: NEGATIVE
Glucose, UA: 500 mg/dL — AB
Ketones, ur: NEGATIVE mg/dL
Nitrite: NEGATIVE
Protein, ur: NEGATIVE mg/dL
Specific Gravity, Urine: 1.02 (ref 1.005–1.030)
Squamous Epithelial / LPF: NONE SEEN (ref 0–5)
pH: 5 (ref 5.0–8.0)

## 2019-04-06 NOTE — Progress Notes (Signed)
04/06/2019 8:10 AM   Marissa Nestle Justman 05-17-1956 [id number]  Referring provider: Margo Common, [place] [place] [place],  [place] [address]  Chief Complaint  Patient presents with  . Hematuria  . Flank Pain    HPI: 63 year old male who returns today with continued episodes of gross hematuria and associated right flank pain.  He was seen and evaluated for very similar issue in 11/2017 at which time further work-up including CT urogram and cystoscopy were ordered and recommended.  He reports that his symptoms subsided this he never followed through with these recommendations.  In the interim, he has had several more episodes of painless gross hematuria that started 1 to 2 months ago.  He is also had intermittent episodes of right flank pain which are exacerbated with activity.  The timing of this seems to be unrelated to the gross hematuria.  Is not severe.  He was describes pain as dull and aching.  This improves with rest.  He does have a history of A. fib on anticoagulation.  He is a former heavy smoker.  No history of kidney stones.  No fevers or chills.  No dysuria or UTIs.  Most recent PSA 0.9 2017.   PMH: Past Medical History:  Diagnosis Date  . A-fib ([place])   . Heart failure ([place])   . Hyperlipemia   . Hypertension   . Sleep apnea     Surgical History: Past Surgical History:  Procedure Laterality Date  . COLECTOMY  07/2011  . Implant of defibrillator Left 08-22-15  . KNEE SURGERY Right 2006  . TONSILLECTOMY AND ADENOIDECTOMY  1960    Home Medications:  Allergies as of 04/06/2019      Reactions   Niacin Hives   Other reaction(s): Other (See Comments) Hot flashes      Medication List       Accurate as of April 06, 2019 11:59 PM. If you have any questions, ask your nurse or doctor.        amoxicillin 875 MG tablet Commonly known as: AMOXIL Take 1 tablet (875 mg total) by mouth 2 (two) times daily.   atorvastatin 40 MG tablet Commonly known  as: LIPITOR TAKE 1 TABLET (40 MG TOTAL) BY MOUTH ONCE DAILY.   carvedilol 25 MG tablet Commonly known as: COREG Take 25 mg by mouth 2 (two) times daily.   lisinopril 10 MG tablet Commonly known as: ZESTRIL Take 20 mg by mouth 2 (two) times daily.   rivaroxaban 20 MG Tabs tablet Commonly known as: XARELTO Take 20 mg by mouth daily with supper.   spironolactone 25 MG tablet Commonly known as: ALDACTONE TAKE 2 TABLET (50 MG TOTAL) BY MOUTH ONCE DAILY.   torsemide 20 MG tablet Commonly known as: DEMADEX TAKE 1 TABLETS BY MOUTH ONCE DAILY PRN weight gain >230       Allergies:  Allergies  Allergen Reactions  . Niacin Hives    Other reaction(s): Other (See Comments) Hot flashes    Family History: Family History  Problem Relation Age of Onset  . Diabetes Mother   . Hypertension Mother   . Kidney disease Mother   . Hyperlipidemia Mother   . Lung cancer Father   . Prostate cancer Father   . Congestive Heart Failure Maternal Grandmother   . Congestive Heart Failure Paternal Grandmother     Social History:  reports that he has quit smoking. His smoking use included cigarettes. He has a 33.00 pack-year smoking history. He has never used  smokeless tobacco. He reports that he does not drink alcohol or use drugs.  ROS: UROLOGY Frequent Urination?: No Hard to postpone urination?: No Burning/pain with urination?: No Get up at night to urinate?: No Leakage of urine?: No Urine stream starts and stops?: No Trouble starting stream?: No Do you have to strain to urinate?: No Blood in urine?: Yes Urinary tract infection?: No Sexually transmitted disease?: No Injury to kidneys or bladder?: No Painful intercourse?: No Weak stream?: No Erection problems?: No Penile pain?: No  Gastrointestinal Nausea?: No Vomiting?: No Indigestion/heartburn?: No Diarrhea?: No Constipation?: No  Constitutional Fever: No Night sweats?: No Weight loss?: No Fatigue?: No  Skin Skin  rash/lesions?: No Itching?: No  Eyes Blurred vision?: No Double vision?: No  Ears/Nose/Throat Sore throat?: No Sinus problems?: No  Hematologic/Lymphatic Swollen glands?: No Easy bruising?: No  Cardiovascular Leg swelling?: No Chest pain?: No  Respiratory Cough?: No Shortness of breath?: No  Endocrine Excessive thirst?: No  Musculoskeletal Back pain?: No Joint pain?: No  Neurological Headaches?: No Dizziness?: No  Psychologic Depression?: No Anxiety?: No  Physical Exam: BP (!) 156/96   Pulse 67   Ht 5\' 9"  (1.753 m)   Wt 232 lb (105.2 kg)   BMI 34.26 kg/m   Constitutional:  Alert and oriented, No acute distress. HEENT: [place] AT, moist mucus membranes.  Trachea midline, no masses. Cardiovascular: No clubbing, cyanosis, or edema. Respiratory: Normal respiratory effort, no increased work of breathing. GI: Abdomen is soft, nontender, nondistended, no abdominal masses GU: No CVA tenderness.  Obese. Skin: No rashes, bruises or suspicious lesions. Neurologic: Grossly intact, no focal deficits, moving all 4 extremities. Psychiatric: Normal mood and affect.  Laboratory Data: Lab Results  Component Value Date   WBC 8.1 12/06/2017   HGB 12.0 (L) 12/06/2017   HCT 37.4 (L) 12/06/2017   MCV 90 12/06/2017   PLT 171 12/06/2017    Lab Results  Component Value Date   CREATININE 1.03 12/06/2017    Lab Results  Component Value Date   HGBA1C 6.9 (H) 06/09/2016    Urinalysis Results for orders placed or performed during the hospital encounter of 04/06/19  Urinalysis, Complete w Microscopic  Result Value Ref Range   Color, Urine YELLOW YELLOW   APPearance CLEAR CLEAR   Specific Gravity, Urine 1.020 1.005 - 1.030   pH 5.0 5.0 - 8.0   Glucose, UA 500 (A) NEGATIVE mg/dL   Hgb urine dipstick MODERATE (A) NEGATIVE   Bilirubin Urine NEGATIVE NEGATIVE   Ketones, ur NEGATIVE NEGATIVE mg/dL   Protein, ur NEGATIVE NEGATIVE mg/dL   Nitrite NEGATIVE NEGATIVE    Leukocytes,Ua TRACE (A) NEGATIVE   Squamous Epithelial / LPF NONE SEEN 0 - 5   WBC, UA 0-5 0 - 5 WBC/hpf   RBC / HPF 21-50 0 - 5 RBC/hpf   Bacteria, UA NONE SEEN NONE SEEN     Pertinent Imaging: nA  Assessment & Plan:    1. Gross hematuria Given his history of ongoing gross and microscopic hematuria as well as remote smoking history, I quite adamantly recommended CT urogram and cystoscopy today.  He never followed through last year after having similar presentation and given his ongoing issues, his is now interested in proceeding.  He understands the differential diagnosis as well as my concern about underlying malignancy/stones, etc.  CT urogram was ordered.  We will have him return in a few weeks to review this report as well as perform cystoscopy.  He is agreeable this plan.  All  questions were answered.  Warning symptoms are reviewed. - CT HEMATURIA WORKUP; Future  2. Microscopic hematuria As above  3. Right flank pain As above  Fu CT scan/ cystoscopy in 3-4 weeks  Hollice Espy, MD  [facility] [address] [place], [place] [address] [phone]

## 2019-04-13 ENCOUNTER — Ambulatory Visit
Admission: RE | Admit: 2019-04-13 | Discharge: 2019-04-13 | Disposition: A | Discharge status: Home / Self Care | Payer: Medicare Other | Source: Ambulatory Visit | Attending: Urology | Admitting: Urology

## 2019-04-13 ENCOUNTER — Other Ambulatory Visit
Admission: RE | Admit: 2019-04-13 | Discharge: 2019-04-13 | Disposition: A | Discharge status: Home / Self Care | Payer: Medicare Other | Source: Home / Self Care | Attending: Urology | Admitting: Urology

## 2019-04-13 ENCOUNTER — Other Ambulatory Visit: Payer: Self-pay

## 2019-04-13 DIAGNOSIS — R310 Gross hematuria: Secondary | ICD-10-CM

## 2019-04-13 HISTORY — DX: Heart failure, unspecified: I50.9

## 2019-04-13 LAB — CREATININE, SERUM
Creatinine, Ser: 1.11 mg/dL (ref 0.61–1.24)
GFR calc Af Amer: >60 mL/min (ref >60)
GFR calc non Af Amer: >60 mL/min (ref >60)

## 2019-04-13 MED ORDER — IOHEXOL 300 MG/ML  SOLN
125.0000 mL | Freq: Once | INTRAMUSCULAR | Status: AC | PRN
  Administered 2019-04-13: 125 mL via INTRAVENOUS

## 2019-04-16 ENCOUNTER — Telehealth: Payer: Self-pay

## 2019-04-16 NOTE — Telephone Encounter (Signed)
-----   Message from Hollice Espy, MD sent at 04/13/2019  1:10 PM EDT ----- Nonobstructing stone in the right kidney otherwise the CT scan is unremarkable.  He spoke to come back for office cystoscopy to rule out bladder cancer but does not look like this is scheduled.  Please schedule this in my next available in [place].  We will discuss the kidney stone at that visit.  Hollice Espy, MD

## 2019-04-16 NOTE — Telephone Encounter (Signed)
Patient notified and cysto scheduled

## 2019-04-27 ENCOUNTER — Other Ambulatory Visit: Payer: Self-pay

## 2019-04-27 ENCOUNTER — Ambulatory Visit (INDEPENDENT_AMBULATORY_CARE_PROVIDER_SITE_OTHER): Payer: Medicare Other | Admitting: Urology

## 2019-04-27 ENCOUNTER — Encounter: Payer: Self-pay | Admitting: Urology

## 2019-04-27 ENCOUNTER — Other Ambulatory Visit
Admission: RE | Admit: 2019-04-27 | Discharge: 2019-04-27 | Disposition: A | Discharge status: Home / Self Care | Payer: Medicare Other | Attending: Urology | Admitting: Urology

## 2019-04-27 VITALS — BP 148/98 | HR 74 | Ht 69.0 in | Wt 230.0 lb

## 2019-04-27 DIAGNOSIS — I868 Varicose veins of other specified sites: Secondary | ICD-10-CM

## 2019-04-27 DIAGNOSIS — N200 Calculus of kidney: Secondary | ICD-10-CM | POA: Diagnosis not present

## 2019-04-27 DIAGNOSIS — R310 Gross hematuria: Secondary | ICD-10-CM | POA: Insufficient documentation

## 2019-04-27 LAB — URINALYSIS, COMPLETE (UACMP) WITH MICROSCOPIC
Bacteria, UA: NONE SEEN
Bilirubin Urine: NEGATIVE
Glucose, UA: NEGATIVE mg/dL
Ketones, ur: NEGATIVE mg/dL
Leukocytes,Ua: NEGATIVE
Nitrite: NEGATIVE
Protein, ur: NEGATIVE mg/dL
Specific Gravity, Urine: 1.025 (ref 1.005–1.030)
Squamous Epithelial / HPF: NONE SEEN (ref 0–5)
WBC, UA: NONE SEEN WBC/hpf (ref 0–5)
pH: 5 (ref 5.0–8.0)

## 2019-04-27 MED ORDER — FINASTERIDE 5 MG PO TABS: 5.0000 mg | ORAL_TABLET | Freq: Every day | ORAL | 11 refills | Status: DC

## 2019-04-27 NOTE — H&P (View-Only) (Signed)
   04/27/19  CC:  Chief Complaint  Patient presents with  . Cysto    HPI: 63 year old former smoker with episodic gross hematuria who presents today for cystoscopy.  In the interim, he is undergone a CT urogram on 04/13/2019 which is personally reviewed today.  No GU pathology identified other than a 9 mm right mid pole nonobstructing calculus.  No filling defects or ureteral lesions appreciated.  Notably, he has had intermittent right flank pain associated with his episodes of bleeding.  His bleeding also is exacerbated with physical activity, heavy lifting and straining.  He does have a history of proximal A. fib on Xarelto.  Blood pressure (!) 148/98, pulse 74, height 5\' 9"  (1.753 m), weight 230 lb (104.3 kg). NED. A&Ox3.   No respiratory distress   Abd soft, NT, ND Normal phallus with bilateral descended testicles  Cystoscopy Procedure Note  Patient identification was confirmed, informed consent was obtained, and patient was prepped using Betadine solution.  Lidocaine jelly was administered per urethral meatus.     Pre-Procedure: - Inspection reveals a normal caliber ureteral meatus.  Procedure: The flexible cystoscope was introduced without difficulty - No urethral strictures/lesions are present. - Enlarged prostate with friable prostatic mucosa, bleeding with endoscopic manipulation - Normal bladder neck - Bilateral ureteral orifices identified - Bladder mucosa  reveals no ulcers, tumors, or lesions - No bladder stones -Mild trabeculation  Retroflexion unremarkable with some bleeding noted at the bladder neck   Post-Procedure: - Patient tolerated the procedure well  Assessment/ Plan:  1. Gross hematuria Either related to enlarged/friable prostatic vessels versus kidney stone Both of these are likely exacerbated by anticoagulation See below  2. Right kidney stone Intermittent episodes of right flank pain  May be associated with nonobstructing 9 mm stone  He may be interested in prophylactic treatment for the stone given that I will be unlikely to pass without surgical intervention which seems reasonable  We discussed various treatment options including ESWL vs. ureteroscopy, laser lithotripsy, and stent. We discussed the risks and benefits of both including bleeding, infection, damage to surrounding structures, efficacy with need for possible further intervention, and need for temporary ureteral stent.  Although the patient has a larger habitus, a stone to skin distance is only 13 cm and Hounsfield units are approximately 700.  He is a reasonable candidate for shockwave lithotripsy and is more interested in this in order to avoid general anesthesia and more invasive intervention.  He understands that the stone free rate is lower for ESWL then ureteroscopy and we discussed the risk and benefits of each.  He would like to be booked for ESWL in a few weeks from now.  We will need to have him cleared by cardiology to hold his anticoagulation/antiplatelet therapy prior to this.  All questions answered.  3. Prostate varices Dilated blood vessels on the surface of the prostate, may be underlying etiology of painless gross hematuria related to physical activity  Although he has minimal urinary symptoms, I recommended consideration of finasteride which can help shrink his prostate as well as decrease the amount of bleeding episodes related to prostamegaly.  He would like to start finasteride.  He understands the mechanism of action of this drug and the onset which is relatively slow.   Schedule right ESWL  Hollice Espy, MD

## 2019-04-27 NOTE — Progress Notes (Signed)
   04/27/19  CC:  Chief Complaint  Patient presents with  . Cysto    HPI: 63 year old former smoker with episodic gross hematuria who presents today for cystoscopy.  In the interim, he is undergone a CT urogram on 04/13/2019 which is personally reviewed today.  No GU pathology identified other than a 9 mm right mid pole nonobstructing calculus.  No filling defects or ureteral lesions appreciated.  Notably, he has had intermittent right flank pain associated with his episodes of bleeding.  His bleeding also is exacerbated with physical activity, heavy lifting and straining.  He does have a history of proximal A. fib on Xarelto.  Blood pressure (!) 148/98, pulse 74, height 5\' 9"  (1.753 m), weight 230 lb (104.3 kg). NED. A&Ox3.   No respiratory distress   Abd soft, NT, ND Normal phallus with bilateral descended testicles  Cystoscopy Procedure Note  Patient identification was confirmed, informed consent was obtained, and patient was prepped using Betadine solution.  Lidocaine jelly was administered per urethral meatus.     Pre-Procedure: - Inspection reveals a normal caliber ureteral meatus.  Procedure: The flexible cystoscope was introduced without difficulty - No urethral strictures/lesions are present. - Enlarged prostate with friable prostatic mucosa, bleeding with endoscopic manipulation - Normal bladder neck - Bilateral ureteral orifices identified - Bladder mucosa  reveals no ulcers, tumors, or lesions - No bladder stones -Mild trabeculation  Retroflexion unremarkable with some bleeding noted at the bladder neck   Post-Procedure: - Patient tolerated the procedure well  Assessment/ Plan:  1. Gross hematuria Either related to enlarged/friable prostatic vessels versus kidney stone Both of these are likely exacerbated by anticoagulation See below  2. Right kidney stone Intermittent episodes of right flank pain  May be associated with nonobstructing 9 mm stone  He may be interested in prophylactic treatment for the stone given that I will be unlikely to pass without surgical intervention which seems reasonable  We discussed various treatment options including ESWL vs. ureteroscopy, laser lithotripsy, and stent. We discussed the risks and benefits of both including bleeding, infection, damage to surrounding structures, efficacy with need for possible further intervention, and need for temporary ureteral stent.  Although the patient has a larger habitus, a stone to skin distance is only 13 cm and Hounsfield units are approximately 700.  He is a reasonable candidate for shockwave lithotripsy and is more interested in this in order to avoid general anesthesia and more invasive intervention.  He understands that the stone free rate is lower for ESWL then ureteroscopy and we discussed the risk and benefits of each.  He would like to be booked for ESWL in a few weeks from now.  We will need to have him cleared by cardiology to hold his anticoagulation/antiplatelet therapy prior to this.  All questions answered.  3. Prostate varices Dilated blood vessels on the surface of the prostate, may be underlying etiology of painless gross hematuria related to physical activity  Although he has minimal urinary symptoms, I recommended consideration of finasteride which can help shrink his prostate as well as decrease the amount of bleeding episodes related to prostamegaly.  He would like to start finasteride.  He understands the mechanism of action of this drug and the onset which is relatively slow.   Schedule right ESWL  Hollice Espy, MD

## 2019-05-10 ENCOUNTER — Other Ambulatory Visit: Payer: Self-pay | Admitting: *Deleted

## 2019-05-10 DIAGNOSIS — R310 Gross hematuria: Secondary | ICD-10-CM

## 2019-05-11 ENCOUNTER — Other Ambulatory Visit: Payer: Self-pay

## 2019-05-11 ENCOUNTER — Other Ambulatory Visit: Payer: Medicare Other

## 2019-05-11 DIAGNOSIS — R310 Gross hematuria: Secondary | ICD-10-CM

## 2019-05-11 LAB — URINALYSIS, COMPLETE
Bilirubin, UA: NEGATIVE
Ketones, UA: NEGATIVE
Nitrite, UA: NEGATIVE
Protein,UA: NEGATIVE
Specific Gravity, UA: 1.025 (ref 1.005–1.030)
Urobilinogen, Ur: 0.2 mg/dL (ref 0.2–1.0)
pH, UA: 5 (ref 5.0–7.5)

## 2019-05-11 LAB — MICROSCOPIC EXAMINATION: RBC, Urine: >30 /hpf — AB (ref 0–2)

## 2019-05-14 ENCOUNTER — Encounter
Admission: RE | Admit: 2019-05-14 | Discharge: 2019-05-14 | Disposition: A | Discharge status: Home / Self Care | Payer: Medicare Other | Source: Ambulatory Visit | Attending: Urology | Admitting: Urology

## 2019-05-14 ENCOUNTER — Other Ambulatory Visit: Payer: Self-pay

## 2019-05-14 DIAGNOSIS — Z20828 Contact with and (suspected) exposure to other viral communicable diseases: Secondary | ICD-10-CM | POA: Insufficient documentation

## 2019-05-14 DIAGNOSIS — N200 Calculus of kidney: Secondary | ICD-10-CM | POA: Insufficient documentation

## 2019-05-14 DIAGNOSIS — Z01812 Encounter for preprocedural laboratory examination: Secondary | ICD-10-CM | POA: Insufficient documentation

## 2019-05-14 HISTORY — DX: Cardiac arrhythmia, unspecified: I49.9

## 2019-05-14 HISTORY — DX: Personal history of urinary calculi: Z87.442

## 2019-05-14 HISTORY — DX: Family history of other specified conditions: Z84.89

## 2019-05-14 NOTE — Patient Instructions (Signed)
INSTRUCTIONS FOR SURGERY     Your surgery is scheduled for:   Thursday, May 17, 2019    To find out your arrival time for the day of surgery,          please call [phone] between 1 pm and 3 pm on : Wednesday, May 16, 2019     When you arrive for surgery, report to the [facility].       Do NOT stop on the first floor to register.    REMEMBER: Instructions that are not followed completely may result in serious medical risk,  up to and including death, or upon the discretion of your surgeon and anesthesiologist,            your surgery may need to be rescheduled.  __X__ 1. Do not eat food after midnight the night before your procedure.                    No gum, candy, lozenger, tic tacs, tums or hard candies.                  ABSOLUTELY NOTHING SOLID IN YOUR MOUTH AFTER MIDNIGHT                    You may drink unlimited clear liquids up to 2 hours before you are scheduled to arrive for surgery.                   Do not drink anything within those 2 hours unless you need to take medicine, then take the                   smallest amount you need.  Clear liquids include:  water, apple juice without pulp,                   any flavor Gatorade, Black coffee, black tea.  Sugar may be added but no dairy/ honey /lemon.                        Broth and jello is not considered a clear liquid.  __x__  2. On the morning of surgery, please brush your teeth with toothpaste and water. You may rinse with                  mouthwash if you wish but DO NOT SWALLOW TOOTHPASTE OR MOUTHWASH  __X___3. NO alcohol for 24 hours before or after surgery.  __x___ 4.  Do NOT smoke or use e-cigarettes for 24 HOURS PRIOR TO SURGERY.                      DO NOT Use any chewable tobacco products for at least 6 hours prior to surgery.  __x___ 5. If you start any new medication after this appointment and prior to surgery,  please                   Bring it with you on the day of surgery.  ___x__ 6. Notify your doctor if there is any change in  your medical condition, such as fever, infection, vomitting,                   Diarrhea or any open sores.   --n/a------- 7.  USE the CHG SOAP as instructed, the night before surgery and the day of surgery.                   Once you have washed with this soap, do NOT use any of the following: Powders, perfumes                    or lotions. Please do not wear make up, hairpins, clips or nail polish. You ______   wear deodorant.                   Men may shave their face and neck.  Women need to shave 48 hours prior to surgery.                   DO NOT wear ANY jewelry on the day of surgery. If there are rings that are too tight to                    remove easily, please address this prior to the surgery day. Piercings need to be removed.                                                                     NO METAL ON YOUR BODY.                    Do NOT bring any valuables.  If you came to Pre-Admit testing then you will not need license,                     insurance card or credit card.  If you will be staying overnight, please either leave your things in                     the car or have your family be responsible for these items.                     [facility] IS NOT RESPONSIBLE FOR BELONGINGS OR VALUABLES.  ___X__ 8. DO NOT wear contact lenses on surgery day.  You may not have dentures,                     Hearing aides, contacts or glasses in the operating room. These items can be                    Placed in the Recovery Room to receive immediately after surgery.  __x___ 9. IF YOU ARE SCHEDULED TO GO HOME ON THE SAME DAY, YOU MUST                   Have someone to drive you home and to stay with you  for the first 24 hours.                    Have an arrangement prior to arriving on surgery day.  ___x__ 10. Take the  following medications on the morning of  surgery with a sip of water:                              1. CARVEDILOL                     2.                      3.                     4.                     5.                     6.  __X___ 11.  Follow any instructions provided to you by your surgeon.                        Such as clear liquid bowel prep ... (MAG CITRATE ON Wednesday)  __X__  12. STOP XARELTO AS OF TODAY, May 14, 2019                       THIS INCLUDES BC POWDERS / GOODIES POWDER  __x___ 13. STOP Anti-inflammatories as of:  TODAY                      This includes IBUPROFEN / MOTRIN / ADVIL / ALEVE/ NAPROXYN                    YOU MAY TAKE TYLENOL ANY TIME PRIOR TO SURGERY.  _____ 14.  Stop supplements until after surgery.                     This includes:                 You may continue taking Vitamin B12 / Vitamin D3 but do not take on the morning of surgery.  __X___ 15. Bring your CPAP machine into preop with you on the morning of surgery.  ______17.  Continue to take the following medications but do not take on the morning of surgery:                       LISINOPRIL / ALDACTONE / PROSCAR  ______18. If staying overnight, please have appropriate shoes to wear to be able to walk around the unit.                   Wear clean and comfortable clothing to the hospital.

## 2019-05-15 LAB — CULTURE, URINE COMPREHENSIVE

## 2019-05-15 LAB — SARS CORONAVIRUS 2 (TAT 6-24 HRS): SARS Coronavirus 2: NEGATIVE

## 2019-05-16 ENCOUNTER — Telehealth: Payer: Self-pay

## 2019-05-16 MED ORDER — CIPROFLOXACIN HCL 500 MG PO TABS: 500.0000 mg | ORAL_TABLET | Freq: Two times a day (BID) | ORAL | 0 refills | Status: DC

## 2019-05-16 NOTE — Telephone Encounter (Signed)
-----   Message from Hollice Espy, MD sent at 05/16/2019  9:13 AM EDT ----- Patient grew a low volume of bacteria on his urine culture.  I do not suspect infection but given that he only having a procedure tomorrow, would like to start him on Cipro starting this morning.  Please prescribe Cipro 500 mg twice daily for total of a 5-day course.  Hollice Espy, MD

## 2019-05-16 NOTE — Telephone Encounter (Signed)
Patient notified and script sent 

## 2019-05-17 ENCOUNTER — Encounter
Admission: RE | Disposition: A | Discharge status: Home / Self Care | Payer: Self-pay | Source: Home / Self Care | Attending: Urology

## 2019-05-17 ENCOUNTER — Encounter: Payer: Self-pay | Admitting: Certified Registered Nurse Anesthetist

## 2019-05-17 ENCOUNTER — Other Ambulatory Visit: Payer: Self-pay

## 2019-05-17 ENCOUNTER — Ambulatory Visit: Payer: Medicare Other

## 2019-05-17 ENCOUNTER — Ambulatory Visit
Admission: RE | Admit: 2019-05-17 | Discharge: 2019-05-17 | Disposition: A | Discharge status: Home / Self Care | Payer: Medicare Other | Attending: Urology | Admitting: Urology

## 2019-05-17 ENCOUNTER — Encounter: Payer: Self-pay | Admitting: *Deleted

## 2019-05-17 DIAGNOSIS — Z7901 Long term (current) use of anticoagulants: Secondary | ICD-10-CM | POA: Diagnosis not present

## 2019-05-17 DIAGNOSIS — N200 Calculus of kidney: Secondary | ICD-10-CM | POA: Insufficient documentation

## 2019-05-17 DIAGNOSIS — I868 Varicose veins of other specified sites: Secondary | ICD-10-CM | POA: Diagnosis not present

## 2019-05-17 DIAGNOSIS — Z87891 Personal history of nicotine dependence: Secondary | ICD-10-CM | POA: Diagnosis not present

## 2019-05-17 DIAGNOSIS — I480 Paroxysmal atrial fibrillation: Secondary | ICD-10-CM | POA: Insufficient documentation

## 2019-05-17 DIAGNOSIS — R310 Gross hematuria: Secondary | ICD-10-CM | POA: Insufficient documentation

## 2019-05-17 HISTORY — PX: EXTRACORPOREAL SHOCK WAVE LITHOTRIPSY: SHX1557

## 2019-05-17 SURGERY — LITHOTRIPSY, ESWL
Anesthesia: Monitor Anesthesia Care | Laterality: Right

## 2019-05-17 MED ORDER — SODIUM CHLORIDE 0.9 % IV SOLN
INTRAVENOUS | Status: DC
  Administered 2019-05-17: 09:00:00 via INTRAVENOUS

## 2019-05-17 MED ORDER — HYDROCODONE-ACETAMINOPHEN 5-325 MG PO TABS: 1.0000 | ORAL_TABLET | Freq: Four times a day (QID) | ORAL | 0 refills | Status: DC | PRN

## 2019-05-17 MED ORDER — DIAZEPAM 5 MG PO TABS
ORAL_TABLET | ORAL | Status: AC
  Administered 2019-05-17: 10 mg via ORAL
  Filled 2019-05-17: qty 2

## 2019-05-17 MED ORDER — DIPHENHYDRAMINE HCL 25 MG PO CAPS
ORAL_CAPSULE | ORAL | Status: AC
  Administered 2019-05-17: 25 mg via ORAL
  Filled 2019-05-17: qty 1

## 2019-05-17 MED ORDER — DOCUSATE SODIUM 100 MG PO CAPS: 100.0000 mg | ORAL_CAPSULE | Freq: Two times a day (BID) | ORAL | 0 refills | Status: DC

## 2019-05-17 MED ORDER — CIPROFLOXACIN HCL 500 MG PO TABS
ORAL_TABLET | ORAL | Status: AC
  Administered 2019-05-17: 500 mg via ORAL
  Filled 2019-05-17: qty 1

## 2019-05-17 MED ORDER — ONDANSETRON HCL 4 MG/2ML IJ SOLN
4.0000 mg | Freq: Once | INTRAMUSCULAR | Status: AC
  Administered 2019-05-17: 10:00:00 4 mg via INTRAVENOUS

## 2019-05-17 MED ORDER — DIAZEPAM 5 MG PO TABS
10.0000 mg | ORAL_TABLET | ORAL | Status: AC
  Administered 2019-05-17: 10:00:00 10 mg via ORAL

## 2019-05-17 MED ORDER — CIPROFLOXACIN HCL 500 MG PO TABS
500.0000 mg | ORAL_TABLET | Freq: Once | ORAL | Status: AC
  Administered 2019-05-17: 10:00:00 500 mg via ORAL

## 2019-05-17 MED ORDER — DIPHENHYDRAMINE HCL 25 MG PO CAPS
25.0000 mg | ORAL_CAPSULE | ORAL | Status: AC
  Administered 2019-05-17: 10:00:00 25 mg via ORAL

## 2019-05-17 MED ORDER — TAMSULOSIN HCL 0.4 MG PO CAPS: 0.4000 mg | ORAL_CAPSULE | Freq: Every day | ORAL | 0 refills | Status: DC

## 2019-05-17 MED ORDER — ONDANSETRON HCL 4 MG/2ML IJ SOLN
INTRAMUSCULAR | Status: AC
  Administered 2019-05-17: 4 mg via INTRAVENOUS
  Filled 2019-05-17: qty 2

## 2019-05-17 NOTE — Discharge Instructions (Signed)
See [facility] discharge instructions in chart.  

## 2019-05-18 NOTE — Interval H&P Note (Signed)
History and Physical Interval Note:  05/18/2019 8:31 AM  Robert Grant  has presented today for surgery, with the diagnosis of Kidney stone.  The various methods of treatment have been discussed with the patient and family. After consideration of risks, benefits and other options for treatment, the patient has consented to  Procedure(s): EXTRACORPOREAL SHOCK WAVE LITHOTRIPSY (ESWL) (Right) as a surgical intervention.  The patient's history has been reviewed, patient examined, no change in status, stable for surgery.  I have reviewed the patient's chart and labs.  Questions were answered to the patient's satisfaction.     Hollice Espy

## 2019-05-30 ENCOUNTER — Encounter: Payer: Self-pay | Admitting: Physician Assistant

## 2019-05-30 ENCOUNTER — Other Ambulatory Visit: Payer: Self-pay

## 2019-05-30 ENCOUNTER — Ambulatory Visit (INDEPENDENT_AMBULATORY_CARE_PROVIDER_SITE_OTHER): Payer: Medicare Other | Admitting: Physician Assistant

## 2019-05-30 ENCOUNTER — Ambulatory Visit
Admission: RE | Admit: 2019-05-30 | Discharge: 2019-05-30 | Disposition: A | Discharge status: Home / Self Care | Payer: Medicare Other | Attending: Urology | Admitting: Urology

## 2019-05-30 ENCOUNTER — Ambulatory Visit
Admission: RE | Admit: 2019-05-30 | Discharge: 2019-05-30 | Disposition: A | Discharge status: Home / Self Care | Payer: Medicare Other | Source: Ambulatory Visit | Attending: Urology | Admitting: Urology

## 2019-05-30 VITALS — BP 165/84 | HR 85 | Ht 69.0 in | Wt 239.8 lb

## 2019-05-30 DIAGNOSIS — N200 Calculus of kidney: Secondary | ICD-10-CM

## 2019-05-30 LAB — MICROSCOPIC EXAMINATION

## 2019-05-30 LAB — URINALYSIS, COMPLETE
Bilirubin, UA: NEGATIVE
Ketones, UA: NEGATIVE
Leukocytes,UA: NEGATIVE
Nitrite, UA: NEGATIVE
Protein,UA: NEGATIVE
Specific Gravity, UA: 1.02 (ref 1.005–1.030)
Urobilinogen, Ur: 0.2 mg/dL (ref 0.2–1.0)
pH, UA: 5 (ref 5.0–7.5)

## 2019-05-30 NOTE — Progress Notes (Addendum)
05/30/2019 8:56 AM   Marissa Nestle Weinhold Oct 19, 1955 [id number]  CC: Postop ESWL  HPI: Robert Grant is a 63 y.o. male who presents for postoperative evaluation s/p ESWL with Dr. Erlene Quan on 05/17/2019.  He previously sought care in our clinic for gross hematuria and right flank pain.  Dr. Erlene Quan ordered hematuria work-up.  CT urogram revealed a 9 mm nonobstructing right renal stone.  Patient elected to proceed with ESWL for management of this.  He subsequently underwent stone management as described above. There were no complications and stone appeared smudged at the end of the procedure.  He was treated with a course of Cipro 500 mg twice daily for 5 days perioperatively for a low volume preoperative urine culture without infective symptoms.   In the interim, he reports passing 10-15 stone fragments with intermittent right flank pain and gross hematuria. He denies fevers, chills, and dysuria. He states overall his pain is improved from prior to the procedure.  He stopped Xarelto prior to ESWL and resumed it on 9/21. He has taken daily Flomax as prescribed.  Follow-up KUB today revealed persistence of the right renal stone with possible fragmentation present superiorly. In-office UA today positive for 1+ blood; urine microscopy with 3-10 RBCs/HPF and otherwise negative.  PMH: Past Medical History:  Diagnosis Date  . A-fib ([place])   . AICD (automatic cardioverter/defibrillator) present 07/2015   AF, CHF  . CHF (congestive heart failure) ([place])   . Dysrhythmia    Afib  . Family history of adverse reaction to anesthesia   . Heart failure ([place])   . History of kidney stones   . Hyperlipemia   . Hypertension   . Sleep apnea    uses cpap    Surgical History: Past Surgical History:  Procedure Laterality Date  . APPENDECTOMY  2012  . COLECTOMY  07/2011  . EXTRACORPOREAL SHOCK WAVE LITHOTRIPSY Right 05/17/2019   Procedure: EXTRACORPOREAL SHOCK WAVE LITHOTRIPSY (ESWL);  Surgeon:  Hollice Espy, MD;  Location: [facility] ORS;  Service: Urology;  Laterality: Right;  . Implant of defibrillator Left 08-22-15  . KNEE ARTHROSCOPY Left 10/2018   torn meniscus,  workers comp  . KNEE SURGERY Right 2006  . TONSILLECTOMY AND ADENOIDECTOMY  1960    Home Medications:  Allergies as of 05/30/2019      Reactions   Niacin Hives   Other reaction(s): Other (See Comments) Hot flashes   Spironolactone Other (See Comments)   gynecomastia      Medication List       Accurate as of May 30, 2019 11:59 PM. If you have any questions, ask your nurse or doctor.        STOP taking these medications   ciprofloxacin 500 MG tablet Commonly known as: CIPRO Stopped by: Debroah Loop, PA-C   docusate sodium 100 MG capsule Commonly known as: COLACE Stopped by: Debroah Loop, PA-C   finasteride 5 MG tablet Commonly known as: PROSCAR Stopped by: Debroah Loop, PA-C   HYDROcodone-acetaminophen 5-325 MG tablet Commonly known as: NORCO/VICODIN Stopped by: Debroah Loop, PA-C   lisinopril 10 MG tablet Commonly known as: ZESTRIL Stopped by: Debroah Loop, PA-C   spironolactone 25 MG tablet Commonly known as: ALDACTONE Stopped by: Debroah Loop, PA-C     TAKE these medications   atorvastatin 40 MG tablet Commonly known as: LIPITOR TAKE 1 TABLET (40 MG TOTAL) BY MOUTH ONCE DAILY.   carvedilol 25 MG tablet Commonly known as: COREG Take 25 mg by mouth 2 (two) times daily.  eplerenone 25 MG tablet Commonly known as: INSPRA Take 25 mg by mouth daily.   rivaroxaban 20 MG Tabs tablet Commonly known as: XARELTO Take 20 mg by mouth daily with supper.   tamsulosin 0.4 MG Caps capsule Commonly known as: Flomax Take 1 capsule (0.4 mg total) by mouth daily.   torsemide 20 MG tablet Commonly known as: DEMADEX TAKE 1 TABLETS BY MOUTH ONCE DAILY PRN weight gain >230      Allergies:  Allergies  Allergen Reactions  . Niacin  Hives    Other reaction(s): Other (See Comments) Hot flashes  . Spironolactone Other (See Comments)    gynecomastia   Family History: Family History  Problem Relation Age of Onset  . Diabetes Mother   . Hypertension Mother   . Kidney disease Mother   . Hyperlipidemia Mother   . Lung cancer Father   . Prostate cancer Father   . Congestive Heart Failure Maternal Grandmother   . Congestive Heart Failure Paternal Grandmother    Social History:  reports that he quit smoking about 36 years ago. His smoking use included cigarettes. He has a 22.00 pack-year smoking history. He has never used smokeless tobacco. He reports that he does not drink alcohol or use drugs.  ROS: UROLOGY Frequent Urination?: No Hard to postpone urination?: No Burning/pain with urination?: No Get up at night to urinate?: No Leakage of urine?: No Urine stream starts and stops?: No Trouble starting stream?: No Do you have to strain to urinate?: No Blood in urine?: Yes Urinary tract infection?: No Sexually transmitted disease?: No Injury to kidneys or bladder?: No Painful intercourse?: No Weak stream?: No Erection problems?: No Penile pain?: No  Gastrointestinal Nausea?: No Vomiting?: No Indigestion/heartburn?: No Diarrhea?: No Constipation?: No  Constitutional Fever: No Night sweats?: No Weight loss?: No Fatigue?: No  Skin Skin rash/lesions?: No Itching?: No  Eyes Blurred vision?: No Double vision?: No  Ears/Nose/Throat Sore throat?: No Sinus problems?: No  Hematologic/Lymphatic Swollen glands?: No Easy bruising?: No  Cardiovascular Leg swelling?: No Chest pain?: No  Respiratory Cough?: No Shortness of breath?: No  Endocrine Excessive thirst?: No  Musculoskeletal Back pain?: No Joint pain?: No  Neurological Headaches?: No Dizziness?: No  Psychologic Depression?: No Anxiety?: No  Physical Exam: BP (!) 165/84 (BP Location: Left Arm, Patient Position: Sitting, Cuff  Size: Normal)   Pulse 85   Ht 5\' 9"  (1.753 m)   Wt 239 lb 12.8 oz (108.8 kg)   BMI 35.41 kg/m   Constitutional:  Alert and oriented, No acute distress. HEENT: [place] AT, moist mucus membranes.  Trachea midline, no masses. Cardiovascular: No clubbing, cyanosis, or edema. Respiratory: Normal respiratory effort, no increased work of breathing. Skin: No rashes, bruises or suspicious lesions. Neurologic: Grossly intact, no focal deficits, moving all 4 extremities. Psychiatric: Normal mood and affect.  Laboratory Data: Results for orders placed or performed in visit on 05/30/19  Microscopic Examination   URINE  Result Value Ref Range   WBC, UA 0-5 0 - 5 /hpf   RBC 3-10 (A) 0 - 2 /hpf   Epithelial Cells (non renal) 0-10 0 - 10 /hpf   Bacteria, UA Few None seen/Few  Urinalysis, Complete  Result Value Ref Range   Specific Gravity, UA 1.020 1.005 - 1.030   pH, UA 5.0 5.0 - 7.5   Color, UA Yellow Yellow   Appearance Ur Clear Clear   Leukocytes,UA Negative Negative   Protein,UA Negative Negative/Trace   Glucose, UA 3+ (A) Negative  Ketones, UA Negative Negative   RBC, UA 1+ (A) Negative   Bilirubin, UA Negative Negative   Urobilinogen, Ur 0.2 0.2 - 1.0 mg/dL   Nitrite, UA Negative Negative   Microscopic Examination See below:    Pertinent Imaging: KUB 05/30/2019: CLINICAL DATA:  Follow-up kidney stones  EXAM: ABDOMEN - 1 VIEW  COMPARISON:  05/17/2019  FINDINGS: Scattered large and small bowel gas is noted. Right lower pole renal calculus is again identified measuring 9 mm in dimension. There is a tiny calcification adjacent to the larger stone which may represent a fragment from the recent lithotripsy. The overall density of the stone is less. No ureteral calculi are seen. Prosthetic calcifications are noted. Degenerative change of the lumbar spine is noted.  IMPRESSION: Slight fragmentation of the lower pole right renal stone when compared with the prior exam.    Electronically Signed   By: Inez Catalina M.D.   On: 05/30/2019 21:59  I personally reviewed the images referenced above and note persistence of the right renal stone with decreased density and adjacent fragmentation.  Assessment & Plan:   1. Right kidney stone Patient reports some passed fragments and improvement in right flank pain following ESWL 2 weeks ago for a 9 mm nonobstructing right renal stone.  Retained fragments visualized per KUB today.  I would like to give him another 2 weeks to pass any additional stone fragments before considering any follow-up treatments.  I counseled him to continue Flomax in the interim.  He expressed understanding. - DG Abd 1 View; Future - Urinalysis, Complete  Return in about 2 weeks (around 06/13/2019) for Postop f/u with KUB prior.  Debroah Loop, PA-C  [facility] [address], [place], [place] [address] [phone]

## 2019-06-08 ENCOUNTER — Other Ambulatory Visit: Payer: Self-pay | Admitting: Urology

## 2019-06-14 ENCOUNTER — Other Ambulatory Visit: Payer: Self-pay

## 2019-06-14 ENCOUNTER — Ambulatory Visit
Admission: RE | Admit: 2019-06-14 | Discharge: 2019-06-14 | Disposition: A | Discharge status: Home / Self Care | Payer: Medicare Other | Source: Ambulatory Visit | Attending: Physician Assistant | Admitting: Physician Assistant

## 2019-06-14 ENCOUNTER — Ambulatory Visit (INDEPENDENT_AMBULATORY_CARE_PROVIDER_SITE_OTHER): Payer: Medicare Other | Admitting: Urology

## 2019-06-14 ENCOUNTER — Encounter: Payer: Self-pay | Admitting: Urology

## 2019-06-14 VITALS — BP 157/77 | HR 87 | Ht 69.0 in | Wt 232.0 lb

## 2019-06-14 DIAGNOSIS — N200 Calculus of kidney: Secondary | ICD-10-CM | POA: Diagnosis present

## 2019-06-14 DIAGNOSIS — R310 Gross hematuria: Secondary | ICD-10-CM

## 2019-06-14 NOTE — Progress Notes (Signed)
06/14/2019 1:24 PM   Robert Grant Jul 03, 1956 [id number]  CC: Postop ESWL  HPI: Robert Grant is a 63 y.o. male who presents for postoperative evaluation s/p ESWL with Dr. Erlene Quan on 05/17/2019.  He previously sought care in our clinic for gross hematuria and right flank pain.  Dr. Erlene Quan ordered hematuria work-up.  CT urogram revealed a 9 mm nonobstructing right renal stone.  Patient elected to proceed with ESWL for management of this.  He subsequently underwent stone management as described above. There were no complications and stone appeared smudged at the end of the procedure.  He was treated with a course of Cipro 500 mg twice daily for 5 days perioperatively for a low volume preoperative urine culture without infective symptoms.   In the interim, he reports passing 10-15 stone fragments with intermittent right flank pain and gross hematuria. He denies fevers, chills, and dysuria. He states overall his pain is improved from prior to the procedure.  He stopped Xarelto prior to ESWL and resumed it on 9/21. He has taken daily Flomax as prescribed.  Follow-up KUB on 05/30/2019 revealed persistence of the right renal stone with possible fragmentation present superiorly.   Today, he brings in stone fragments for analysis.  He has not passed any further fragments since these fragments were collected a few days after the ESWL.   KUB today revealed that the right lower pole stone remains though it appears less dense.  He is still having some twinges of right sided flank pain with some gross hematuria, but he denies any intense flank pain. Patient denies any dysuria or suprapubic/flank pain.  Patient denies any fevers, chills, nausea or vomiting.    PMH: Past Medical History:  Diagnosis Date  . A-fib ([place])   . AICD (automatic cardioverter/defibrillator) present 07/2015   AF, CHF  . CHF (congestive heart failure) ([place])   . Dysrhythmia    Afib  . Family history of adverse reaction  to anesthesia   . Heart failure ([place])   . History of kidney stones   . Hyperlipemia   . Hypertension   . Sleep apnea    uses cpap    Surgical History: Past Surgical History:  Procedure Laterality Date  . APPENDECTOMY  2012  . COLECTOMY  07/2011  . EXTRACORPOREAL SHOCK WAVE LITHOTRIPSY Right 05/17/2019   Procedure: EXTRACORPOREAL SHOCK WAVE LITHOTRIPSY (ESWL);  Surgeon: Hollice Espy, MD;  Location: [facility] ORS;  Service: Urology;  Laterality: Right;  . Implant of defibrillator Left 08-22-15  . KNEE ARTHROSCOPY Left 10/2018   torn meniscus,  workers comp  . KNEE SURGERY Right 2006  . TONSILLECTOMY AND ADENOIDECTOMY  1960    Home Medications:  Allergies as of 06/14/2019      Reactions   Niacin Hives   Other reaction(s): Other (See Comments) Hot flashes   Spironolactone Other (See Comments)   gynecomastia      Medication List       Accurate as of June 14, 2019  1:24 PM. If you have any questions, ask your nurse or doctor.        atorvastatin 40 MG tablet Commonly known as: LIPITOR TAKE 1 TABLET (40 MG TOTAL) BY MOUTH ONCE DAILY.   carvedilol 25 MG tablet Commonly known as: COREG Take 25 mg by mouth 2 (two) times daily.   eplerenone 25 MG tablet Commonly known as: INSPRA Take 25 mg by mouth daily.   lisinopril 10 MG tablet Commonly known as: ZESTRIL   rivaroxaban 20  MG Tabs tablet Commonly known as: XARELTO Take 20 mg by mouth daily with supper.   tamsulosin 0.4 MG Caps capsule Commonly known as: FLOMAX Take 1 capsule (0.4 mg total) by mouth daily.   torsemide 20 MG tablet Commonly known as: DEMADEX TAKE 1 TABLETS BY MOUTH ONCE DAILY PRN weight gain >230      Allergies:  Allergies  Allergen Reactions  . Niacin Hives    Other reaction(s): Other (See Comments) Hot flashes  . Spironolactone Other (See Comments)    gynecomastia   Family History: Family History  Problem Relation Age of Onset  . Diabetes Mother   . Hypertension Mother   .  Kidney disease Mother   . Hyperlipidemia Mother   . Lung cancer Father   . Prostate cancer Father   . Congestive Heart Failure Maternal Grandmother   . Congestive Heart Failure Paternal Grandmother    Social History:  reports that he quit smoking about 36 years ago. His smoking use included cigarettes. He has a 22.00 pack-year smoking history. He has never used smokeless tobacco. He reports that he does not drink alcohol or use drugs.  ROS: UROLOGY Frequent Urination?: No Hard to postpone urination?: No Burning/pain with urination?: No Get up at night to urinate?: No Leakage of urine?: No Urine stream starts and stops?: No Trouble starting stream?: No Do you have to strain to urinate?: No Blood in urine?: Yes Urinary tract infection?: No Sexually transmitted disease?: No Injury to kidneys or bladder?: No Painful intercourse?: No Weak stream?: No Erection problems?: No Penile pain?: No  Gastrointestinal Nausea?: No Vomiting?: No Indigestion/heartburn?: No Diarrhea?: No Constipation?: No  Constitutional Fever: No Night sweats?: No Weight loss?: No Fatigue?: No  Skin Skin rash/lesions?: No Itching?: No  Eyes Blurred vision?: No Double vision?: No  Ears/Nose/Throat Sore throat?: No Sinus problems?: No  Hematologic/Lymphatic Swollen glands?: No Easy bruising?: No  Cardiovascular Leg swelling?: No Chest pain?: No  Respiratory Cough?: No Shortness of breath?: No  Endocrine Excessive thirst?: No  Musculoskeletal Back pain?: No Joint pain?: No  Neurological Headaches?: No Dizziness?: No  Psychologic Depression?: No Anxiety?: No  Physical Exam: BP (!) 157/77   Pulse 87   Ht 5\' 9"  (1.753 m)   Wt 232 lb (105.2 kg)   BMI 34.26 kg/m   Constitutional:  Alert and oriented, No acute distress. HEENT: [place] AT, moist mucus membranes.  Trachea midline, no masses. Cardiovascular: No clubbing, cyanosis, or edema. Respiratory: Normal respiratory effort,  no increased work of breathing. Skin: No rashes, bruises or suspicious lesions. Neurologic: Grossly intact, no focal deficits, moving all 4 extremities. Psychiatric: Normal mood and affect.  Laboratory Data: Results for orders placed or performed in visit on 05/30/19  Microscopic Examination   URINE  Result Value Ref Range   WBC, UA 0-5 0 - 5 /hpf   RBC 3-10 (A) 0 - 2 /hpf   Epithelial Cells (non renal) 0-10 0 - 10 /hpf   Bacteria, UA Few None seen/Few  Urinalysis, Complete  Result Value Ref Range   Specific Gravity, UA 1.020 1.005 - 1.030   pH, UA 5.0 5.0 - 7.5   Color, UA Yellow Yellow   Appearance Ur Clear Clear   Leukocytes,UA Negative Negative   Protein,UA Negative Negative/Trace   Glucose, UA 3+ (A) Negative   Ketones, UA Negative Negative   RBC, UA 1+ (A) Negative   Bilirubin, UA Negative Negative   Urobilinogen, Ur 0.2 0.2 - 1.0 mg/dL   Nitrite, UA  Negative Negative   Microscopic Examination See below:    Pertinent Imaging: CLINICAL DATA:  Follow-up right renal stone following recent lithotripsy  EXAM: ABDOMEN - 1 VIEW  COMPARISON:  05/30/2019  FINDINGS: Scattered large and small bowel gas is noted. Previously seen prominent lower pole right renal stone is again identified. The smaller fragment seen previously are not well appreciated.  IMPRESSION: Dominant lower pole renal stone appears stable.   Electronically Signed   By: Inez Catalina M.D.   On: 06/14/2019 16:23 I have independently reviewed the films with the patient and appreciate the lower pole right renal stone.  Assessment & Plan:    1. Right kidney stone We discussed that the stone is still present although I feel the stone is less dense.  I did explain that he likely has a global around the ESWL treatment, so we could not repeat the ESWL at this time.  I did offer URS/LL/right ureteral stent placement, but he deferred. I will obtain a renal ultrasound to evaluate for any iatrogenic  hydronephrosis at this time - DG Abd 1 View; Future He will return to the clinic is 3 months for repeat KUB and planing for definitive stone treatment Patient is advised that if they should start to experience pain that is not able to be controlled with pain medication, intractable nausea and/or vomiting and/or fevers greater than 103 or shaking chills to contact the office immediately or seek treatment in the emergency department for emergent intervention.     Return in about 3 months (around 09/12/2019) for KUB and office visit .  Zara Council, PA-C  [facility] [address], [place], [place] [address] [phone]

## 2019-06-20 ENCOUNTER — Other Ambulatory Visit: Payer: Self-pay | Admitting: Urology

## 2019-06-21 ENCOUNTER — Other Ambulatory Visit: Payer: Self-pay

## 2019-06-21 ENCOUNTER — Ambulatory Visit
Admission: RE | Admit: 2019-06-21 | Discharge: 2019-06-21 | Disposition: A | Discharge status: Home / Self Care | Payer: Medicare Other | Source: Ambulatory Visit | Attending: Urology | Admitting: Urology

## 2019-06-21 DIAGNOSIS — R310 Gross hematuria: Secondary | ICD-10-CM | POA: Insufficient documentation

## 2019-07-04 ENCOUNTER — Other Ambulatory Visit: Payer: Self-pay | Admitting: Physician Assistant

## 2019-08-02 ENCOUNTER — Other Ambulatory Visit: Payer: Self-pay | Admitting: Physician Assistant

## 2019-08-29 ENCOUNTER — Other Ambulatory Visit: Payer: Self-pay | Admitting: Physician Assistant

## 2019-09-18 NOTE — Progress Notes (Signed)
09/19/2019 9:21 AM   Robert Grant 19-Jun-1956 [id number]  CC: Postop ESWL  HPI: Robert Grant is a 64 y.o. male who presents for KUB and office visit.  He previously sought care in our clinic for gross hematuria and right flank pain.  Dr. Erlene Quan ordered hematuria work-up.  CT urogram revealed a 9 mm nonobstructing right renal stone.  Patient elected to proceed with ESWL for management of this on 05/17/2019.  He subsequently underwent stone management as described above. There were no complications and stone appeared smudged at the end of the procedure.  He was treated with a course of Cipro 500 mg twice daily for 5 days perioperatively for a low volume preoperative urine culture without infective symptoms.  In the interim, he reports passing 10-15 stone fragments with intermittent right flank pain and gross hematuria. He denies fevers, chills, and dysuria. He states overall his pain is improved from prior to the procedure.  He stopped Xarelto prior to ESWL and resumed it on 9/21. He has taken daily Flomax as prescribed.  Follow-up KUB on 05/30/2019 revealed persistence of the right renal stone with possible fragmentation present superiorly. RUS 05/2019 8 mm calculus lower pole right kidney. Study otherwise unremarkable.  He states shortly after having his appointment with me in October, he went on a camping trip and stumbled down some rocks.  He passed more fragments shortly after that incident.  He also had another incident of more fragments passing after working on a refrigerator in December.  He continues to have intermittent right-sided waist pain that is similar to his renal colic in the past.  Patient denies any modifying or aggravating factors.  Patient denies any gross hematuria, dysuria or suprapubic/flank pain.  Patient denies any fevers, chills, nausea or vomiting.   KUB 09/19/2019 significant reduction in stone size and density of the right lower stone.  He is currently taking  finasteride 5 mg daily.  He has no urinary complaints at this time.  His stone composition was calcium oxalate monohydrate.   PMH: Past Medical History:  Diagnosis Date  . A-fib ([place])   . AICD (automatic cardioverter/defibrillator) present 07/2015   AF, CHF  . CHF (congestive heart failure) ([place])   . Dysrhythmia    Afib  . Family history of adverse reaction to anesthesia   . Heart failure ([place])   . History of kidney stones   . Hyperlipemia   . Hypertension   . Sleep apnea    uses cpap    Surgical History: Past Surgical History:  Procedure Laterality Date  . APPENDECTOMY  2012  . COLECTOMY  07/2011  . EXTRACORPOREAL SHOCK WAVE LITHOTRIPSY Right 05/17/2019   Procedure: EXTRACORPOREAL SHOCK WAVE LITHOTRIPSY (ESWL);  Surgeon: Hollice Espy, MD;  Location: [facility] ORS;  Service: Urology;  Laterality: Right;  . Implant of defibrillator Left 08-22-15  . KNEE ARTHROSCOPY Left 10/2018   torn meniscus,  workers comp  . KNEE SURGERY Right 2006  . TONSILLECTOMY AND ADENOIDECTOMY  1960    Home Medications:  Allergies as of 09/19/2019      Reactions   Niacin Hives   Other reaction(s): Other (See Comments) Hot flashes   Spironolactone Other (See Comments)   gynecomastia      Medication List       Accurate as of September 19, 2019  9:21 AM. If you have any questions, ask your nurse or doctor.        atorvastatin 40 MG tablet Commonly known as: LIPITOR  TAKE 1 TABLET (40 MG TOTAL) BY MOUTH ONCE DAILY.   carvedilol 25 MG tablet Commonly known as: COREG Take 25 mg by mouth 2 (two) times daily.   eplerenone 25 MG tablet Commonly known as: INSPRA Take 25 mg by mouth daily.   finasteride 5 MG tablet Commonly known as: PROSCAR Take 5 mg by mouth daily.   lisinopril 10 MG tablet Commonly known as: ZESTRIL   rivaroxaban 20 MG Tabs tablet Commonly known as: XARELTO Take 20 mg by mouth daily with supper.   tamsulosin 0.4 MG Caps capsule Commonly known as: FLOMAX TAKE 1  CAPSULE BY MOUTH EVERY DAY   torsemide 20 MG tablet Commonly known as: DEMADEX TAKE 1 TABLETS BY MOUTH ONCE DAILY PRN weight gain >230      Allergies:  Allergies  Allergen Reactions  . Niacin Hives    Other reaction(s): Other (See Comments) Hot flashes  . Spironolactone Other (See Comments)    gynecomastia   Family History: Family History  Problem Relation Age of Onset  . Diabetes Mother   . Hypertension Mother   . Kidney disease Mother   . Hyperlipidemia Mother   . Lung cancer Father   . Prostate cancer Father   . Congestive Heart Failure Maternal Grandmother   . Congestive Heart Failure Paternal Grandmother    Social History:  reports that he quit smoking about 37 years ago. His smoking use included cigarettes. He has a 22.00 pack-year smoking history. He has never used smokeless tobacco. He reports that he does not drink alcohol or use drugs.  ROS: UROLOGY Frequent Urination?: No Hard to postpone urination?: No Burning/pain with urination?: No Get up at night to urinate?: No Leakage of urine?: No Urine stream starts and stops?: No Trouble starting stream?: No Do you have to strain to urinate?: No Blood in urine?: No Urinary tract infection?: No Sexually transmitted disease?: No Injury to kidneys or bladder?: No Painful intercourse?: No Weak stream?: No Erection problems?: No Penile pain?: No  Gastrointestinal Nausea?: No Vomiting?: No Indigestion/heartburn?: No Diarrhea?: No Constipation?: No  Constitutional Fever: No Night sweats?: No Weight loss?: No Fatigue?: No  Skin Skin rash/lesions?: No Itching?: No  Eyes Blurred vision?: No Double vision?: No  Ears/Nose/Throat Sore throat?: No Sinus problems?: No  Hematologic/Lymphatic Swollen glands?: No Easy bruising?: No  Cardiovascular Leg swelling?: No Chest pain?: No  Respiratory Cough?: No Shortness of breath?: No  Endocrine Excessive thirst?: No  Musculoskeletal Back pain?:  No Joint pain?: No  Neurological Headaches?: No Dizziness?: No  Psychologic Depression?: No Anxiety?: No  Physical Exam: BP (!) 170/98   Pulse 76   Ht 5\' 9"  (1.753 m)   Wt 230 lb (104.3 kg)   BMI 33.97 kg/m   Constitutional:  Well nourished. Alert and oriented, No acute distress. HEENT: [place] AT, mask in place.  Trachea midline, no masses. Cardiovascular: No clubbing, cyanosis, or edema. Respiratory: Normal respiratory effort, no increased work of breathing. GI: Abdomen is soft, non tender, non distended, no abdominal masses. Liver and spleen not palpable.  No hernias appreciated.  Stool sample for occult testing is not indicated.   GU: No CVA tenderness.  No bladder fullness or masses.  Patient with uncircumcised phallus. Foreskin easily retracted  Urethral meatus is patent.  No penile discharge. No penile lesions or rashes. Scrotum without lesions, cysts, rashes and/or edema.  Testicles are located scrotally bilaterally. No masses are appreciated in the testicles. Left and right epididymis are normal. Rectal: Patient with  normal sphincter tone. Anus and perineum without scarring or rashes. No rectal masses are appreciated. Prostate is approximately 50 grams, could only palpated the apex and the midportion of the gland, no nodules are appreciated. Seminal vesicles could not be palpated.   Skin: No rashes, bruises or suspicious lesions. Lymph: No inguinal adenopathy. Neurologic: Grossly intact, no focal deficits, moving all 4 extremities. Psychiatric: Normal mood and affect.   Laboratory Data: Results for orders placed or performed in visit on 05/30/19  Microscopic Examination   URINE  Result Value Ref Range   WBC, UA 0-5 0 - 5 /hpf   RBC 3-10 (A) 0 - 2 /hpf   Epithelial Cells (non renal) 0-10 0 - 10 /hpf   Bacteria, UA Few None seen/Few  Urinalysis, Complete  Result Value Ref Range   Specific Gravity, UA 1.020 1.005 - 1.030   pH, UA 5.0 5.0 - 7.5   Color, UA Yellow Yellow    Appearance Ur Clear Clear   Leukocytes,UA Negative Negative   Protein,UA Negative Negative/Trace   Glucose, UA 3+ (A) Negative   Ketones, UA Negative Negative   RBC, UA 1+ (A) Negative   Bilirubin, UA Negative Negative   Urobilinogen, Ur 0.2 0.2 - 1.0 mg/dL   Nitrite, UA Negative Negative   Microscopic Examination See below:    Pertinent Imaging: CLINICAL DATA:  64 year old male with nephrolithiasis  EXAM: ABDOMEN - 1 VIEW  COMPARISON:  06/14/2019  FINDINGS: Gas within stomach small bowel and colon.  No abnormal distention.  Small calcific densities projecting over the inferior aspect of the right renal silhouette, similar distribution to the prior. No calcifications in the region of the left renal silhouette.  No calcifications projecting over the anatomic pelvis.  Degenerative changes of the lumbar spine and the hips.  No acute displaced fracture.  No radiopaque foreign body.  IMPRESSION: Plain film evidence of right-sided nephrolithiasis, similar to the comparison plain film   Electronically Signed   By: Corrie Mckusick D.O.   On: 09/19/2019 08:49 I have independently reviewed the film images and note a small amount of right lower pole calcifications which is significantly decreased in density and size.  Assessment & Plan:    1. Right kidney stone Patient does not want to pursue any further procedure for the right lower stone as it is not warranted at this time We will continue to monitor He will return in 1 year for KUB He deferred on a 123456 metabolic work-up at this time We will obtain a renal ultrasound to ensure no silent hydronephrosis persists and I will call him with those results  2. BPH  Continue finasteride 5 mg daily PSA pending If PSA is stable return in 1 year for IPSS, PSA and exam   Return in about 1 year (around 09/18/2020) for KUB, PSA, I PSS and exam .  Zara Council, [facility]  [place] [address] [place], [place] [address] [phone]

## 2019-09-19 ENCOUNTER — Ambulatory Visit (INDEPENDENT_AMBULATORY_CARE_PROVIDER_SITE_OTHER): Payer: Medicare Other | Admitting: Urology

## 2019-09-19 ENCOUNTER — Encounter: Payer: Self-pay | Admitting: Urology

## 2019-09-19 ENCOUNTER — Other Ambulatory Visit: Payer: Self-pay

## 2019-09-19 ENCOUNTER — Ambulatory Visit
Admission: RE | Admit: 2019-09-19 | Discharge: 2019-09-19 | Disposition: A | Discharge status: Home / Self Care | Payer: Medicare Other | Source: Ambulatory Visit | Attending: Urology | Admitting: Urology

## 2019-09-19 ENCOUNTER — Other Ambulatory Visit: Payer: Self-pay | Admitting: Family Medicine

## 2019-09-19 VITALS — BP 170/98 | HR 76 | Ht 69.0 in | Wt 230.0 lb

## 2019-09-19 DIAGNOSIS — I868 Varicose veins of other specified sites: Secondary | ICD-10-CM

## 2019-09-19 DIAGNOSIS — N200 Calculus of kidney: Secondary | ICD-10-CM

## 2019-09-19 DIAGNOSIS — Z125 Encounter for screening for malignant neoplasm of prostate: Secondary | ICD-10-CM

## 2019-09-19 DIAGNOSIS — N401 Enlarged prostate with lower urinary tract symptoms: Secondary | ICD-10-CM

## 2019-09-20 LAB — PSA: Prostate Specific Ag, Serum: 0.3 ng/mL (ref 0.0–4.0)

## 2019-09-21 ENCOUNTER — Other Ambulatory Visit: Payer: Self-pay | Admitting: Physician Assistant

## 2019-10-03 ENCOUNTER — Ambulatory Visit
Admission: RE | Admit: 2019-10-03 | Discharge: 2019-10-03 | Disposition: A | Discharge status: Home / Self Care | Payer: Medicare Other | Source: Ambulatory Visit | Attending: Urology | Admitting: Urology

## 2019-10-03 ENCOUNTER — Other Ambulatory Visit: Payer: Self-pay

## 2019-10-03 DIAGNOSIS — N200 Calculus of kidney: Secondary | ICD-10-CM | POA: Diagnosis present

## 2019-11-22 ENCOUNTER — Telehealth: Payer: Self-pay

## 2019-11-22 NOTE — Telephone Encounter (Signed)
Opened in error

## 2019-11-26 ENCOUNTER — Ambulatory Visit (INDEPENDENT_AMBULATORY_CARE_PROVIDER_SITE_OTHER): Payer: Medicare Other | Admitting: Family Medicine

## 2019-11-26 ENCOUNTER — Other Ambulatory Visit: Payer: Self-pay

## 2019-11-26 ENCOUNTER — Encounter: Payer: Self-pay | Admitting: Family Medicine

## 2019-11-26 VITALS — BP 140/82 | HR 70 | Temp 97.1°F | Wt 231.0 lb

## 2019-11-26 DIAGNOSIS — I10 Essential (primary) hypertension: Secondary | ICD-10-CM | POA: Diagnosis not present

## 2019-11-26 DIAGNOSIS — R739 Hyperglycemia, unspecified: Secondary | ICD-10-CM | POA: Diagnosis not present

## 2019-11-26 DIAGNOSIS — Z6834 Body mass index (BMI) 34.0-34.9, adult: Secondary | ICD-10-CM | POA: Diagnosis not present

## 2019-11-26 DIAGNOSIS — I5042 Chronic combined systolic (congestive) and diastolic (congestive) heart failure: Secondary | ICD-10-CM | POA: Diagnosis not present

## 2019-11-26 LAB — POCT GLYCOSYLATED HEMOGLOBIN (HGB A1C): Hemoglobin A1C: 8.8 % — AB (ref 4.0–5.6)

## 2019-11-26 MED ORDER — GLUCOSE BLOOD VI STRP: ORAL_STRIP | 12 refills | Status: DC

## 2019-11-26 MED ORDER — ONETOUCH ULTRASOFT LANCETS MISC: 12 refills | Status: DC

## 2019-11-26 NOTE — Progress Notes (Signed)
Robert Grant  MRN: [id number] DOB: Jan 12, 1956  Subjective:  HPI   The patient is  a 64 year old male who presents today for evaluation of possible diabetes.  He has been getting high readings through his cardiologists since 12/2018.   The patient reports he has been treated for diabetes in the past with oral medication but was able to get it under control well enough to come off the medicine.   He has not been checking his glucose at home and does note that on all the readings in the past he was Not fasting.  He reports he is fasting today.  Past Medical History:  Diagnosis Date  . A-fib ([place])   . AICD (automatic cardioverter/defibrillator) present 07/2015   AF, CHF  . CHF (congestive heart failure) ([place])   . Dysrhythmia    Afib  . Family history of adverse reaction to anesthesia   . Heart failure ([place])   . History of kidney stones   . Hyperlipemia   . Hypertension   . Sleep apnea    uses cpap   Patient Active Problem List   Diagnosis Date Noted  . Pseudophakia of right eye 02/23/2017  . Hematuria, microscopic 06/08/2016  . Obstructive sleep apnea 01/21/2016  . Automatic implantable cardioverter-defibrillator in situ 12/23/2015  . Heart failure ([place]) 04/28/2015  . Chronic combined systolic and diastolic heart failure ([place]) 04/28/2015  . Cardiomyopathy ([place]) 04/22/2015  . Acute CHF ([place]) 04/12/2015  . A-fib ([place]) 04/12/2015  . Beat, premature ventricular 03/31/2015  . Paroxysmal atrial fibrillation ([place]) 03/28/2015  . Compulsive tobacco user syndrome 02/14/2015  . Chest pain 02/14/2015  . Annual physical exam 02/14/2015  . Closed fracture of nasal bones 02/06/2015  . Dizziness 02/06/2015  . Hypertriglyceridemia 02/06/2015  . Basal cell papilloma 02/06/2015  . Feeling stressed out 02/06/2015  . Snores 02/06/2015  . H/O cardiac catheterization 05/16/2014  . Awareness of heartbeats 04/19/2014  . Breath shortness 04/02/2014  . Decreased potassium in the blood  10/16/2007  . Essential (primary) hypertension 11/28/2006  . HLD (hyperlipidemia) 11/28/2006   Past Surgical History:  Procedure Laterality Date  . APPENDECTOMY  2012  . COLECTOMY  07/2011  . EXTRACORPOREAL SHOCK WAVE LITHOTRIPSY Right 05/17/2019   Procedure: EXTRACORPOREAL SHOCK WAVE LITHOTRIPSY (ESWL);  Surgeon: Hollice Espy, MD;  Location: [facility] ORS;  Service: Urology;  Laterality: Right;  . Implant of defibrillator Left 08-22-15  . KNEE ARTHROSCOPY Left 10/2018   torn meniscus,  workers comp  . KNEE SURGERY Right 2006  . TONSILLECTOMY AND ADENOIDECTOMY  1960   Family History  Problem Relation Age of Onset  . Diabetes Mother   . Hypertension Mother   . Kidney disease Mother   . Hyperlipidemia Mother   . Lung cancer Father   . Prostate cancer Father   . Congestive Heart Failure Maternal Grandmother   . Congestive Heart Failure Paternal Grandmother     Social History   Socioeconomic History  . Marital status: Married    Spouse name: Horris Latino  . Number of children: Not on file  . Years of education: Not on file  . Highest education level: Not on file  Occupational History  . Occupation: out on workers comp d/t knee injury  Tobacco Use  . Smoking status: Former Smoker    Packs/day: 2.00    Years: 11.00    Pack years: 22.00    Types: Cigarettes    Quit date: 08/1982    Years since quitting: 37.2  .  Smokeless tobacco: Never Used  Substance and Sexual Activity  . Alcohol use: No    Alcohol/week: 0.0 standard drinks  . Drug use: No  . Sexual activity: Yes    Birth control/protection: None  Other Topics Concern  . Not on file  Social History Narrative  . Not on file   Social Determinants of Health   Financial Resource Strain:   . Difficulty of Paying Living Expenses:   Food Insecurity:   . Worried About Charity fundraiser in the Last Year:   . Arboriculturist in the Last Year:   Transportation Needs:   . Film/video editor (Medical):   Marland Kitchen Lack of  Transportation (Non-Medical):   Physical Activity:   . Days of Exercise per Week:   . Minutes of Exercise per Session:   Stress:   . Feeling of Stress :   Social Connections:   . Frequency of Communication with Friends and Family:   . Frequency of Social Gatherings with Friends and Family:   . Attends Religious Services:   . Active Member of Clubs or Organizations:   . Attends Archivist Meetings:   Marland Kitchen Marital Status:   Intimate Partner Violence:   . Fear of Current or Ex-Partner:   . Emotionally Abused:   Marland Kitchen Physically Abused:   . Sexually Abused:     Outpatient Encounter Medications as of 11/26/2019  Medication Sig  . atorvastatin (LIPITOR) 40 MG tablet TAKE 1 TABLET (40 MG TOTAL) BY MOUTH ONCE DAILY.  . carvedilol (COREG) 25 MG tablet Take 25 mg by mouth 2 (two) times daily.  Marland Kitchen eplerenone (INSPRA) 25 MG tablet Take 25 mg by mouth 2 (two) times daily.   . finasteride (PROSCAR) 5 MG tablet Take 5 mg by mouth daily.  Marland Kitchen lisinopril (ZESTRIL) 10 MG tablet   . rivaroxaban (XARELTO) 20 MG TABS tablet Take 20 mg by mouth daily with supper.  . tamsulosin (FLOMAX) 0.4 MG CAPS capsule TAKE 1 CAPSULE BY MOUTH EVERY DAY  . torsemide (DEMADEX) 20 MG tablet TAKE 1 TABLETS BY MOUTH ONCE DAILY PRN weight gain >230   No facility-administered encounter medications on file as of 11/26/2019.    Allergies  Allergen Reactions  . Niacin Hives    Other reaction(s): Other (See Comments) Hot flashes  . Spironolactone Other (See Comments)    gynecomastia    Review of Systems  Constitutional: Negative for diaphoresis, fever and malaise/fatigue.  HENT: Negative for congestion, ear pain, sinus pain and sore throat.   Respiratory: Negative for cough, shortness of breath and wheezing.   Cardiovascular: Negative for chest pain, palpitations and leg swelling.  Gastrointestinal: Negative for abdominal pain and diarrhea.  Genitourinary: Negative for frequency.  Musculoskeletal: Negative for  myalgias.  Endo/Heme/Allergies: Negative for polydipsia.    Objective:  BP 140/82 (BP Location: Right Arm, Patient Position: Sitting, Cuff Size: Normal)   Pulse 70   Temp (!) 97.1 F (36.2 C) (Skin)   Wt 231 lb (104.8 kg)   SpO2 97%   BMI 34.11 kg/m   Physical Exam  Constitutional: He is oriented to person, place, and time and well-developed, well-nourished, and in no distress.  HENT:  Head: Normocephalic.  Eyes: Conjunctivae are normal.  Cardiovascular: Normal rate and regular rhythm.  Pulmonary/Chest: Effort normal and breath sounds normal.  Abdominal: Soft. Bowel sounds are normal.  Musculoskeletal:        General: Normal range of motion.     Cervical back: Neck supple.  Neurological: He is alert and oriented to person, place, and time.  Skin: No rash noted.  Psychiatric: Mood, affect and judgment normal.    Assessment and Plan :   1. Hyperglycemia BS over 200 when checked by cardiologist on 11-20-19. With his history of CHF, will add Farxiga 10 mg qd and check FBS daily. Hgb A1C 8.8 today. Recheck in 2 weeks. - POCT glycosylated hemoglobin (Hb A1C) - glucose blood test strip; Test fasting blood sugar each morning and as needed for low sugar symptoms.  Dispense: 100 each; Refill: 12 - Lancets (ONETOUCH ULTRASOFT) lancets; Test fasting blood sugar each morning and as needed for low sugar symptoms.  Dispense: 100 each; Refill: 12  2. Chronic combined systolic and diastolic heart failure (HCC) Presently on Torsemide 40 mg qd and Eplerenone 50 mg qd until weight down to 223. Renal function tests normal on 11-20-19. Maintain follow up with cardiologist.  3. Essential (primary) hypertension Improvement in BP with use of Carvedilol, Lisinopril and Torsemide. Followed by cardiologist (Dr. Tammi Klippel).  4. Body mass index (BMI) of 34.0-34.9 in adult BMI 34.11 today. Trying to restrict weight gain due to CHF. Will give low fat and reduced calorie diet. Continue follow up with  cardiologist.

## 2019-11-26 NOTE — Patient Instructions (Signed)
Fat and Cholesterol Restricted Eating Plan Getting too much fat and cholesterol in your diet may cause health problems. Choosing the right foods helps keep your fat and cholesterol at normal levels. This can keep you from getting certain diseases. Your doctor may recommend an eating plan that includes:  Total fat: ______% or less of total calories a day.  Saturated fat: ______% or less of total calories a day.  Cholesterol: less than _________mg a day.  Fiber: ______g a day. What are tips for following this plan? Meal planning  At meals, divide your plate into four equal parts: ? Fill one-half of your plate with vegetables and green salads. ? Fill one-fourth of your plate with whole grains. ? Fill one-fourth of your plate with low-fat (lean) protein foods.  Eat fish that is high in omega-3 fats at least two times a week. This includes mackerel, tuna, sardines, and salmon.  Eat foods that are high in fiber, such as whole grains, beans, apples, broccoli, carrots, peas, and barley. General tips   Work with your doctor to lose weight if you need to.  Avoid: ? Foods with added sugar. ? Fried foods. ? Foods with partially hydrogenated oils.  Limit alcohol intake to no more than 1 drink a day for nonpregnant women and 2 drinks a day for men. One drink equals 12 oz of beer, 5 oz of wine, or 1 oz of hard liquor. Reading food labels  Check food labels for: ? Trans fats. ? Partially hydrogenated oils. ? Saturated fat (g) in each serving. ? Cholesterol (mg) in each serving. ? Fiber (g) in each serving.  Choose foods with healthy fats, such as: ? Monounsaturated fats. ? Polyunsaturated fats. ? Omega-3 fats.  Choose grain products that have whole grains. Look for the word "whole" as the first word in the ingredient list. Cooking  Cook foods using low-fat methods. These include baking, boiling, grilling, and broiling.  Eat more home-cooked foods. Eat at restaurants and buffets  less often.  Avoid cooking using saturated fats, such as butter, cream, palm oil, palm kernel oil, and coconut oil. Recommended foods  Fruits  All fresh, canned (in natural juice), or frozen fruits. Vegetables  Fresh or frozen vegetables (raw, steamed, roasted, or grilled). Green salads. Grains  Whole grains, such as whole wheat or whole grain breads, crackers, cereals, and pasta. Unsweetened oatmeal, bulgur, barley, quinoa, or brown rice. Corn or whole wheat flour tortillas. Meats and other protein foods  Ground beef (85% or leaner), grass-fed beef, or beef trimmed of fat. Skinless chicken or turkey. Ground chicken or turkey. Pork trimmed of fat. All fish and seafood. Egg whites. Dried beans, peas, or lentils. Unsalted nuts or seeds. Unsalted canned beans. Nut butters without added sugar or oil. Dairy  Low-fat or nonfat dairy products, such as skim or 1% milk, 2% or reduced-fat cheeses, low-fat and fat-free ricotta or cottage cheese, or plain low-fat and nonfat yogurt. Fats and oils  Tub margarine without trans fats. Light or reduced-fat mayonnaise and salad dressings. Avocado. Olive, canola, sesame, or safflower oils. The items listed above may not be a complete list of foods and beverages you can eat. Contact a dietitian for more information. Foods to avoid Fruits  Canned fruit in heavy syrup. Fruit in cream or butter sauce. Fried fruit. Vegetables  Vegetables cooked in cheese, cream, or butter sauce. Fried vegetables. Grains  White bread. White pasta. White rice. Cornbread. Bagels, pastries, and croissants. Crackers and snack foods that contain trans fat   and hydrogenated oils. Meats and other protein foods  Fatty cuts of meat. Ribs, chicken wings, bacon, sausage, bologna, salami, chitterlings, fatback, hot dogs, bratwurst, and packaged lunch meats. Liver and organ meats. Whole eggs and egg yolks. Chicken and turkey with skin. Fried meat. Dairy  Whole or 2% milk, cream,  half-and-half, and cream cheese. Whole milk cheeses. Whole-fat or sweetened yogurt. Full-fat cheeses. Nondairy creamers and whipped toppings. Processed cheese, cheese spreads, and cheese curds. Beverages  Alcohol. Sugar-sweetened drinks such as sodas, lemonade, and fruit drinks. Fats and oils  Butter, stick margarine, lard, shortening, ghee, or bacon fat. Coconut, palm kernel, and palm oils. Sweets and desserts  Corn syrup, sugars, honey, and molasses. Candy. Jam and jelly. Syrup. Sweetened cereals. Cookies, pies, cakes, donuts, muffins, and ice cream. The items listed above may not be a complete list of foods and beverages you should avoid. Contact a dietitian for more information. Summary  Choosing the right foods helps keep your fat and cholesterol at normal levels. This can keep you from getting certain diseases.  At meals, fill one-half of your plate with vegetables and green salads.  Eat high-fiber foods, like whole grains, beans, apples, carrots, peas, and barley.  Limit added sugar, saturated fats, alcohol, and fried foods. This information is not intended to replace advice given to you by your health care provider. Make sure you discuss any questions you have with your health care provider. Document Revised: 04/19/2018 Document Reviewed: 05/03/2017 Elsevier Patient Education  2020 [employer]  

## 2019-12-10 ENCOUNTER — Encounter: Payer: Self-pay | Admitting: Family Medicine

## 2019-12-10 ENCOUNTER — Ambulatory Visit (INDEPENDENT_AMBULATORY_CARE_PROVIDER_SITE_OTHER): Payer: Medicare Other | Admitting: Family Medicine

## 2019-12-10 ENCOUNTER — Other Ambulatory Visit: Payer: Self-pay

## 2019-12-10 VITALS — BP 140/88 | HR 68 | Temp 96.8°F | Wt 227.0 lb

## 2019-12-10 DIAGNOSIS — I5042 Chronic combined systolic (congestive) and diastolic (congestive) heart failure: Secondary | ICD-10-CM | POA: Diagnosis not present

## 2019-12-10 DIAGNOSIS — Z95810 Presence of automatic (implantable) cardiac defibrillator: Secondary | ICD-10-CM

## 2019-12-10 DIAGNOSIS — I10 Essential (primary) hypertension: Secondary | ICD-10-CM

## 2019-12-10 DIAGNOSIS — E118 Type 2 diabetes mellitus with unspecified complications: Secondary | ICD-10-CM

## 2019-12-10 DIAGNOSIS — E781 Pure hyperglyceridemia: Secondary | ICD-10-CM

## 2019-12-10 MED ORDER — FARXIGA 10 MG PO TABS: 10.0000 mg | ORAL_TABLET | Freq: Every day | ORAL | 3 refills | Status: DC

## 2019-12-10 NOTE — Progress Notes (Signed)
Established patient visit      Patient: Robert Grant   DOB: 14-Feb-1956   64 y.o. Male  MRN: [id number] Visit Date: 12/10/2019  Today's healthcare provider: Vernie Murders, PA  Subjective:    Chief Complaint  Patient presents with  . Diabetes   HPI   Diabetes Mellitus Type II, Follow-up:   Lab Results  Component Value Date   HGBA1C 8.8 (A) 11/26/2019   HGBA1C 6.9 (H) 06/09/2016    Last seen for diabetes 2 weeks ago.  Management since then includes adding Faxiga. He reports good compliance with treatment. He is not having side effects.  Home blood sugar records: 103-160's Episodes of hypoglycemia? no Current insulin regiment: Is not on insulin  Pertinent Labs:    Component Value Date/Time   CHOL 138 11/15/2016 1116   TRIG 405 (H) 11/15/2016 1116   HDL 26 (L) 11/15/2016 1116   LDLCALC Comment 11/15/2016 1116   CREATININE 1.11 04/13/2019 1017    Wt Readings from Last 3 Encounters:  12/10/19 227 lb (103 kg)  11/26/19 231 lb (104.8 kg)  09/19/19 230 lb (104.3 kg)    ------------------------------------------------------------------------  Past Medical History:  Diagnosis Date  . A-fib ([place])   . AICD (automatic cardioverter/defibrillator) present 07/2015   AF, CHF  . CHF (congestive heart failure) ([place])   . Dysrhythmia    Afib  . Family history of adverse reaction to anesthesia   . Heart failure ([place])   . History of kidney stones   . Hyperlipemia   . Hypertension   . Sleep apnea    uses cpap   Past Surgical History:  Procedure Laterality Date  . APPENDECTOMY  2012  . COLECTOMY  07/2011  . EXTRACORPOREAL SHOCK WAVE LITHOTRIPSY Right 05/17/2019   Procedure: EXTRACORPOREAL SHOCK WAVE LITHOTRIPSY (ESWL);  Surgeon: Hollice Espy, MD;  Location: [facility] ORS;  Service: Urology;  Laterality: Right;  . Implant of defibrillator Left 08-22-15  . KNEE ARTHROSCOPY Left 10/2018   torn meniscus,  workers comp  . KNEE SURGERY Right 2006  . TONSILLECTOMY AND  ADENOIDECTOMY  1960   Social History   Socioeconomic History  . Marital status: Married    Spouse name: Horris Latino  . Number of children: Not on file  . Years of education: Not on file  . Highest education level: Not on file  Occupational History  . Occupation: out on workers comp d/t knee injury  Tobacco Use  . Smoking status: Former Smoker    Packs/day: 2.00    Years: 11.00    Pack years: 22.00    Types: Cigarettes    Quit date: 08/1982    Years since quitting: 37.3  . Smokeless tobacco: Never Used  Substance and Sexual Activity  . Alcohol use: No    Alcohol/week: 0.0 standard drinks  . Drug use: No  . Sexual activity: Yes    Birth control/protection: None  Other Topics Concern  . Not on file  Social History Narrative  . Not on file   Social Determinants of Health   Financial Resource Strain:   . Difficulty of Paying Living Expenses:   Food Insecurity:   . Worried About Charity fundraiser in the Last Year:   . Arboriculturist in the Last Year:   Transportation Needs:   . Film/video editor (Medical):   Marland Kitchen Lack of Transportation (Non-Medical):   Physical Activity:   . Days of Exercise per Week:   . Minutes of Exercise per  Session:   Stress:   . Feeling of Stress :   Social Connections:   . Frequency of Communication with Friends and Family:   . Frequency of Social Gatherings with Friends and Family:   . Attends Religious Services:   . Active Member of Clubs or Organizations:   . Attends Archivist Meetings:   Marland Kitchen Marital Status:   Intimate Partner Violence:   . Fear of Current or Ex-Partner:   . Emotionally Abused:   Marland Kitchen Physically Abused:   . Sexually Abused:    Family History  Problem Relation Age of Onset  . Diabetes Mother   . Hypertension Mother   . Kidney disease Mother   . Hyperlipidemia Mother   . Lung cancer Father   . Prostate cancer Father   . Congestive Heart Failure Maternal Grandmother   . Congestive Heart Failure Paternal  Grandmother    Allergies  Allergen Reactions  . Niacin Hives    Other reaction(s): Other (See Comments) Hot flashes  . Spironolactone Other (See Comments)    gynecomastia       Medications: Outpatient Medications Prior to Visit  Medication Sig  . atorvastatin (LIPITOR) 40 MG tablet TAKE 1 TABLET (40 MG TOTAL) BY MOUTH ONCE DAILY.  . carvedilol (COREG) 25 MG tablet Take 25 mg by mouth 2 (two) times daily.  . dapagliflozin propanediol (FARXIGA) 10 MG TABS tablet Take 10 mg by mouth daily.  Marland Kitchen eplerenone (INSPRA) 25 MG tablet Take 25 mg by mouth 2 (two) times daily.   . finasteride (PROSCAR) 5 MG tablet Take 5 mg by mouth daily.  Marland Kitchen glucose blood test strip Test fasting blood sugar each morning and as needed for low sugar symptoms.  . Lancets (ONETOUCH ULTRASOFT) lancets Test fasting blood sugar each morning and as needed for low sugar symptoms.  Marland Kitchen lisinopril (ZESTRIL) 10 MG tablet   . rivaroxaban (XARELTO) 20 MG TABS tablet Take 20 mg by mouth daily with supper.  . tamsulosin (FLOMAX) 0.4 MG CAPS capsule TAKE 1 CAPSULE BY MOUTH EVERY DAY  . torsemide (DEMADEX) 20 MG tablet TAKE 1 TABLETS BY MOUTH ONCE DAILY PRN weight gain >230   No facility-administered medications prior to visit.    Review of Systems  Constitutional: Negative for chills, diaphoresis, fatigue and fever.  HENT: Negative for congestion, ear pain and sore throat.   Respiratory: Negative for cough and shortness of breath.   Cardiovascular: Negative for chest pain and leg swelling.  Gastrointestinal: Negative for abdominal pain and diarrhea.  Endocrine: Negative for polydipsia.  Genitourinary: Negative for frequency.  Neurological: Negative for headaches.        Objective:    BP 140/88 (BP Location: Right Arm, Patient Position: Sitting, Cuff Size: Normal)   Pulse 68   Temp (!) 96.8 F (36 C) (Skin)   Wt 227 lb (103 kg)   SpO2 98%   BMI 33.52 kg/m    Physical Exam Constitutional:      General: He is  not in acute distress.    Appearance: He is well-developed.  HENT:     Head: Normocephalic and atraumatic.     Right Ear: Hearing normal.     Left Ear: Hearing normal.     Nose: Nose normal.  Eyes:     General: Lids are normal. No scleral icterus.       Right eye: No discharge.        Left eye: No discharge.     Conjunctiva/sclera: Conjunctivae normal.  Cardiovascular:     Rate and Rhythm: Normal rate and regular rhythm.     Heart sounds: Normal heart sounds.  Pulmonary:     Effort: Pulmonary effort is normal. No respiratory distress.     Breath sounds: Normal breath sounds.  Abdominal:     General: Bowel sounds are normal.     Palpations: Abdomen is soft.  Musculoskeletal:        General: Normal range of motion.     Cervical back: Normal range of motion.  Skin:    Findings: No lesion or rash.  Neurological:     Mental Status: He is alert and oriented to person, place, and time.  Psychiatric:        Speech: Speech normal.        Behavior: Behavior normal.        Thought Content: Thought content normal.        Assessment & Plan:    1. Controlled type 2 diabetes mellitus with complication, without long-term current use of insulin (HCC) Tolerating the Farxiga 10 mg qd without side effects. FBS at home has been in the 103-160 range and no hypoglycemic episodes. Continues to lower fats and carbohydrates in diet. Hgb A1C was 8.8 on 11-26-19. Continue present regimen and follow up lipids. - dapagliflozin propanediol (FARXIGA) 10 MG TABS tablet; Take 10 mg by mouth daily.  Dispense: 90 tablet; Refill: 3  2. Essential (primary) hypertension Improved on the Carvedilol, Lisinopril and Torsemide. Followed by Dr. Tammi Klippel (cardiologist).  3. Chronic combined systolic and diastolic heart failure (HCC) Still on Torsemide and Eplerenone and feels weight is down a little more. No significant edema or palpitations.  4. Automatic implantable cardioverter-defibrillator in situ States  cardiologist interrogated the cardioverter and no evidence of discharge recently. Continue follow up with cardiologist.   5. Hypertriglyceridemia Triglycerides in September 2020 was over 1500. Changed diet and lost some weight. Will recheck lipid panel and follow up pending reports. - Lipid panel      Vernie Murders, [place] [phone] (phone) [phone] (fax)  [place]

## 2019-12-11 ENCOUNTER — Telehealth: Payer: Self-pay

## 2019-12-11 LAB — LIPID PANEL
Chol/HDL Ratio: 4.7 ratio (ref 0.0–5.0)
Cholesterol, Total: 132 mg/dL (ref 100–199)
HDL: 28 mg/dL — ABNORMAL LOW (ref >39)
LDL Chol Calc (NIH): 54 mg/dL (ref 0–99)
Triglycerides: 324 mg/dL — ABNORMAL HIGH (ref 0–149)
VLDL Cholesterol Cal: 50 mg/dL — ABNORMAL HIGH (ref 5–40)

## 2019-12-11 NOTE — Telephone Encounter (Signed)
LMTCB, P[facility] Triage Nurse may give patient results  

## 2019-12-11 NOTE — Telephone Encounter (Signed)
-----   Message from Margo Common, [place] sent at 12/11/2019  8:11 AM EDT ----- Triglycerides improving but HDL still low. Still need Atorvastatin 40 mg qd and recheck progress and diabetes in 3 months.

## 2019-12-11 NOTE — Telephone Encounter (Signed)
Pt. Called back.Given message and instructions. Will call back to schedule an appointment for follow up.

## 2020-05-26 ENCOUNTER — Telehealth: Payer: Self-pay | Admitting: Family Medicine

## 2020-05-26 NOTE — Telephone Encounter (Signed)
[facility] faxed refill request for the following medications:  Requesting a 90 day supply - dapagliflozin propanediol (FARXIGA) 10 MG TABS tablet   Please advise. Thanks, [employer]

## 2020-05-27 ENCOUNTER — Other Ambulatory Visit: Payer: Self-pay

## 2020-05-27 DIAGNOSIS — E118 Type 2 diabetes mellitus with unspecified complications: Secondary | ICD-10-CM

## 2020-05-27 MED ORDER — DAPAGLIFLOZIN PROPANEDIOL 10 MG PO TABS: 10.0000 mg | ORAL_TABLET | Freq: Every day | ORAL | 0 refills | Status: DC

## 2020-09-11 ENCOUNTER — Other Ambulatory Visit: Payer: Self-pay

## 2020-09-11 ENCOUNTER — Other Ambulatory Visit: Payer: Medicare Other

## 2020-09-11 ENCOUNTER — Other Ambulatory Visit: Payer: Self-pay | Admitting: Family Medicine

## 2020-09-11 DIAGNOSIS — I868 Varicose veins of other specified sites: Secondary | ICD-10-CM

## 2020-09-11 DIAGNOSIS — N200 Calculus of kidney: Secondary | ICD-10-CM

## 2020-09-11 DIAGNOSIS — Z125 Encounter for screening for malignant neoplasm of prostate: Secondary | ICD-10-CM

## 2020-09-12 LAB — PSA: Prostate Specific Ag, Serum: 0.8 ng/mL (ref 0.0–4.0)

## 2020-09-17 NOTE — Progress Notes (Deleted)
09/18/2020 1:26 PM   Robert Grant 1956-05-03 [id number]  CC: Postop ESWL  Urological history 1. Nephrolithiasis - remote history of stones in the 1980's - CTU 03/2019 a 9 mm nonobstructing right renal stone - ESWL 05/2019 - Stone composition calcium oxalate monohydrate - RUS 10/2019 5 mm right lower pole renal stone. No obstructive changes are noted - KUB   2. High risk hematuria - Former smoker - CTU 03/2019 Nonobstructing RIGHT renal calculus. No ureterolithiasis or obstructive uropathy.  No enhancing renal lesion.  No bladder stones or filling defects in the bladder which does not excluded a bladder lesion - Cysto 03/2019 enlarged prostate with friable prostatic mucosa, bleeding with endoscopic manipulation    HPI: Robert Grant is a 65 y.o. male who presents for KUB and office visit.  He previously sought care in our clinic for gross hematuria and right flank pain.  Dr. Erlene Quan ordered hematuria work-up.  CT urogram revealed a 9 mm nonobstructing right renal stone.  Patient elected to proceed with ESWL for management of this on 05/17/2019.  He subsequently underwent stone management as described above. There were no complications and stone appeared smudged at the end of the procedure.  He was treated with a course of Cipro 500 mg twice daily for 5 days perioperatively for a low volume preoperative urine culture without infective symptoms.  In the interim, he reports passing 10-15 stone fragments with intermittent right flank pain and gross hematuria. He denies fevers, chills, and dysuria. He states overall his pain is improved from prior to the procedure.  He stopped Xarelto prior to ESWL and resumed it on 9/21. He has taken daily Flomax as prescribed.  Follow-up KUB on 05/30/2019 revealed persistence of the right renal stone with possible fragmentation present superiorly. RUS 05/2019 8 mm calculus lower pole right kidney. Study otherwise unremarkable.  He states shortly  after having his appointment with me in October, he went on a camping trip and stumbled down some rocks.  He passed more fragments shortly after that incident.  He also had another incident of more fragments passing after working on a refrigerator in December.  He continues to have intermittent right-sided waist pain that is similar to his renal colic in the past.  Patient denies any modifying or aggravating factors.  Patient denies any gross hematuria, dysuria or suprapubic/flank pain.  Patient denies any fevers, chills, nausea or vomiting.   KUB 09/19/2019 significant reduction in stone size and density of the right lower stone.  He is currently taking finasteride 5 mg daily.  He has no urinary complaints at this time.    PMH: Past Medical History:  Diagnosis Date  . A-fib ([place])   . AICD (automatic cardioverter/defibrillator) present 07/2015   AF, CHF  . CHF (congestive heart failure) ([place])   . Dysrhythmia    Afib  . Family history of adverse reaction to anesthesia   . Heart failure ([place])   . History of kidney stones   . Hyperlipemia   . Hypertension   . Sleep apnea    uses cpap    Surgical History: Past Surgical History:  Procedure Laterality Date  . APPENDECTOMY  2012  . COLECTOMY  07/2011  . EXTRACORPOREAL SHOCK WAVE LITHOTRIPSY Right 05/17/2019   Procedure: EXTRACORPOREAL SHOCK WAVE LITHOTRIPSY (ESWL);  Surgeon: Hollice Espy, MD;  Location: [facility] ORS;  Service: Urology;  Laterality: Right;  . Implant of defibrillator Left 08-22-15  . KNEE ARTHROSCOPY Left 10/2018   torn meniscus,  workers comp  . KNEE SURGERY Right 2006  . TONSILLECTOMY AND ADENOIDECTOMY  1960    Home Medications:  Allergies as of 09/18/2020      Reactions   Niacin Hives   Other reaction(s): Other (See Comments) Hot flashes   Spironolactone Other (See Comments)   gynecomastia      Medication List       Accurate as of September 17, 2020  1:26 PM. If you have any questions, ask your nurse or  doctor.        atorvastatin 40 MG tablet Commonly known as: LIPITOR TAKE 1 TABLET (40 MG TOTAL) BY MOUTH ONCE DAILY.   carvedilol 25 MG tablet Commonly known as: COREG Take 25 mg by mouth 2 (two) times daily.   dapagliflozin propanediol 10 MG Tabs tablet Commonly known as: Farxiga Take 1 tablet (10 mg total) by mouth daily.   eplerenone 25 MG tablet Commonly known as: INSPRA Take 25 mg by mouth 2 (two) times daily.   finasteride 5 MG tablet Commonly known as: PROSCAR Take 5 mg by mouth daily.   glucose blood test strip Test fasting blood sugar each morning and as needed for low sugar symptoms.   lisinopril 10 MG tablet Commonly known as: ZESTRIL   onetouch ultrasoft lancets Test fasting blood sugar each morning and as needed for low sugar symptoms.   rivaroxaban 20 MG Tabs tablet Commonly known as: XARELTO Take 20 mg by mouth daily with supper.   tamsulosin 0.4 MG Caps capsule Commonly known as: FLOMAX TAKE 1 CAPSULE BY MOUTH EVERY DAY   torsemide 20 MG tablet Commonly known as: DEMADEX TAKE 1 TABLETS BY MOUTH ONCE DAILY PRN weight gain >230      Allergies:  Allergies  Allergen Reactions  . Niacin Hives    Other reaction(s): Other (See Comments) Hot flashes  . Spironolactone Other (See Comments)    gynecomastia   Family History: Family History  Problem Relation Age of Onset  . Diabetes Mother   . Hypertension Mother   . Kidney disease Mother   . Hyperlipidemia Mother   . Lung cancer Father   . Prostate cancer Father   . Congestive Heart Failure Maternal Grandmother   . Congestive Heart Failure Paternal Grandmother    Social History:  reports that he quit smoking about 38 years ago. His smoking use included cigarettes. He has a 22.00 pack-year smoking history. He has never used smokeless tobacco. He reports that he does not drink alcohol and does not use drugs.  ROS:                                        Physical  Exam: There were no vitals taken for this visit.  Constitutional:  Well nourished. Alert and oriented, No acute distress. HEENT: [place] AT, mask in place.  Trachea midline, no masses. Cardiovascular: No clubbing, cyanosis, or edema. Respiratory: Normal respiratory effort, no increased work of breathing. GI: Abdomen is soft, non tender, non distended, no abdominal masses. Liver and spleen not palpable.  No hernias appreciated.  Stool sample for occult testing is not indicated.   GU: No CVA tenderness.  No bladder fullness or masses.  Patient with uncircumcised phallus. Foreskin easily retracted  Urethral meatus is patent.  No penile discharge. No penile lesions or rashes. Scrotum without lesions, cysts, rashes and/or edema.  Testicles are located scrotally bilaterally. No masses are  appreciated in the testicles. Left and right epididymis are normal. Rectal: Patient with  normal sphincter tone. Anus and perineum without scarring or rashes. No rectal masses are appreciated. Prostate is approximately 50 grams, could only palpated the apex and the midportion of the gland, no nodules are appreciated. Seminal vesicles could not be palpated.   Skin: No rashes, bruises or suspicious lesions. Lymph: No inguinal adenopathy. Neurologic: Grossly intact, no focal deficits, moving all 4 extremities. Psychiatric: Normal mood and affect.   Laboratory Data: Results for orders placed or performed in visit on 09/11/20  PSA  Result Value Ref Range   Prostate Specific Ag, Serum 0.8 0.0 - 4.0 ng/mL   Pertinent Imaging: CLINICAL DATA:  65 year old male with nephrolithiasis  EXAM: ABDOMEN - 1 VIEW  COMPARISON:  06/14/2019  FINDINGS: Gas within stomach small bowel and colon.  No abnormal distention.  Small calcific densities projecting over the inferior aspect of the right renal silhouette, similar distribution to the prior. No calcifications in the region of the left renal silhouette.  No calcifications  projecting over the anatomic pelvis.  Degenerative changes of the lumbar spine and the hips.  No acute displaced fracture.  No radiopaque foreign body.  IMPRESSION: Plain film evidence of right-sided nephrolithiasis, similar to the comparison plain film   Electronically Signed   By: Corrie Mckusick D.O.   On: 09/19/2019 08:49 I have independently reviewed the film images and note a small amount of right lower pole calcifications which is significantly decreased in density and size.  Assessment & Plan:    1. Right kidney stone Patient does not want to pursue any further procedure for the right lower stone as it is not warranted at this time We will continue to monitor He will return in 1 year for KUB He deferred on a 123456 metabolic work-up at this time We will obtain a renal ultrasound to ensure no silent hydronephrosis persists and I will call him with those results  2. BPH  Continue finasteride 5 mg daily PSA pending If PSA is stable return in 1 year for IPSS, PSA and exam   No follow-ups on file.  Zara Council, PA-C  [facility] [address] [place], [place] [address] [phone]

## 2020-09-18 ENCOUNTER — Ambulatory Visit: Payer: Medicare Other | Admitting: Urology

## 2020-10-01 NOTE — Progress Notes (Signed)
10/02/2020 9:51 AM   Marissa Nestle Harbach [birth year] [id number]  CC: Follow up  Urological history 1. Nephrolithiasis - remote history of stones in the 1980's - CTU 03/2019 a 9 mm nonobstructing right renal stone - ESWL 05/2019 - Stone composition calcium oxalate monohydrate - RUS 10/2019 5 mm right lower pole renal stone. No obstructive changes are noted - KUB no stones seen   2. High risk hematuria - Former smoker - CTU 03/2019 Nonobstructing RIGHT renal calculus. No ureterolithiasis or obstructive uropathy.  No enhancing renal lesion.  No bladder stones or filling defects in the bladder which does not excluded a bladder lesion - Cysto 03/2019 enlarged prostate with friable prostatic mucosa, bleeding with endoscopic manipulation - UA negative for micro heme  3. BPH with LU TS - PSA 0.8 in 08/2020 - I PSS 5/1   HPI: DAMONEY JULIA is a 65 y.o. male who presents for KUB and office visit.    He states he passed a stone in November and had associated gross hematuria.  It has not recurred.  And he has not had any further stone passages.    KUB 10/02/2020 no stones seen.  Today's UA is negative for micro heme.  He has no urinary complaints at this time.  He has he is discontinued his tamsulosin and his finasteride.  Patient denies any modifying or aggravating factors.  Patient denies any gross hematuria, dysuria or suprapubic/flank pain.  Patient denies any fevers, chills, nausea or vomiting.    IPSS    Row Name 10/02/20 1000         International Prostate Symptom Score   How often have you had the sensation of not emptying your bladder? Less than 1 in 5     How often have you had to urinate less than every two hours? Less than 1 in 5 times     How often have you found you stopped and started again several times when you urinated? Less than 1 in 5 times     How often have you found it difficult to postpone urination? Not at All     How often have you had a weak urinary  stream? Less than 1 in 5 times     How often have you had to strain to start urination? Not at All     How many times did you typically get up at night to urinate? 1 Time     Total IPSS Score 5           Quality of Life due to urinary symptoms   If you were to spend the rest of your life with your urinary condition just the way it is now how would you feel about that? Pleased            Score:  1-7 Mild 8-19 Moderate 20-35 Severe     PMH: Past Medical History:  Diagnosis Date  . A-fib ([place])   . AICD (automatic cardioverter/defibrillator) present 07/2015   AF, CHF  . CHF (congestive heart failure) ([place])   . Dysrhythmia    Afib  . Family history of adverse reaction to anesthesia   . Heart failure ([place])   . History of kidney stones   . Hyperlipemia   . Hypertension   . Sleep apnea    uses cpap    Surgical History: Past Surgical History:  Procedure Laterality Date  . APPENDECTOMY  2012  . COLECTOMY  07/2011  . EXTRACORPOREAL SHOCK WAVE LITHOTRIPSY  Right 05/17/2019   Procedure: EXTRACORPOREAL SHOCK WAVE LITHOTRIPSY (ESWL);  Surgeon: Hollice Espy, MD;  Location: [facility] ORS;  Service: Urology;  Laterality: Right;  . Implant of defibrillator Left 08-22-15  . KNEE ARTHROSCOPY Left 10/2018   torn meniscus,  workers comp  . KNEE SURGERY Right 2006  . TONSILLECTOMY AND ADENOIDECTOMY  1960    Home Medications:  Allergies as of 10/02/2020      Reactions   Niacin Hives   Other reaction(s): Other (See Comments) Hot flashes   Spironolactone Other (See Comments)   gynecomastia      Medication List       Accurate as of October 02, 2020 11:59 PM. If you have any questions, ask your nurse or doctor.        STOP taking these medications   finasteride 5 MG tablet Commonly known as: PROSCAR Stopped by: Deandria Klute, PA-C   glucose blood test strip Stopped by: Mathieu Schloemer, PA-C   onetouch ultrasoft lancets Stopped by: Kamden Stanislaw, PA-C   tamsulosin 0.4  MG Caps capsule Commonly known as: FLOMAX Stopped by: Mileydi Milsap, PA-C     TAKE these medications   atorvastatin 40 MG tablet Commonly known as: LIPITOR TAKE 1 TABLET (40 MG TOTAL) BY MOUTH ONCE DAILY.   carvedilol 25 MG tablet Commonly known as: COREG Take 25 mg by mouth 2 (two) times daily.   dapagliflozin propanediol 10 MG Tabs tablet Commonly known as: Farxiga Take 1 tablet (10 mg total) by mouth daily.   eplerenone 25 MG tablet Commonly known as: INSPRA Take 25 mg by mouth 2 (two) times daily.   lisinopril 10 MG tablet Commonly known as: ZESTRIL   rivaroxaban 20 MG Tabs tablet Commonly known as: XARELTO Take 20 mg by mouth daily with supper.   torsemide 20 MG tablet Commonly known as: DEMADEX TAKE 1 TABLETS BY MOUTH ONCE DAILY PRN weight gain >230      Allergies:  Allergies  Allergen Reactions  . Niacin Hives    Other reaction(s): Other (See Comments) Hot flashes  . Spironolactone Other (See Comments)    gynecomastia   Family History: Family History  Problem Relation Age of Onset  . Diabetes Mother   . Hypertension Mother   . Kidney disease Mother   . Hyperlipidemia Mother   . Lung cancer Father   . Prostate cancer Father   . Congestive Heart Failure Maternal Grandmother   . Congestive Heart Failure Paternal Grandmother    Social History:  reports that he quit smoking about 38 years ago. His smoking use included cigarettes. He has a 22.00 pack-year smoking history. He has never used smokeless tobacco. He reports that he does not drink alcohol and does not use drugs.  For pertinent review of systems please refer to history of present illness  Physical Exam: BP (!) 170/91   Pulse 76   Ht 5' 9"  (1.753 m)   Wt 220 lb (99.8 kg)   BMI 32.49 kg/m   Constitutional:  Well nourished. Alert and oriented, No acute distress. HEENT: [place] AT, mask in place  Trachea midline Cardiovascular: No clubbing, cyanosis, or edema. Respiratory: Normal respiratory  effort, no increased work of breathing. GU: No CVA tenderness.  No bladder fullness or masses.  Patient with uncircumcised phallus.  Foreskin easily retracted.   Urethral meatus is patent.  No penile discharge. No penile lesions or rashes. Scrotum without lesions, cysts, rashes and/or edema.  Testicles are located scrotally bilaterally. No masses are appreciated in the  testicles. Left and right epididymis are normal. Rectal: Patient with  normal sphincter tone. Anus and perineum without scarring or rashes. No rectal masses are appreciated. Prostate is approximately 50 grams, could only palpate the apex and the midportion of the gland, no nodules are appreciated. Seminal vesicles could not be palpated Lymph: No inguinal adenopathy. Neurologic: Grossly intact, no focal deficits, moving all 4 extremities. Psychiatric: Normal mood and affect.   Laboratory Data: Results for orders placed or performed in visit on 10/02/20  Microscopic Examination   Urine  Result Value Ref Range   WBC, UA 0-5 0 - 5 /hpf   RBC 0-2 0 - 2 /hpf   Epithelial Cells (non renal) 0-10 0 - 10 /hpf   Bacteria, UA None seen None seen/Few  Urinalysis, Complete  Result Value Ref Range   Specific Gravity, UA 1.020 1.005 - 1.030   pH, UA 5.5 5.0 - 7.5   Color, UA Yellow Yellow   Appearance Ur Clear Clear   Leukocytes,UA Negative Negative   Protein,UA 1+ (A) Negative/Trace   Glucose, UA 3+ (A) Negative   Ketones, UA Negative Negative   RBC, UA Negative Negative   Bilirubin, UA Negative Negative   Urobilinogen, Ur 0.2 0.2 - 1.0 mg/dL   Nitrite, UA Negative Negative   Microscopic Examination See below:    Component     Latest Ref Rng & Units 12/10/2019  Cholesterol, Total     100 - 199 mg/dL 132  Triglycerides     0 - 149 mg/dL 324 (H)  HDL Cholesterol     >39 mg/dL 28 (L)  VLDL Cholesterol Cal     5 - 40 mg/dL 50 (H)  LDL Chol Calc (NIH)     0 - 99 mg/dL 54  Total CHOL/HDL Ratio     0.0 - 5.0 ratio 4.7    Component     Latest Ref Rng & Units 11/26/2019  Hemoglobin A1C     4.0 - 5.6 % 8.8 (A)   Specimen:  Blood  Ref Range & Units 2 mo ago Comments  Sodium 135 - 145 mmol/L 141    Potassium 3.5 - 5.0 mmol/L 3.9    Chloride 98 - 108 mmol/L 100    Carbon Dioxide (CO2) 21 - 30 mmol/L 25    Urea Nitrogen (BUN) 7 - 20 mg/dL 27High    Creatinine 0.6 - 1.3 mg/dL 1.3    Glucose 70 - 140 mg/dL 130  Interpretive Data:  Above is the NONFASTING reference range.   Below are the FASTING reference ranges:  NORMAL:   70-99 mg/dL  PREDIABETES: 100-125 mg/dL  DIABETES:  > 125 mg/dL   Calcium 8.7 - 10.2 mg/dL 10.0    AST (Aspartate Aminotransferase) 15 - 41 U/L 27    ALT (Alanine Aminotransferase) 17 - 63 U/L 28    Bilirubin, Total 0.4 - 1.5 mg/dL 1.0    Alk Phos (Alkaline Phosphatase) 24 - 110 U/L 70    Albumin 3.5 - 4.8 g/dL 4.5    Protein, Total 6.2 - 8.1 g/dL 8.2High    Anion Gap 3 - 12 mmol/L 16High    BUN/CREA Ratio 6 - 27 21    Glomerular Filtration Rate (eGFR)  mL/min/1.73sq m 57   NON-Modified eGFR: 57 mL/min/1.73sq m .  We recommend using this value for referral decisions.   Modified eGFR : 66 mL/min/1.73sq m .  This eGFR estimates kidney function using the CKD-Epi equation that increases eGFR by 16% for patients  identified as Black/African-American in the medical record. This value was previously reported as the single eGFR before 02/12/2020.   Interpretive Ranges for eGFR(CKD-EPI):   eGFR:       > 60 mL/min/1.73 sq m - Normal  eGFR:       30 - 59 mL/min/1.73 sq m - Moderately Decreased  eGFR:       15 - 29 mL/min/1.73 sq m - Severely Decreased  eGFR:       < 15 mL/min/1.73 sq m - Kidney Failure   Note: These eGFR calculations do not apply in acute situations  when eGFR is changing rapidly or in patients on dialysis.   Resulting Agency  [facility]   Specimen Collected: 07/15/20 4:13 PM Last Resulted: 07/15/20  6:04 PM  Received From: [facility]  Result Received: 09/10/20 1:31 PM    I have reviewed the labs.  Pertinent Imaging: Narrative & Impression  CLINICAL DATA:  Follow-up right side kidney stone  EXAM: ABDOMEN - 1 VIEW  COMPARISON:  Plain film 09/19/2019.  CT 04/13/2019  FINDINGS: Previously seen right lower pole renal stone not definitively seen on today's study. No suspicious calcification. Nonobstructive bowel gas pattern. No organomegaly or free air.  IMPRESSION: No suspicious calcifications seen.  No acute findings.   Electronically Signed   By: Rolm Baptise M.D.   On: 10/02/2020 13:56  I have independently reviewed the films.  See HPI.     Assessment & Plan:    1. Right kidney stone - likely passed in 06/2020 - no stone seen on today's KUB - UA neg for micro heme  2. BPH with LUTS - IPSS score is 5/1 - Continue conservative management, avoiding bladder irritants and timed voiding's  3. High risk hematuria -work up 03/2019 - stones and hypervascular prostate -Episode of gross hematuria associated with stone passage -Today's UA is negative for micro heme -Patient will contact us if he should experience any further gross hematuria  Return in about 1 year (around 10/02/2021) for IPSS, PSA, KUB, UA and exam.  Zara Council, [facility]  [place] [address], [place], [place] [address] [phone]

## 2020-10-02 ENCOUNTER — Ambulatory Visit (INDEPENDENT_AMBULATORY_CARE_PROVIDER_SITE_OTHER): Payer: Medicare Other | Admitting: Urology

## 2020-10-02 ENCOUNTER — Ambulatory Visit
Admission: RE | Admit: 2020-10-02 | Discharge: 2020-10-02 | Disposition: A | Discharge status: Home / Self Care | Payer: Medicare Other | Source: Ambulatory Visit | Attending: Urology | Admitting: Urology

## 2020-10-02 ENCOUNTER — Other Ambulatory Visit: Payer: Self-pay

## 2020-10-02 ENCOUNTER — Ambulatory Visit
Admission: RE | Admit: 2020-10-02 | Discharge: 2020-10-02 | Disposition: A | Discharge status: Home / Self Care | Payer: Medicare Other | Attending: Urology | Admitting: Urology

## 2020-10-02 VITALS — BP 170/91 | HR 76 | Ht 69.0 in | Wt 220.0 lb

## 2020-10-02 DIAGNOSIS — N200 Calculus of kidney: Secondary | ICD-10-CM

## 2020-10-02 DIAGNOSIS — R319 Hematuria, unspecified: Secondary | ICD-10-CM | POA: Diagnosis not present

## 2020-10-02 DIAGNOSIS — N401 Enlarged prostate with lower urinary tract symptoms: Secondary | ICD-10-CM | POA: Diagnosis not present

## 2020-10-02 DIAGNOSIS — I868 Varicose veins of other specified sites: Secondary | ICD-10-CM

## 2020-10-02 LAB — MICROSCOPIC EXAMINATION: Bacteria, UA: NONE SEEN

## 2020-10-02 LAB — URINALYSIS, COMPLETE
Bilirubin, UA: NEGATIVE
Ketones, UA: NEGATIVE
Leukocytes,UA: NEGATIVE
Nitrite, UA: NEGATIVE
RBC, UA: NEGATIVE
Specific Gravity, UA: 1.02 (ref 1.005–1.030)
Urobilinogen, Ur: 0.2 mg/dL (ref 0.2–1.0)
pH, UA: 5.5 (ref 5.0–7.5)

## 2020-10-16 ENCOUNTER — Encounter: Payer: Self-pay | Admitting: Urology

## 2020-12-18 ENCOUNTER — Other Ambulatory Visit: Payer: Self-pay | Admitting: Family Medicine

## 2020-12-18 DIAGNOSIS — E118 Type 2 diabetes mellitus with unspecified complications: Secondary | ICD-10-CM

## 2020-12-18 NOTE — Telephone Encounter (Signed)
Requested medications are due for refill today.  yes  Requested medications are on the active medications list.  yes  Last refill. 05/27/2020  Future visit scheduled.   no  Notes to clinic.  More than 3 months overdue for office visit.

## 2021-01-13 ENCOUNTER — Telehealth: Payer: Self-pay | Admitting: Family Medicine

## 2021-01-13 DIAGNOSIS — E118 Type 2 diabetes mellitus with unspecified complications: Secondary | ICD-10-CM

## 2021-01-13 MED ORDER — DAPAGLIFLOZIN PROPANEDIOL 10 MG PO TABS: 10.0000 mg | ORAL_TABLET | Freq: Every day | ORAL | 0 refills | Status: DC

## 2021-01-13 NOTE — Telephone Encounter (Signed)
[facility] faxed refill request for the following medications:  FARXIGA 10 MG TABS tablet  Last Rx: 12/18/20 30 day supply 0 refills LOV: 12/10/19 Please advise. Thanks TNP

## 2021-01-16 ENCOUNTER — Other Ambulatory Visit: Payer: Self-pay | Admitting: Family Medicine

## 2021-01-16 DIAGNOSIS — E118 Type 2 diabetes mellitus with unspecified complications: Secondary | ICD-10-CM

## 2021-01-16 NOTE — Telephone Encounter (Signed)
Requested medications are due for refill today.  yes  Requested medications are on the active medications list.  *yes  Last refill. 12/18/2020  Future visit scheduled.   no  Notes to clinic.  Pt already given courtesy refill. Pt is more than 3 months overdue for office visit.

## 2021-02-24 ENCOUNTER — Other Ambulatory Visit: Payer: Self-pay | Admitting: Family Medicine

## 2021-02-24 DIAGNOSIS — E118 Type 2 diabetes mellitus with unspecified complications: Secondary | ICD-10-CM

## 2021-02-24 NOTE — Telephone Encounter (Signed)
Requested medication (s) are due for refill today: Yes  Requested medication (s) are on the active medication list: Yes  Last refill:  01/13/21  Future visit scheduled: No  Notes to clinic:  Unable to refill per protocol, appointment needed. Courtesy refill already given, left VM.      Requested Prescriptions  Pending Prescriptions Disp Refills   FARXIGA 10 MG TABS tablet [Pharmacy Med Name: FARXIGA 10 MG TABLET] 15 tablet 0    Sig: TAKE 1 TABLET (10 MG TOTAL) BY MOUTH DAILY. PLEASE SCHEDULE AN OFFICE VISIT BEFORE ANYMORE REFILLS.      Endocrinology:  Diabetes - SGLT2 Inhibitors Failed - 02/24/2021 12:23 PM      Failed - Cr in normal range and within 360 days    Creatinine, Ser  Date Value Ref Range Status  04/13/2019 1.11 0.61 - 1.24 mg/dL Final          Failed - LDL in normal range and within 360 days    LDL Chol Calc (NIH)  Date Value Ref Range Status  12/10/2019 54 0 - 99 mg/dL Final          Failed - HBA1C is between 0 and 7.9 and within 180 days    Hemoglobin A1C  Date Value Ref Range Status  11/26/2019 8.8 (A) 4.0 - 5.6 % Final   Hgb A1c MFr Bld  Date Value Ref Range Status  06/09/2016 6.9 (H) 4.8 - 5.6 % Final    Comment:             Pre-diabetes: 5.7 - 6.4          Diabetes: >6.4          Glycemic control for adults with diabetes: <7.0           Failed - eGFR in normal range and within 360 days    GFR calc Af Amer  Date Value Ref Range Status  04/13/2019 >60 >60 mL/min Final    Comment:    Performed at [facility], [address] [place], [place] [address]   GFR calc non Af Wyvonnia Lora  Date Value Ref Range Status  04/13/2019 >60 >60 mL/min Final          Failed - Valid encounter within last 6 months    Recent Outpatient Visits           1 year ago Controlled type 2 diabetes mellitus with complication, without long-term current use of insulin [facility])   [place], Vickki Muff, PA-C   1 year ago Hyperglycemia    [employer], Vickki Muff, PA-C   2 years ago Acute maxillary sinusitis, recurrence not specified   [employer], Vickki Muff, PA-C   2 years ago Acute maxillary sinusitis, recurrence not specified   [employer], Vickki Muff, PA-C   2 years ago Annual physical exam   [employer], Vickki Muff, PA-C       Future Appointments             In 7 months [place], Gordan [facility]

## 2021-02-24 NOTE — Telephone Encounter (Signed)
Patient called, left VM to return the call to the office to schedule an OV for yearly appointment in order to continue to receive medication refills.

## 2021-03-05 IMAGING — CR DG ABDOMEN 1V
1 series · 2 of 2 positions shown · non-contrast
Comparison: 05/17/2019

CLINICAL DATA: Follow-up kidney stones

EXAM:
ABDOMEN - 1 VIEW

[Series 1: dg abd 1 view · 0.14mm/px · 2 of 2 slices shown]
[im 1/2]
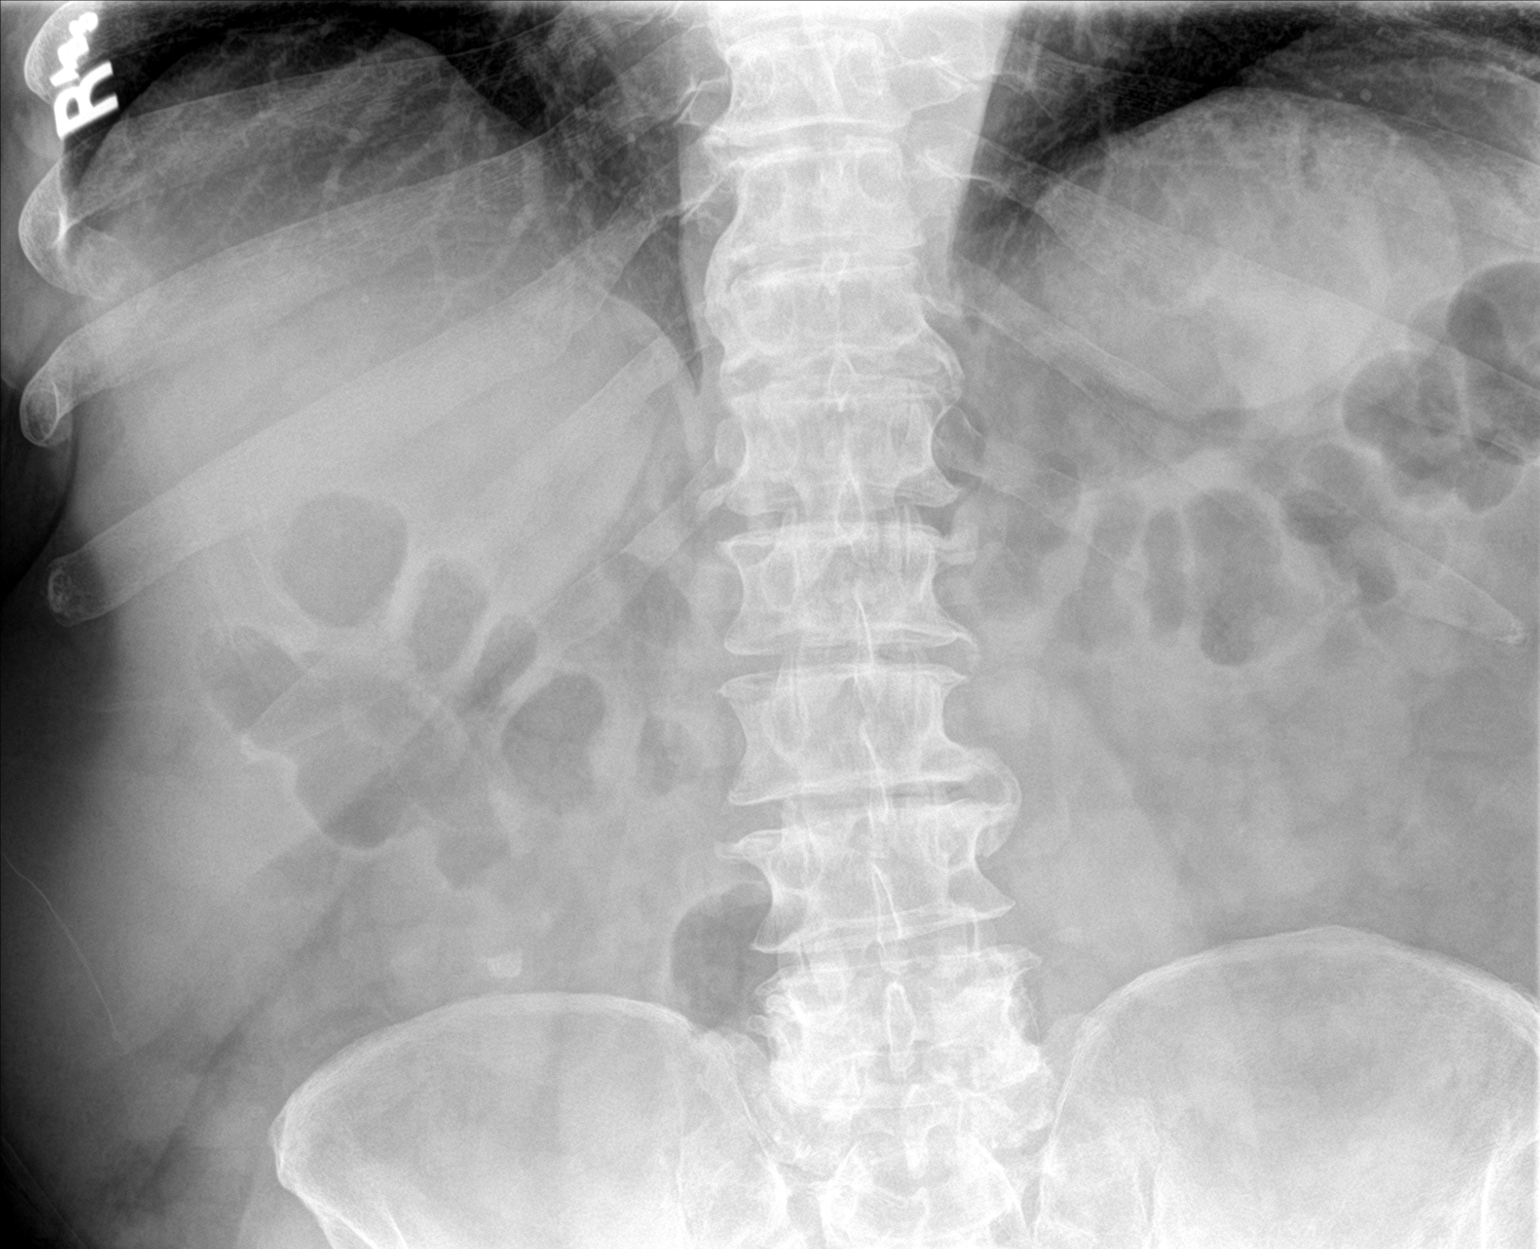
[im 2/2]
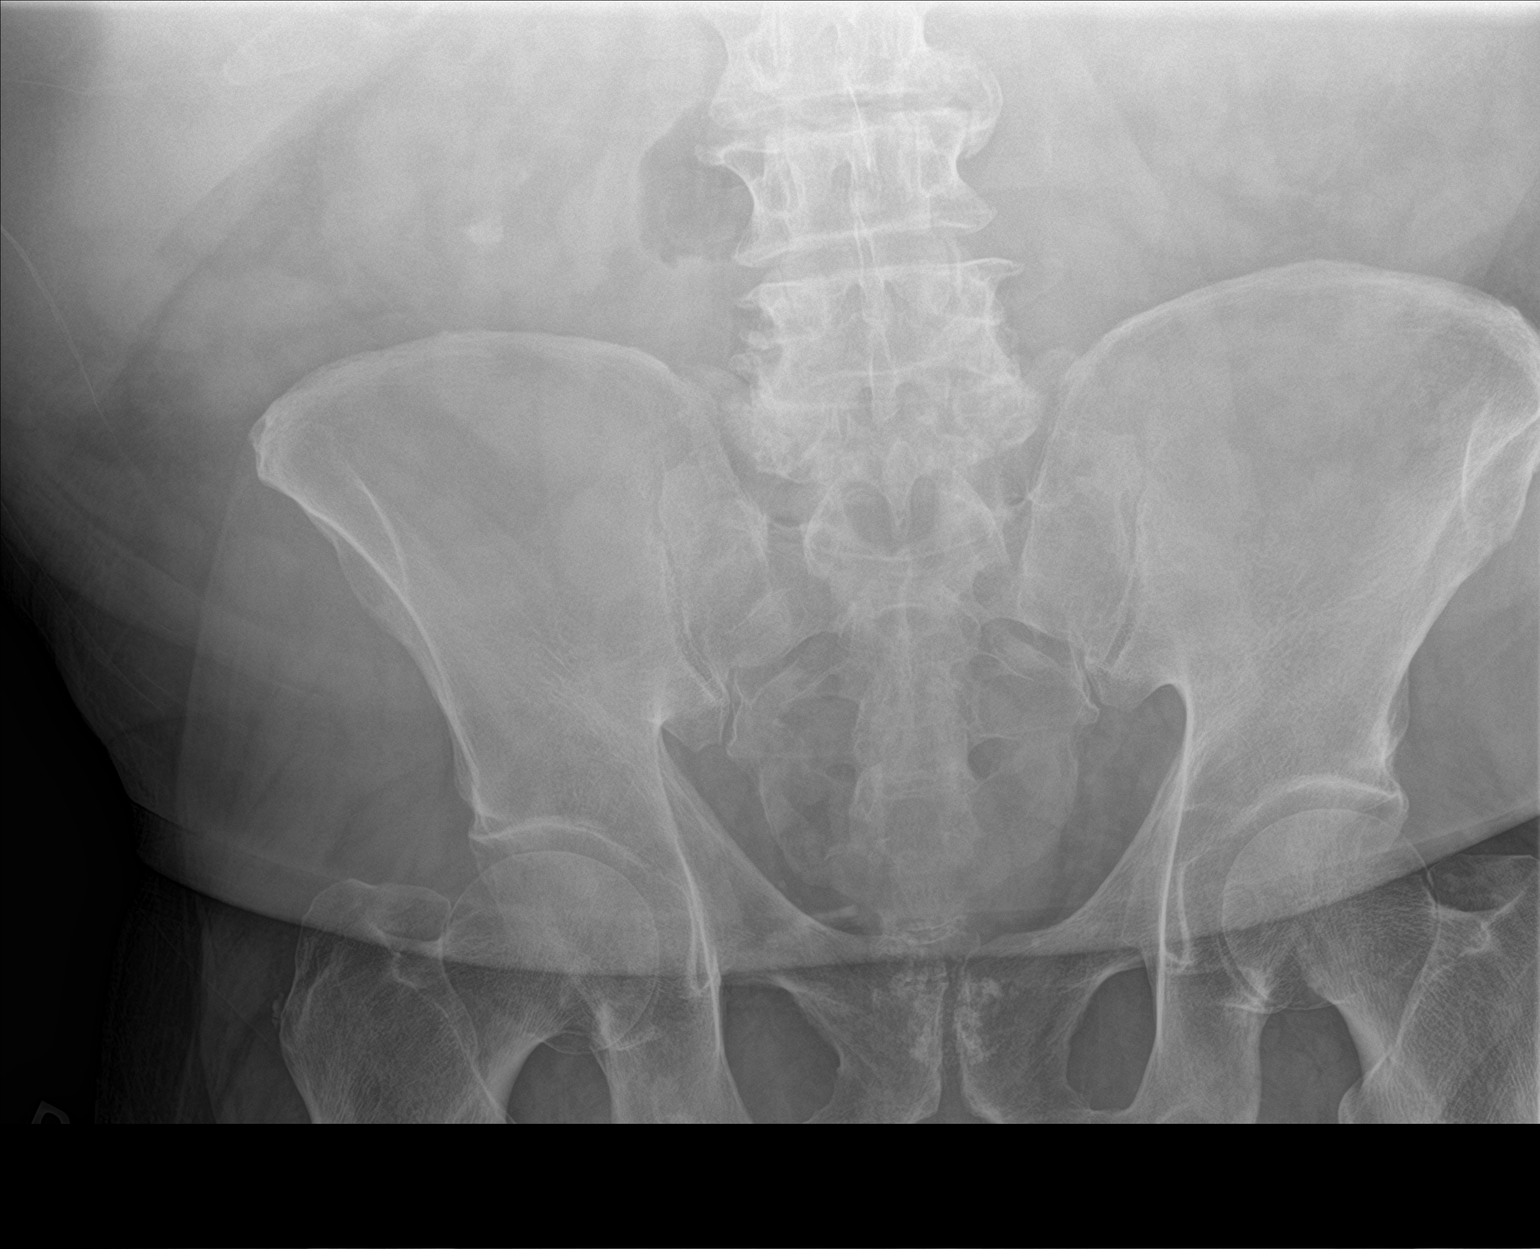

[2 of 2 positions shown; findings below may reference images not displayed]

FINDINGS: Scattered large and small bowel gas is noted. Right lower pole renal
calculus is again identified measuring 9 mm in dimension. There is a
tiny calcification adjacent to the larger stone which may represent
a fragment from the recent lithotripsy. The overall density of the
stone is less. No ureteral calculi are seen. Prosthetic
calcifications are noted. Degenerative change of the lumbar spine is
noted.
IMPRESSION: Slight fragmentation of the lower pole right renal stone when
compared with the prior exam.

## 2021-03-13 ENCOUNTER — Other Ambulatory Visit: Payer: Self-pay | Admitting: Family Medicine

## 2021-03-13 DIAGNOSIS — E118 Type 2 diabetes mellitus with unspecified complications: Secondary | ICD-10-CM

## 2021-03-13 NOTE — Telephone Encounter (Signed)
  Notes to clinic:  courtesy refill already given Patient has not called back to schedule   Requested Prescriptions  Pending Prescriptions Disp Refills   FARXIGA 10 MG TABS tablet [Pharmacy Med Name: FARXIGA 10 MG TABLET] 15 tablet 0    Sig: TAKE 1 TABLET (10 MG TOTAL) BY MOUTH DAILY. PLEASE SCHEDULE AN OFFICE VISIT BEFORE ANYMORE REFILLS.      Endocrinology:  Diabetes - SGLT2 Inhibitors Failed - 03/13/2021  1:41 AM      Failed - Cr in normal range and within 360 days    Creatinine, Ser  Date Value Ref Range Status  04/13/2019 1.11 0.61 - 1.24 mg/dL Final          Failed - LDL in normal range and within 360 days    LDL Chol Calc (NIH)  Date Value Ref Range Status  12/10/2019 54 0 - 99 mg/dL Final          Failed - HBA1C is between 0 and 7.9 and within 180 days    Hemoglobin A1C  Date Value Ref Range Status  11/26/2019 8.8 (A) 4.0 - 5.6 % Final   Hgb A1c MFr Bld  Date Value Ref Range Status  06/09/2016 6.9 (H) 4.8 - 5.6 % Final    Comment:             Pre-diabetes: 5.7 - 6.4          Diabetes: >6.4          Glycemic control for adults with diabetes: <7.0           Failed - eGFR in normal range and within 360 days    GFR calc Af Amer  Date Value Ref Range Status  04/13/2019 >60 >60 mL/min Final    Comment:    Performed at [facility], [address], [place], [place] [address]   GFR calc non Af Wyvonnia Lora  Date Value Ref Range Status  04/13/2019 >60 >60 mL/min Final          Failed - Valid encounter within last 6 months    Recent Outpatient Visits           1 year ago Controlled type 2 diabetes mellitus with complication, without long-term current use of insulin [facility])   [place], Vickki Muff, PA-C   1 year ago Hyperglycemia   [employer], Vickki Muff, PA-C   2 years ago Acute maxillary sinusitis, recurrence not specified   [employer], Vickki Muff, PA-C   2 years ago Acute maxillary  sinusitis, recurrence not specified   [employer], Vickki Muff, PA-C   2 years ago Annual physical exam   [employer], Vickki Muff, PA-C       Future Appointments             In 6 months [place], Gordan [facility]

## 2021-03-29 ENCOUNTER — Other Ambulatory Visit: Payer: Self-pay | Admitting: Family Medicine

## 2021-03-29 DIAGNOSIS — E118 Type 2 diabetes mellitus with unspecified complications: Secondary | ICD-10-CM

## 2021-03-29 NOTE — Telephone Encounter (Signed)
Pt due for appointment and refill. Appt made. Pt sated that he does not need a refill at this time.  Pt stated that he has been taking the [place] every other day due to the cost of the medication. Advised pt to discuss at his upcoming 04/06/21.

## 2021-03-30 NOTE — Telephone Encounter (Signed)
Please Review

## 2021-04-06 ENCOUNTER — Ambulatory Visit (INDEPENDENT_AMBULATORY_CARE_PROVIDER_SITE_OTHER): Payer: Medicare Other | Admitting: Family Medicine

## 2021-04-06 ENCOUNTER — Encounter: Payer: Self-pay | Admitting: Family Medicine

## 2021-04-06 ENCOUNTER — Other Ambulatory Visit: Payer: Self-pay

## 2021-04-06 VITALS — BP 163/93 | HR 76 | Temp 98.4°F | Ht 69.0 in | Wt 226.0 lb

## 2021-04-06 DIAGNOSIS — I10 Essential (primary) hypertension: Secondary | ICD-10-CM | POA: Diagnosis not present

## 2021-04-06 DIAGNOSIS — Z95810 Presence of automatic (implantable) cardiac defibrillator: Secondary | ICD-10-CM | POA: Diagnosis not present

## 2021-04-06 DIAGNOSIS — E118 Type 2 diabetes mellitus with unspecified complications: Secondary | ICD-10-CM

## 2021-04-06 DIAGNOSIS — I5042 Chronic combined systolic (congestive) and diastolic (congestive) heart failure: Secondary | ICD-10-CM | POA: Diagnosis not present

## 2021-04-06 DIAGNOSIS — E781 Pure hyperglyceridemia: Secondary | ICD-10-CM

## 2021-04-06 NOTE — Progress Notes (Signed)
Established patient visit   Patient: Robert Grant   DOB: 10/03/1955   65 y.o. Male  MRN: [id number] Visit Date: 04/06/2021  Today's healthcare provider: Vernie Murders, PA-C   No chief complaint on file.  Subjective    HPI  Medication Refills. Patient reports to be sleeping well, eats well consuming an in general diet. No regular exercise routine outside of daily errands.    Patient Active Problem List   Diagnosis Date Noted   Pseudophakia of right eye 02/23/2017   Hematuria, microscopic 06/08/2016   Obstructive sleep apnea 01/21/2016   Automatic implantable cardioverter-defibrillator in situ 12/23/2015   Heart failure ([place]) 04/28/2015   Chronic combined systolic and diastolic heart failure ([place]) 04/28/2015   Cardiomyopathy ([place]) 04/22/2015   Acute CHF ([place]) 04/12/2015   A-fib ([place]) 04/12/2015   Beat, premature ventricular 03/31/2015   Paroxysmal atrial fibrillation (HCC) 03/28/2015   Compulsive tobacco user syndrome 02/14/2015   Chest pain 02/14/2015   Annual physical exam 02/14/2015   Closed fracture of nasal bones 02/06/2015   Dizziness 02/06/2015   Hypertriglyceridemia 02/06/2015   Basal cell papilloma 02/06/2015   Feeling stressed out 02/06/2015   Snores 02/06/2015   H/O cardiac catheterization 05/16/2014   Awareness of heartbeats 04/19/2014   Breath shortness 04/02/2014   Decreased potassium in the blood 10/16/2007   Essential (primary) hypertension 11/28/2006   HLD (hyperlipidemia) 11/28/2006   Past Medical History:  Diagnosis Date   A-fib [facility])    AICD (automatic cardioverter/defibrillator) present 07/2015   AF, CHF   CHF (congestive heart failure) (HCC)    Dysrhythmia    Afib   Family history of adverse reaction to anesthesia    Heart failure ([place])    History of kidney stones    Hyperlipemia    Hypertension    Sleep apnea    uses cpap   Allergies  Allergen Reactions   Niacin Hives    Other reaction(s): Other (See Comments) Hot  flashes   Spironolactone Other (See Comments)    gynecomastia   Past Medical History:  Diagnosis Date   A-fib [facility])    AICD (automatic cardioverter/defibrillator) present 07/2015   AF, CHF   CHF (congestive heart failure) ([place])    Dysrhythmia    Afib   Family history of adverse reaction to anesthesia    Heart failure ([place])    History of kidney stones    Hyperlipemia    Hypertension    Sleep apnea    uses cpap   Past Surgical History:  Procedure Laterality Date   APPENDECTOMY  2012   COLECTOMY  07/2011   EXTRACORPOREAL SHOCK WAVE LITHOTRIPSY Right 05/17/2019   Procedure: EXTRACORPOREAL SHOCK WAVE LITHOTRIPSY (ESWL);  Surgeon: Hollice Espy, MD;  Location: [facility] ORS;  Service: Urology;  Laterality: Right;   Implant of defibrillator Left 08-22-15   KNEE ARTHROSCOPY Left 10/2018   torn meniscus,  workers comp   KNEE SURGERY Right 2006   TONSILLECTOMY AND ADENOIDECTOMY  1960   Family History  Problem Relation Age of Onset   Diabetes Mother    Hypertension Mother    Kidney disease Mother    Hyperlipidemia Mother    Lung cancer Father    Prostate cancer Father    Congestive Heart Failure Maternal Grandmother    Congestive Heart Failure Paternal Grandmother    Social History   Tobacco Use   Smoking status: Former    Packs/day: 2.00    Years: 11.00    Pack  years: 22.00    Types: Cigarettes    Quit date: 08/1982    Years since quitting: 38.6   Smokeless tobacco: Never  Vaping Use   Vaping Use: Never used  Substance Use Topics   Alcohol use: No    Alcohol/week: 0.0 standard drinks   Drug use: No   Allergies  Allergen Reactions   Niacin Hives    Other reaction(s): Other (See Comments) Hot flashes   Spironolactone Other (See Comments)    gynecomastia    Medications: Outpatient Medications Prior to Visit  Medication Sig   atorvastatin (LIPITOR) 40 MG tablet TAKE 1 TABLET (40 MG TOTAL) BY MOUTH ONCE DAILY.   carvedilol (COREG) 25 MG tablet Take 25 mg by  mouth 2 (two) times daily.   eplerenone (INSPRA) 25 MG tablet Take 25 mg by mouth 2 (two) times daily.    FARXIGA 10 MG TABS tablet TAKE 1 TABLET (10 MG TOTAL) BY MOUTH DAILY. PLEASE SCHEDULE AN OFFICE VISIT BEFORE ANYMORE REFILLS.   lisinopril (ZESTRIL) 10 MG tablet    rivaroxaban (XARELTO) 20 MG TABS tablet Take 20 mg by mouth daily with supper.   torsemide (DEMADEX) 20 MG tablet TAKE 1 TABLETS BY MOUTH ONCE DAILY PRN weight gain >230   No facility-administered medications prior to visit.    Review of Systems  Last CBC Lab Results  Component Value Date   WBC 8.1 12/06/2017   HGB 12.0 (L) 12/06/2017   HCT 37.4 (L) 12/06/2017   MCV 90 12/06/2017   MCH 28.8 12/06/2017   RDW 14.3 12/06/2017   PLT 171 [id number]   Last metabolic panel Lab Results  Component Value Date   GLUCOSE 119 (H) 12/06/2017   NA 140 12/06/2017   K 4.2 12/06/2017   CL 101 12/06/2017   CO2 25 12/06/2017   BUN 15 12/06/2017   CREATININE 1.11 04/13/2019   GFRNONAA >60 04/13/2019   GFRAA >60 04/13/2019   CALCIUM 9.2 12/06/2017   PROT 6.6 12/06/2017   ALBUMIN 4.6 12/06/2017   LABGLOB 2.0 12/06/2017   AGRATIO 2.3 (H) 12/06/2017   BILITOT 0.5 12/06/2017   ALKPHOS 65 12/06/2017   AST 24 12/06/2017   ALT 26 12/06/2017   ANIONGAP 13 04/12/2015   Last lipids Lab Results  Component Value Date   CHOL 132 12/10/2019   HDL 28 (L) 12/10/2019   LDLCALC 54 12/10/2019   TRIG 324 (H) 12/10/2019   CHOLHDL 4.7 12/10/2019   Last hemoglobin A1c Lab Results  Component Value Date   HGBA1C 8.8 (A) 11/26/2019   Last thyroid functions Lab Results  Component Value Date   TSH 2.200 06/09/2016   Last vitamin D No results found for: 25OHVITD2, 25OHVITD3, VD25OH Last vitamin B12 and Folate No results found for: VITAMINB12, FOLATE     Objective    BP (!) 163/93 (BP Location: Right Arm, Patient Position: Sitting, Cuff Size: Large)   Pulse 76   Temp 98.4 F (36.9 C) (Oral)   Ht '5\' 9"'$  (1.753 m)   Wt 226 lb  (102.5 kg)   SpO2 95%   BMI 33.37 kg/m  BP Readings from Last 3 Encounters:  10/02/20 (!) 170/91  12/10/19 140/88  11/26/19 140/82   Wt Readings from Last 3 Encounters:  10/02/20 220 lb (99.8 kg)  12/10/19 227 lb (103 kg)  11/26/19 231 lb (104.8 kg)   Physical Exam Constitutional:      General: He is not in acute distress.    Appearance: He is well-developed.  HENT:     Head: Normocephalic and atraumatic.     Right Ear: Hearing normal.     Left Ear: Hearing normal.     Nose: Nose normal.  Eyes:     General: Lids are normal. No scleral icterus.       Right eye: No discharge.        Left eye: No discharge.     Conjunctiva/sclera: Conjunctivae normal.  Cardiovascular:     Rate and Rhythm: Normal rate and regular rhythm.     Heart sounds: Normal heart sounds.  Pulmonary:     Effort: Pulmonary effort is normal. No respiratory distress.     Breath sounds: Normal breath sounds.  Abdominal:     General: Bowel sounds are normal.     Palpations: Abdomen is soft.  Musculoskeletal:        General: Normal range of motion.     Cervical back: Neck supple.  Skin:    Findings: No lesion or rash.  Neurological:     Mental Status: He is alert and oriented to person, place, and time.  Psychiatric:        Speech: Speech normal.        Behavior: Behavior normal.        Thought Content: Thought content normal.      No results found for any visits on 04/06/21.  Assessment & Plan     1. Controlled type 2 diabetes mellitus with complication, without long-term current use of insulin (HCC) Finding Farxiga 10 mg qd is very expensive. Usually uses it QOD. Still on Atorvastatin and Lisinopril. - CBC with Differential/Platelet - Comprehensive metabolic panel - Lipid panel - Hemoglobin A1c  2. Essential (primary) hypertension Still on Lisinopril 10 mg qd, Carvedilol 25 mg BID, Inspra 25 mg 2 qd and followed by cardiologist. Recheck labs. BP elevated today. - CBC with  Differential/Platelet - Comprehensive metabolic panel - Lipid panel - TSH  3. Chronic combined systolic and diastolic heart failure (HCC) Still on Torsemide prn edema, Carvedilol, Inspra, Xarelto (for chronic A. Fib) and followed by cardiologist. Check routine labs. - CBC with Differential/Platelet - Comprehensive metabolic panel  4. Automatic implantable cardioverter-defibrillator in situ Followed by cardiologist (Dr. Tammi Klippel). Asymptomatic.  5. Hypertriglyceridemia Tolerating Atorvastatin 40 mg qd and following low fat diet. Check follow up labs. - CBC with Differential/Platelet - Comprehensive metabolic panel - Lipid panel - TSH   No follow-ups on file.      I, Phiona Ramnauth, PA-C, have reviewed all documentation for this visit. The documentation on 04/06/21 for the exam, diagnosis, procedures, and orders are all accurate and complete.    Vernie Murders, PA-C  [employer] [phone] (phone) [phone] (fax)  [place]

## 2021-04-25 LAB — COMPREHENSIVE METABOLIC PANEL
ALT: 22 IU/L (ref 0–44)
AST: 20 IU/L (ref 0–40)
Albumin/Globulin Ratio: 2.2 (ref 1.2–2.2)
Albumin: 4.6 g/dL (ref 3.8–4.8)
Alkaline Phosphatase: 86 IU/L (ref 44–121)
BUN/Creatinine Ratio: 12 (ref 10–24)
BUN: 11 mg/dL (ref 8–27)
Bilirubin Total: 0.8 mg/dL (ref 0.0–1.2)
CO2: 21 mmol/L (ref 20–29)
Calcium: 9.2 mg/dL (ref 8.6–10.2)
Chloride: 101 mmol/L (ref 96–106)
Creatinine, Ser: 0.9 mg/dL (ref 0.76–1.27)
Globulin, Total: 2.1 g/dL (ref 1.5–4.5)
Glucose: 185 mg/dL — ABNORMAL HIGH (ref 65–99)
Potassium: 3.7 mmol/L (ref 3.5–5.2)
Sodium: 141 mmol/L (ref 134–144)
Total Protein: 6.7 g/dL (ref 6.0–8.5)
eGFR: 94 mL/min/{1.73_m2} (ref >59)

## 2021-04-25 LAB — LIPID PANEL
Chol/HDL Ratio: 7 ratio — ABNORMAL HIGH (ref 0.0–5.0)
Cholesterol, Total: 174 mg/dL (ref 100–199)
HDL: 25 mg/dL — ABNORMAL LOW (ref >39)
LDL Chol Calc (NIH): 52 mg/dL (ref 0–99)
Triglycerides: 660 mg/dL (ref 0–149)
VLDL Cholesterol Cal: 97 mg/dL — ABNORMAL HIGH (ref 5–40)

## 2021-04-25 LAB — CBC WITH DIFFERENTIAL/PLATELET
Basophils Absolute: 0.1 10*3/uL (ref 0.0–0.2)
Basos: 1 %
EOS (ABSOLUTE): 0.3 10*3/uL (ref 0.0–0.4)
Eos: 4 %
Hematocrit: 44.5 % (ref 37.5–51.0)
Hemoglobin: 14.6 g/dL (ref 13.0–17.7)
Immature Grans (Abs): 0 10*3/uL (ref 0.0–0.1)
Immature Granulocytes: 0 %
Lymphocytes Absolute: 2.4 10*3/uL (ref 0.7–3.1)
Lymphs: 35 %
MCH: 29.1 pg (ref 26.6–33.0)
MCHC: 32.8 g/dL (ref 31.5–35.7)
MCV: 89 fL (ref 79–97)
Monocytes Absolute: 0.6 10*3/uL (ref 0.1–0.9)
Monocytes: 8 %
Neutrophils Absolute: 3.6 10*3/uL (ref 1.4–7.0)
Neutrophils: 52 %
Platelets: 142 10*3/uL — ABNORMAL LOW (ref 150–450)
RBC: 5.01 x10E6/uL (ref 4.14–5.80)
RDW: 13.7 % (ref 11.6–15.4)
WBC: 6.9 10*3/uL (ref 3.4–10.8)

## 2021-04-25 LAB — HEMOGLOBIN A1C
Est. average glucose Bld gHb Est-mCnc: 171 mg/dL
Hgb A1c MFr Bld: 7.6 % — ABNORMAL HIGH (ref 4.8–5.6)

## 2021-04-25 LAB — TSH: TSH: 1.87 u[IU]/mL (ref 0.450–4.500)

## 2021-05-04 ENCOUNTER — Other Ambulatory Visit: Payer: Self-pay | Admitting: Family Medicine

## 2021-05-06 ENCOUNTER — Ambulatory Visit: Payer: Self-pay | Admitting: *Deleted

## 2021-05-06 NOTE — Telephone Encounter (Signed)
Loss hearing in right ear, and a little in the left ear, patient experiencing head aches. No available appointments in the office until 05/12/2021, patient seeking clinical advice. Placed patient on the wait list   Pt reports facial pain, onset Saturday. Also reports decreased hearing both ears, right sided jaw pain, headache. Headache and jaw pain intermittent, intensity varies from 2-9/10. Headache "Over eyes." States "Feel a little congested but no cough or drainage."  Chills at times. Reports "Feels like when I have a sinus infection." No availability within 24 hr timeframe protocol. Attempted to reach practice, extended ring. Pt states "He will usually call in a antibiotic for me." Advised would need to be seen. Agent had placed pt on waitlist. Advised UC if pt can not be seen today.States he will go this afternoon.  Pt verbalizes understanding of care advise. CB# [phone] Reason for Disposition  Face pain present > 24 hours  Answer Assessment - Initial Assessment Questions 1. ONSET: "When did the pain start?" (e.g., minutes, hours, days)     Saturday 2. ONSET: "Does the pain come and go, or has it been constant since it started?" (e.g., constant, intermittent, fleeting)     Comes and goes headache, jaw pain right sided 3. SEVERITY: "How bad is the pain?"   (Scale 1-10; mild, moderate or severe)   - MILD (1-3): doesn't interfere with normal activities    - MODERATE (4-7): interferes with normal activities or awakens from sleep    - SEVERE (8-10): excruciating pain, unable to do any normal activities      Varies 2-9/10 4. LOCATION: "Where does it hurt?"      Right jaw, headache in front, front of eyes 5. RASH: "Is there any redness, rash, or swelling of the face?"     Maybe mild at right jaw bone 6. FEVER: "Do you have a fever?" If Yes, ask: "What is it, how was it measured, and when did it start?"      Subjective 7. OTHER SYMPTOMS: "Do you have any other symptoms?" (e.g., fever,  toothache, nasal discharge, nasal congestion, clicking sensation in jaw joint)     No drainage but feels like chest congested  Protocols used: Face Pain-A-AH

## 2021-05-06 NOTE — Telephone Encounter (Signed)
Agree with plan to go to UC today.

## 2021-05-07 ENCOUNTER — Ambulatory Visit: Payer: Self-pay

## 2021-05-07 NOTE — Telephone Encounter (Signed)
Pt states his blood sugars have been running between 120-140's but since starting prednisone it is now running 165-180's. He is only taking [place] every other day right now.  He is wanting to know if he should change to everyday.   Thanks,   -Mickel Baas

## 2021-05-07 NOTE — Telephone Encounter (Signed)
Patient says on yesterday he was able to see his ENT about his ear (see triage encounter on yesterday). He says the ENT said he has a viral infection, so he prescribed Prednisone 10 mg tapering dose x 12 days and a nasal spray. The patient says he was advised to keep check on his blood sugar while on the prednisone. He says his blood sugars have been running 170's-180's. He wants to know if he should double up on his [place] while he's on the prednisone. I advised to continue keeping record of his blood sugars, take farxiga as prescribed, someone will call back with Simona Huh recommendation, patient verbalized understanding.    Summary: Clinical Advice   Patient was seen by ENT yesterday and was dx with a ear viral infection and prescribed prednisone, patient has not been taking it every day but every other day but will start stating it as prescribed. Patient states prednisone is affecting his blood sugar ranging from 120-140. Patient seeking clinical advice and requesting to speak with a nurse.    Also advised patient to contact the prescribing doctor to make him aware because the specialist is aware of medication patient is on.      Reason for Disposition  [1] Caller has NON-URGENT medicine question about med that PCP prescribed AND [2] triager unable to answer question  Answer Assessment - Initial Assessment Questions 1. NAME of MEDICATION: "What medicine are you calling about?"     Farxiga 2. QUESTION: "What is your question?" (e.g., double dose of medicine, side effect)     Should I double up on it for the next few weeks 3. PRESCRIBING HCP: "Who prescribed it?" Reason: if prescribed by specialist, call should be referred to that group.     N/A 4. SYMPTOMS: "Do you have any symptoms?"     N/A 5. SEVERITY: If symptoms are present, ask "Are they mild, moderate or severe?"     N/A 6. PREGNANCY:  "Is there any chance that you are pregnant?" "When was your last menstrual period?"      N/A  Protocols used: Medication Question Call-A-AH

## 2021-05-07 NOTE — Telephone Encounter (Signed)
Recommend he use the Farxiga 10 mg qd while taking the prednisone.

## 2021-05-07 NOTE — Telephone Encounter (Signed)
Need clarification of nurse note above. Two different reports of blood sugar ranges.

## 2021-05-22 ENCOUNTER — Other Ambulatory Visit: Payer: Self-pay | Admitting: Otolaryngology

## 2021-05-22 ENCOUNTER — Other Ambulatory Visit (HOSPITAL_COMMUNITY): Payer: Self-pay | Admitting: Otolaryngology

## 2021-05-22 DIAGNOSIS — H903 Sensorineural hearing loss, bilateral: Secondary | ICD-10-CM

## 2021-06-02 ENCOUNTER — Ambulatory Visit: Payer: Medicare Other

## 2021-06-25 IMAGING — CR DG ABDOMEN 1V
2 series · 2 of 2 positions shown · non-contrast
Comparison: 06/14/2019

CLINICAL DATA: 63-year-old male with nephrolithiasis

EXAM:
ABDOMEN - 1 VIEW

[abdomen kub (1 of 2)]
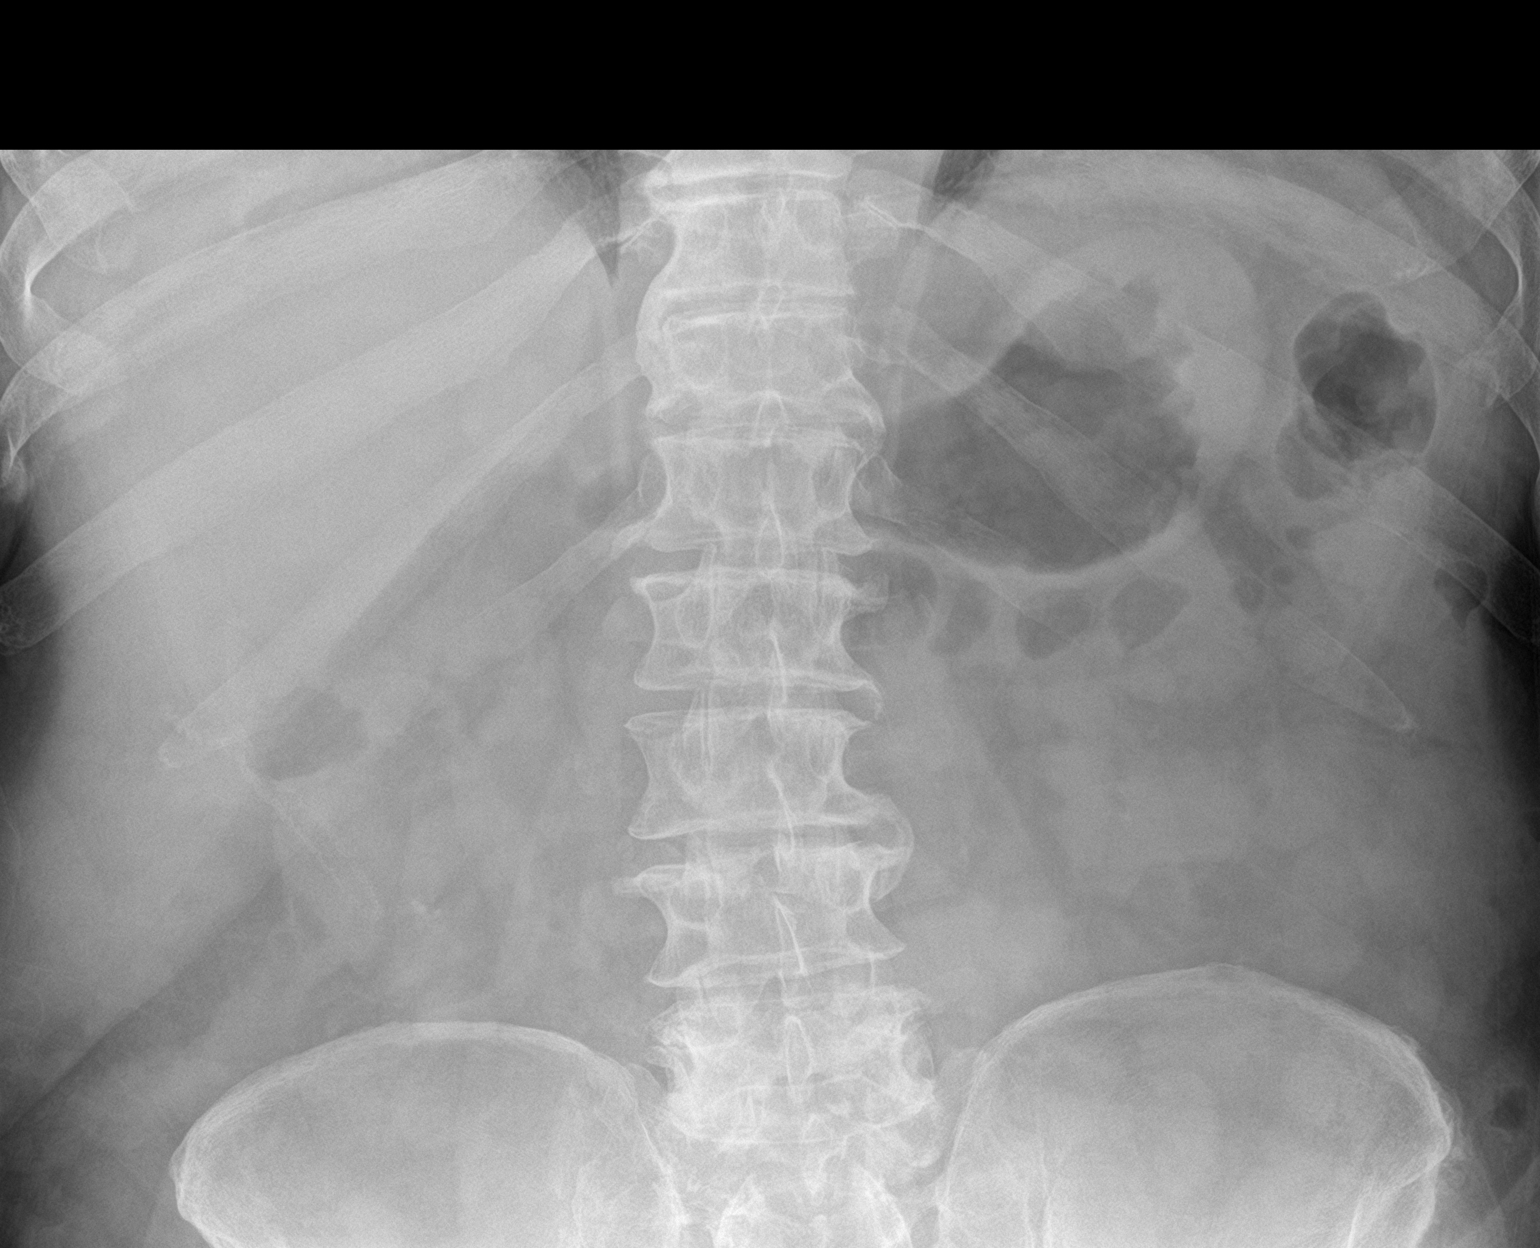

[abdomen kub (2 of 2)]
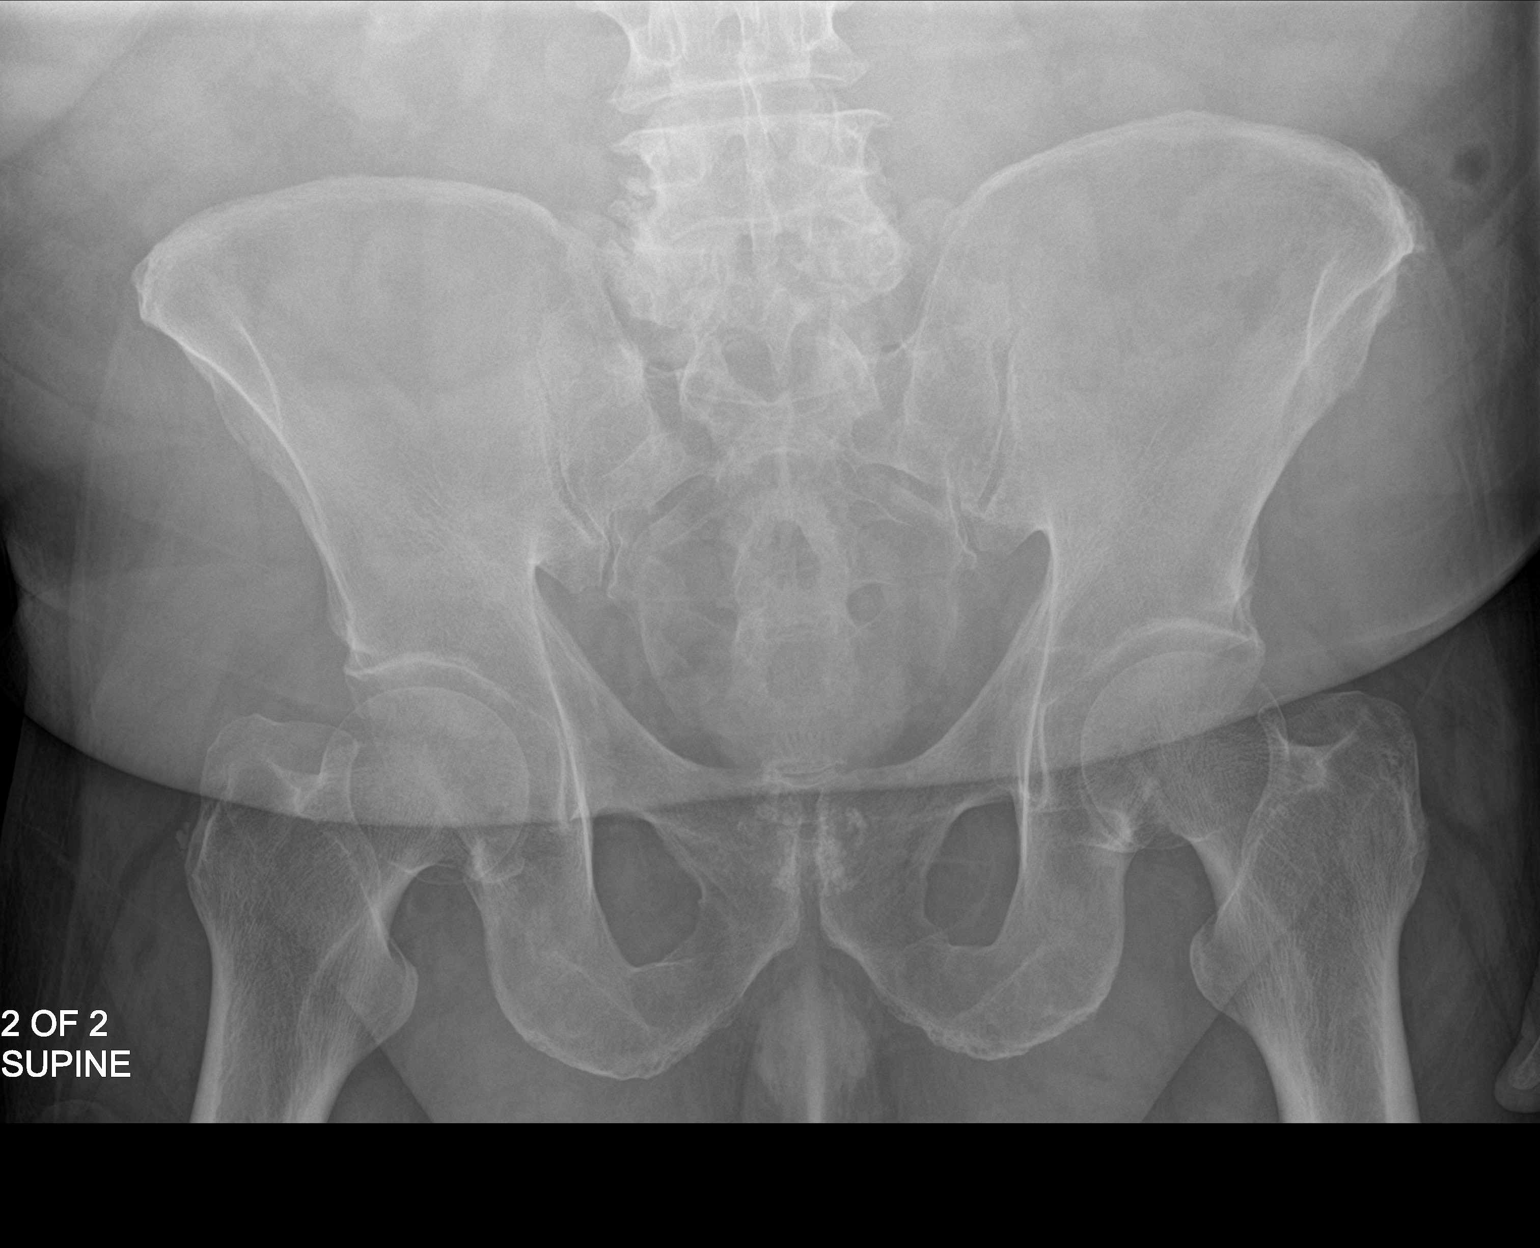

[2 of 2 positions shown; findings below may reference images not displayed]

FINDINGS: Gas within stomach small bowel and colon.  No abnormal distention.

Small calcific densities projecting over the inferior aspect of the
right renal silhouette, similar distribution to the prior. No
calcifications in the region of the left renal silhouette.

No calcifications projecting over the anatomic pelvis.

Degenerative changes of the lumbar spine and the hips.

No acute displaced fracture.

No radiopaque foreign body.
IMPRESSION: Plain film evidence of right-sided nephrolithiasis, similar to the
comparison plain film

## 2021-06-26 ENCOUNTER — Other Ambulatory Visit: Payer: Self-pay | Admitting: Family Medicine

## 2021-06-26 DIAGNOSIS — E118 Type 2 diabetes mellitus with unspecified complications: Secondary | ICD-10-CM

## 2021-06-26 MED ORDER — DAPAGLIFLOZIN PROPANEDIOL 10 MG PO TABS: ORAL_TABLET | ORAL | 0 refills | Status: DC

## 2021-06-26 NOTE — Telephone Encounter (Signed)
[facility] faxed refill request for the following medications:    FARXIGA 10 MG TABS tablet   Please advise.

## 2021-07-09 ENCOUNTER — Ambulatory Visit (HOSPITAL_COMMUNITY): Admission: RE | Admit: 2021-07-09 | Payer: Medicare Other | Source: Ambulatory Visit

## 2021-07-09 ENCOUNTER — Encounter (HOSPITAL_COMMUNITY): Payer: Self-pay

## 2021-07-09 IMAGING — US US RENAL
1 series · 14 of 25 positions shown · non-contrast
Comparison: 06/21/2019

CLINICAL DATA: History of kidney stone with prior lithotripsy

EXAM:
RENAL / URINARY TRACT ULTRASOUND COMPLETE

[Series 1: us renal · 0.26mm/px · 14 of 43 slices shown]
[im 1/43]
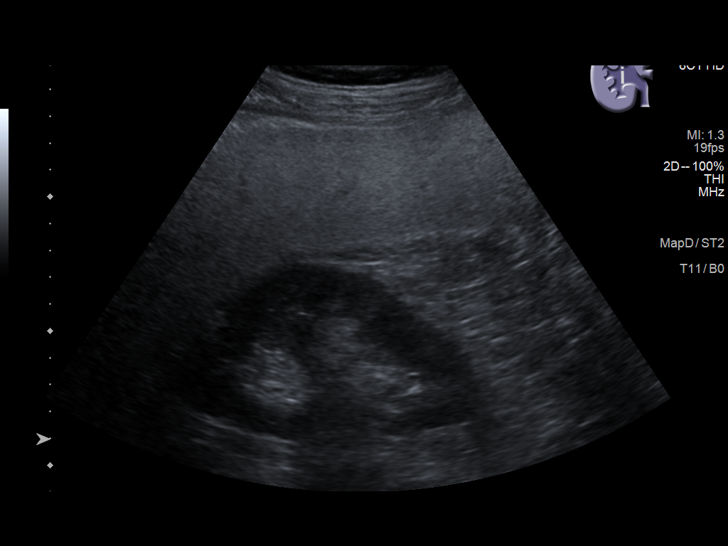
[im 4/43]
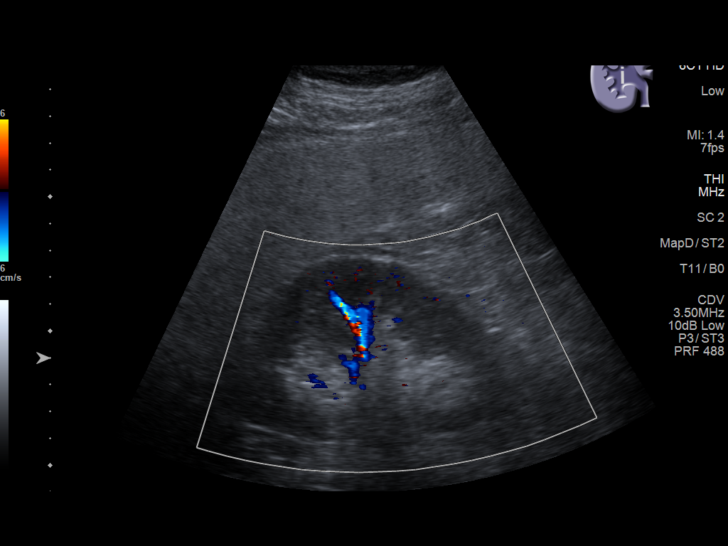
[im 8/43]
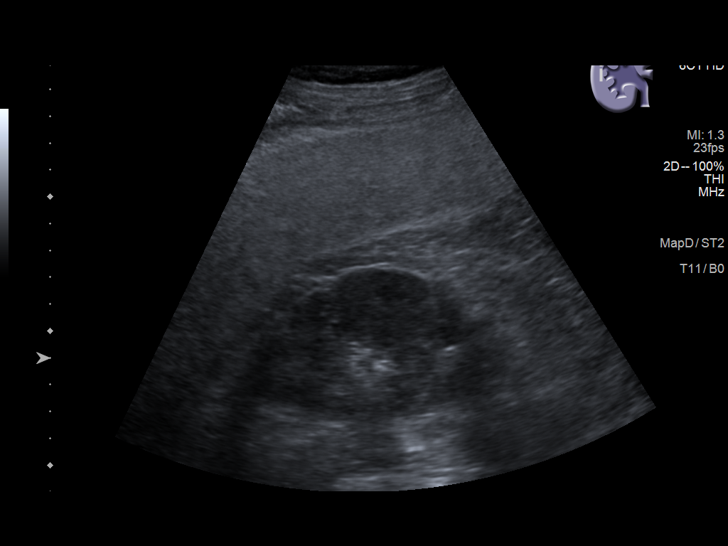
[im 11/43]
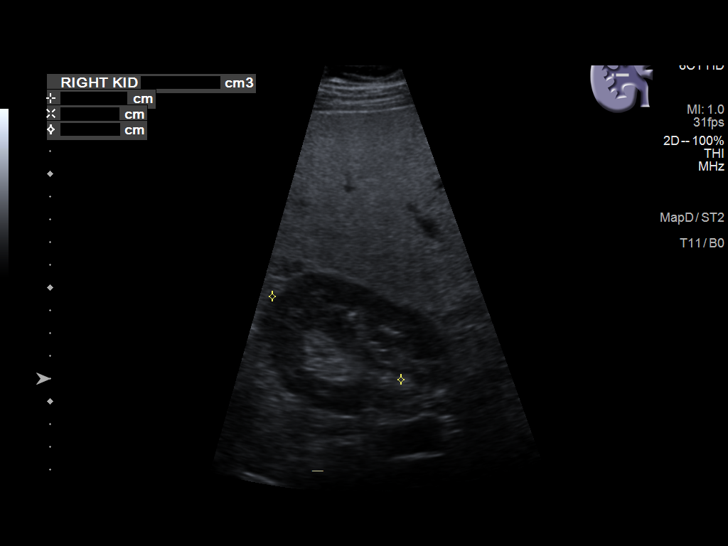
[im 15/43]
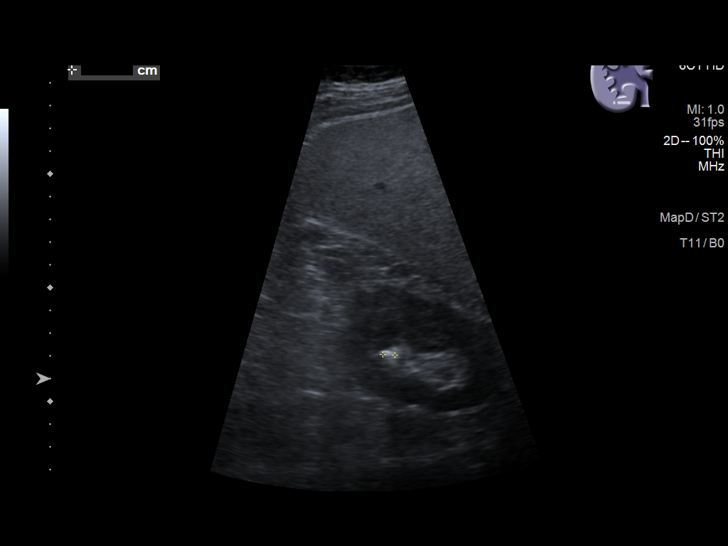
[im 16/43]
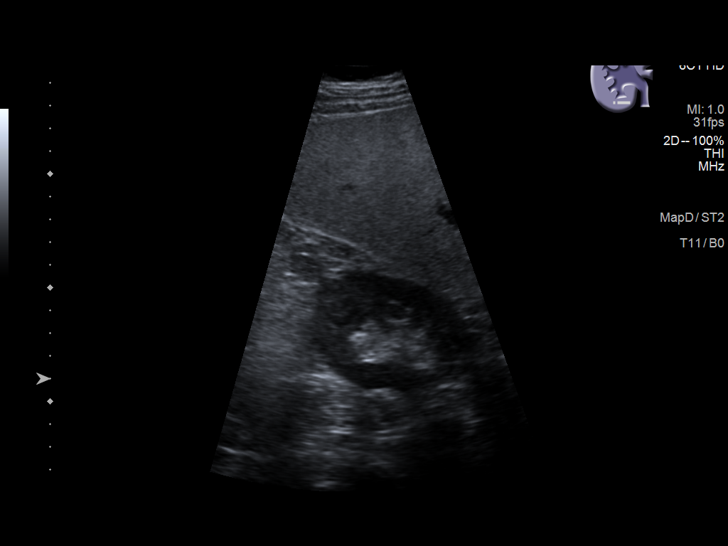
[im 20/43]
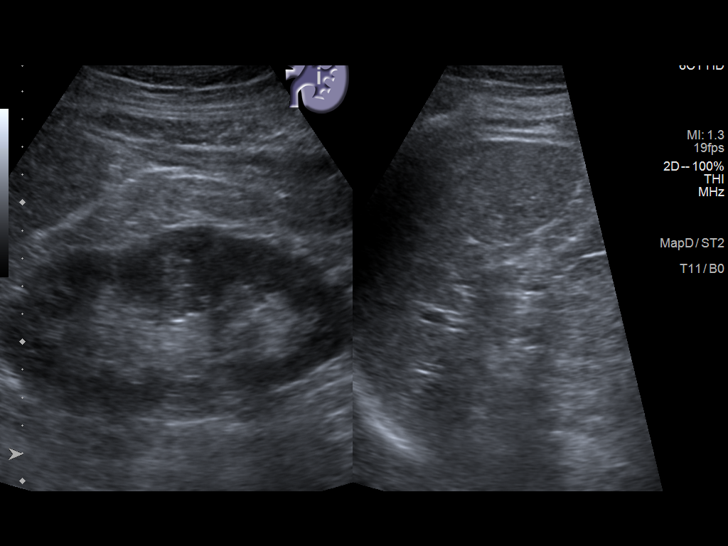
[im 23/43]
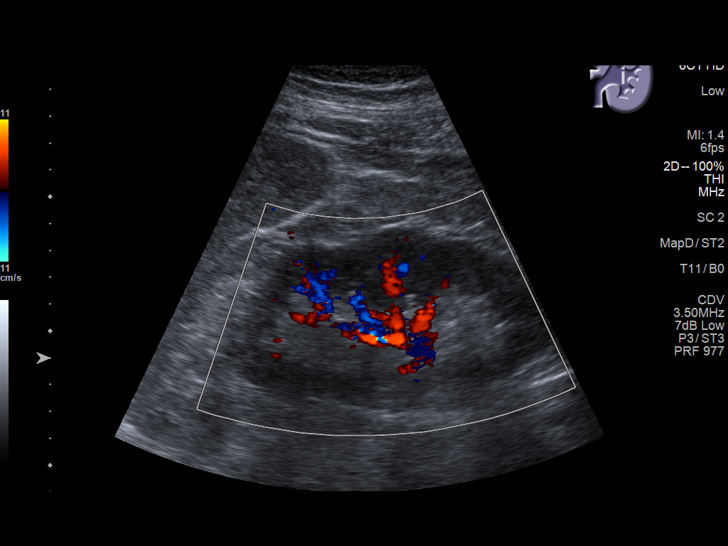
[im 27/43]
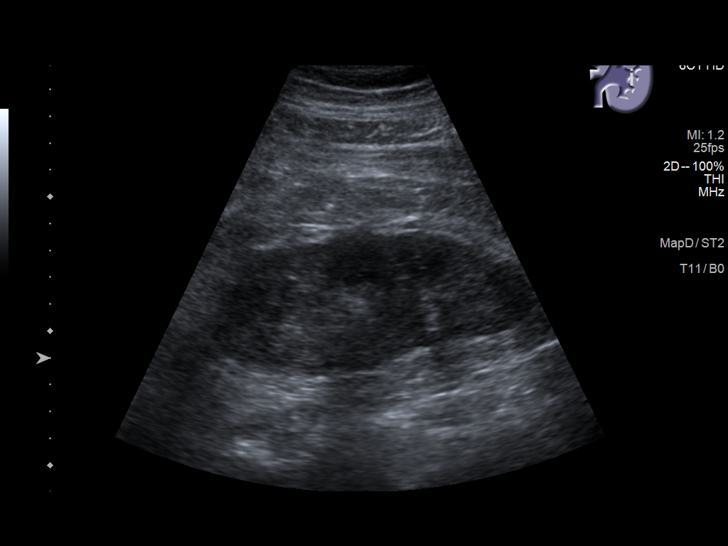
[im 29/43]
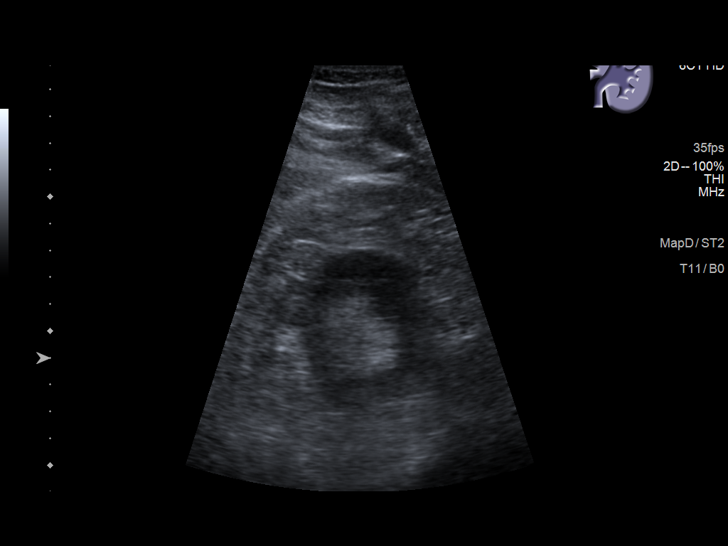
[im 32/43]
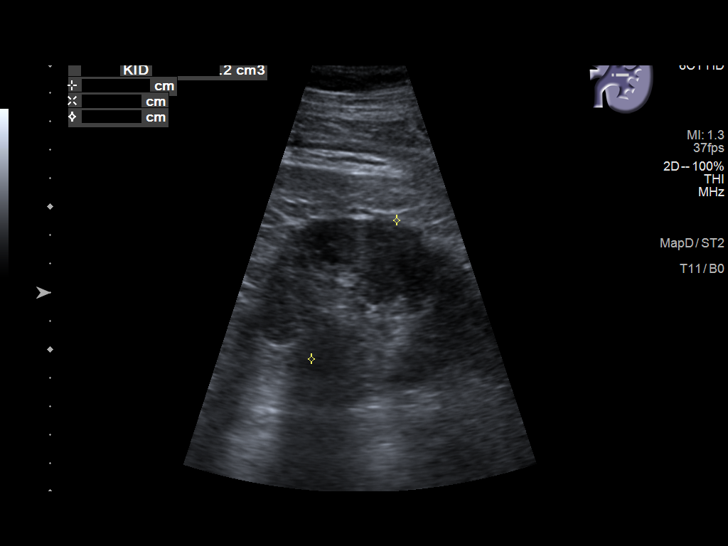
[im 36/43]
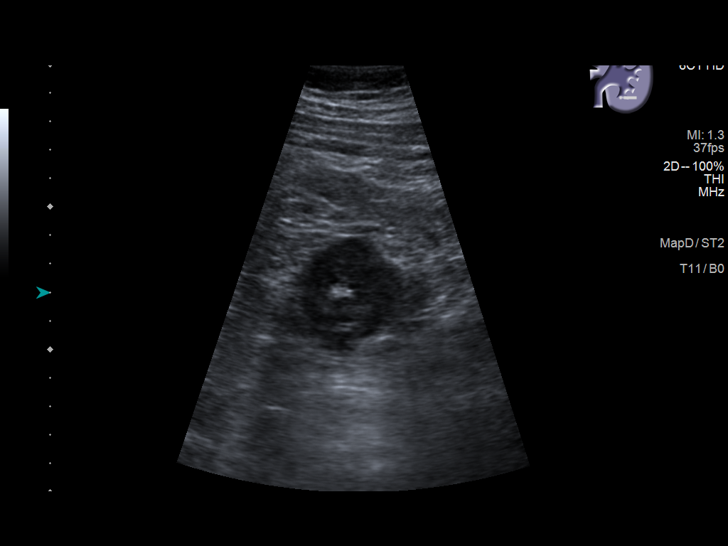
[im 39/43]
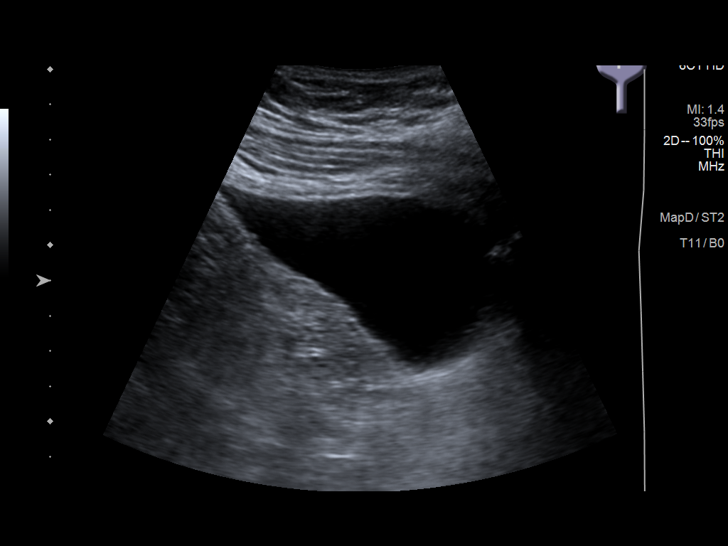
[im 43/43]
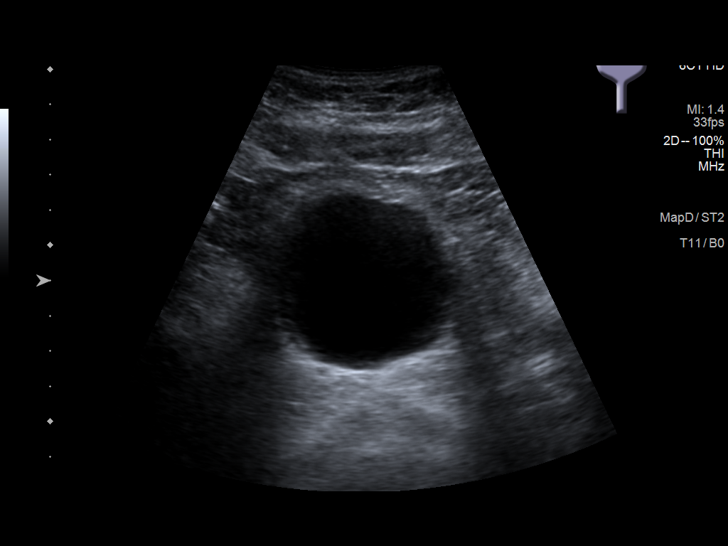

[14 of 25 positions shown; findings below may reference images not displayed]

FINDINGS: Right Kidney:

Renal measurements: 12.7 x 6.6 x 6.7 cm. = volume: 297 mL.
Nonobstructing calculus is noted in the lower pole measuring 5 mm.
No obstructive changes are seen.

Left Kidney:

Renal measurements: 12.8 x 6.0 x 5.7 cm. = volume: 227 mL.
Echogenicity within normal limits. No mass or hydronephrosis
visualized.

Bladder:

Appears normal for degree of bladder distention.

Other:

None.
IMPRESSION: 5 mm right lower pole renal stone. No obstructive changes are noted.

## 2021-09-08 ENCOUNTER — Ambulatory Visit (HOSPITAL_COMMUNITY)
Admission: RE | Admit: 2021-09-08 | Discharge: 2021-09-08 | Disposition: A | Discharge status: Home / Self Care | Payer: Medicare Other | Source: Ambulatory Visit | Attending: Student | Admitting: Student

## 2021-09-08 ENCOUNTER — Ambulatory Visit (HOSPITAL_COMMUNITY)
Admission: RE | Admit: 2021-09-08 | Discharge: 2021-09-08 | Disposition: A | Discharge status: Home / Self Care | Payer: Medicare Other | Source: Ambulatory Visit | Attending: Otolaryngology | Admitting: Otolaryngology

## 2021-09-08 ENCOUNTER — Other Ambulatory Visit: Payer: Self-pay

## 2021-09-08 DIAGNOSIS — H903 Sensorineural hearing loss, bilateral: Secondary | ICD-10-CM | POA: Diagnosis present

## 2021-09-08 DIAGNOSIS — Z950 Presence of cardiac pacemaker: Secondary | ICD-10-CM | POA: Insufficient documentation

## 2021-09-08 MED ORDER — GADOBUTROL 1 MMOL/ML IV SOLN
10.0000 mL | Freq: Once | INTRAVENOUS | Status: AC | PRN
  Administered 2021-09-08: 10 mL via INTRAVENOUS

## 2021-09-08 NOTE — Progress Notes (Signed)
Per order, Low pacing percentage. Program device to [place] mode off for MRI.   Tachy therapies off if applicable   Will program device back to pre-MRI settings after completion of exam

## 2021-09-08 NOTE — Progress Notes (Signed)
Informed of MRI for today.   Device system confirmed to be MRI conditional, with implant date > 6 weeks ago, and no evidence of abandoned or epicardial leads in review of most recent CXR Interrogation from today reviewed by [employer] Rep. Low pacing percentage. Loletha Grayer mode off for MRI.   Program device back to pre-MRI settings after completion of exam.  Annamaria Helling  09/08/2021 12:44 PM

## 2021-09-30 ENCOUNTER — Other Ambulatory Visit: Payer: Self-pay | Admitting: *Deleted

## 2021-09-30 DIAGNOSIS — N401 Enlarged prostate with lower urinary tract symptoms: Secondary | ICD-10-CM

## 2021-10-01 ENCOUNTER — Other Ambulatory Visit: Payer: Medicare Other

## 2021-10-01 ENCOUNTER — Other Ambulatory Visit: Payer: Self-pay

## 2021-10-01 DIAGNOSIS — N401 Enlarged prostate with lower urinary tract symptoms: Secondary | ICD-10-CM

## 2021-10-02 LAB — PSA: Prostate Specific Ag, Serum: 1.1 ng/mL (ref 0.0–4.0)

## 2021-10-06 LAB — BASIC METABOLIC PANEL
BUN: 19 (ref 4–21)
CO2: 29 — AB (ref 13–22)
Chloride: 99 (ref 99–108)
Creatinine: 1.1 (ref 0.6–1.3)
Glucose: 157
Potassium: 3.8 (ref 3.4–5.3)
Sodium: 142 (ref 137–147)

## 2021-10-06 LAB — LIPID PANEL
Cholesterol: 118 (ref 0–200)
HDL: 32 — AB (ref 35–70)
LDL Cholesterol: 45
Triglycerides: 238 — AB (ref 40–160)

## 2021-10-06 LAB — COMPREHENSIVE METABOLIC PANEL
Albumin: 4.4 (ref 3.5–5.0)
Calcium: 9.7 (ref 8.7–10.7)
GFR calc non Af Amer: 74

## 2021-10-06 LAB — HEPATIC FUNCTION PANEL
ALT: 30 (ref 10–40)
AST: 29 (ref 14–40)
Alkaline Phosphatase: 56 (ref 25–125)
Bilirubin, Total: 1.2

## 2021-10-06 LAB — CBC AND DIFFERENTIAL
HCT: 43 (ref 41–53)
Hemoglobin: 14.5 (ref 13.5–17.5)
Platelets: 150 (ref 150–399)
WBC: 8.2

## 2021-10-06 LAB — MICROALBUMIN / CREATININE URINE RATIO: Microalb Creat Ratio: 17

## 2021-10-06 LAB — CBC: RBC: 4.86 (ref 3.87–5.11)

## 2021-10-06 LAB — HEMOGLOBIN A1C: Hemoglobin A1C: 6.9

## 2021-10-07 NOTE — Progress Notes (Addendum)
10/08/2021 9:28 AM   Marissa Nestle Colarusso [birth year] [id number]  CC: Follow up  Urological history 1. Nephrolithiasis - remote history of stones in the 1980's - CTU 03/2019 a 9 mm nonobstructing right renal stone - ESWL 05/2019 - Stone composition calcium oxalate monohydrate - RUS 10/2019 5 mm right lower pole renal stone. No obstructive changes are noted - KUB 09/2020 no stones seen   2. High risk hematuria - Former smoker - CTU 03/2019 Nonobstructing RIGHT renal calculus. No ureterolithiasis or obstructive uropathy.  No enhancing renal lesion.  No bladder stones or filling defects in the bladder which does not excluded a bladder lesion - Cysto 03/2019 enlarged prostate with friable prostatic mucosa, bleeding with endoscopic manipulation -no reports of gross heme - UA negative for microscopic hematuria  3. BPH with LU TS - PSA 1.1 in 09/2021 - I PSS 6/1   HPI: Robert Grant is a 66 y.o. male who presents today for yearly visit.    He has not had any issues with renal colic or passage of fragments.   He has no urinary complaints at this visit. Patient denies any modifying or aggravating factors.  Patient denies any gross hematuria, dysuria or suprapubic/flank pain.  Patient denies any fevers, chills, nausea or vomiting.     IPSS     Row Name 10/08/21 0900         International Prostate Symptom Score   How often have you had the sensation of not emptying your bladder? Less than 1 in 5     How often have you had to urinate less than every two hours? Less than 1 in 5 times     How often have you found you stopped and started again several times when you urinated? Less than 1 in 5 times     How often have you found it difficult to postpone urination? Less than 1 in 5 times     How often have you had a weak urinary stream? Less than 1 in 5 times     How often have you had to strain to start urination? Not at All     How many times did you typically get up at night to  urinate? 1 Time     Total IPSS Score 6       Quality of Life due to urinary symptoms   If you were to spend the rest of your life with your urinary condition just the way it is now how would you feel about that? Pleased               Score:  1-7 Mild 8-19 Moderate 20-35 Severe     PMH: Past Medical History:  Diagnosis Date   A-fib ([place])    AICD (automatic cardioverter/defibrillator) present 07/2015   AF, CHF   CHF (congestive heart failure) (HCC)    Dysrhythmia    Afib   Family history of adverse reaction to anesthesia    Heart failure ([place])    History of kidney stones    Hyperlipemia    Hypertension    Sleep apnea    uses cpap    Surgical History: Past Surgical History:  Procedure Laterality Date   APPENDECTOMY  2012   COLECTOMY  07/2011   EXTRACORPOREAL SHOCK WAVE LITHOTRIPSY Right 05/17/2019   Procedure: EXTRACORPOREAL SHOCK WAVE LITHOTRIPSY (ESWL);  Surgeon: Hollice Espy, MD;  Location: [facility] ORS;  Service: Urology;  Laterality: Right;   Implant of defibrillator Left 08-22-15  KNEE ARTHROSCOPY Left 10/2018   torn meniscus,  workers comp   KNEE SURGERY Right 2006   TONSILLECTOMY AND ADENOIDECTOMY  1960    Home Medications:  Allergies as of 10/08/2021       Reactions   Niacin Hives   Other reaction(s): Other (See Comments) Hot flashes   Spironolactone Other (See Comments)   gynecomastia        Medication List        Accurate as of October 08, 2021  9:28 AM. If you have any questions, ask your nurse or doctor.          atorvastatin 40 MG tablet Commonly known as: LIPITOR TAKE 1 TABLET (40 MG TOTAL) BY MOUTH ONCE DAILY. What changed: Another medication with the same name was removed. Continue taking this medication, and follow the directions you see here. Changed by: Zara Council, PA-C   carvedilol 25 MG tablet Commonly known as: COREG Take 25 mg by mouth 2 (two) times daily.   dapagliflozin propanediol 10 MG Tabs  tablet Commonly known as: Farxiga TAKE 1 TABLET (10 MG TOTAL) BY MOUTH DAILY. PLEASE SCHEDULE AN OFFICE VISIT BEFORE ANYMORE REFILLS.   eplerenone 25 MG tablet Commonly known as: INSPRA Take 25 mg by mouth 2 (two) times daily.   lisinopril 10 MG tablet Commonly known as: ZESTRIL Take by mouth. What changed: Another medication with the same name was removed. Continue taking this medication, and follow the directions you see here. Changed by: Zara Council, PA-C   rivaroxaban 20 MG Tabs tablet Commonly known as: XARELTO Take 20 mg by mouth daily with supper.   torsemide 20 MG tablet Commonly known as: DEMADEX TAKE 1 TABLETS BY MOUTH ONCE DAILY PRN weight gain >230       Allergies:  Allergies  Allergen Reactions   Niacin Hives    Other reaction(s): Other (See Comments) Hot flashes   Spironolactone Other (See Comments)    gynecomastia   Family History: Family History  Problem Relation Age of Onset   Diabetes Mother    Hypertension Mother    Kidney disease Mother    Hyperlipidemia Mother    Lung cancer Father    Prostate cancer Father    Congestive Heart Failure Maternal Grandmother    Congestive Heart Failure Paternal Grandmother    Social History:  reports that he quit smoking about 39 years ago. His smoking use included cigarettes. He has a 22.00 pack-year smoking history. He has never used smokeless tobacco. He reports that he does not drink alcohol and does not use drugs.  For pertinent review of systems please refer to history of present illness  Physical Exam: BP (!) 157/93    Pulse 66    Ht 5' 9"  (1.753 m)    Wt 203 lb (92.1 kg)    BMI 29.98 kg/m   Constitutional:  Well nourished. Alert and oriented, No acute distress. HEENT: [place] AT, mask in place.  Trachea midline Cardiovascular: No clubbing, cyanosis, or edema. Respiratory: Normal respiratory effort, no increased work of breathing. GU: No CVA tenderness.  No bladder fullness or masses.  Patient with  circumcised phallus.  Urethral meatus is patent.  No penile discharge. No penile lesions or rashes. Scrotum without lesions, cysts, rashes and/or edema.  Testicles are located scrotally bilaterally. No masses are appreciated in the testicles. Left and right epididymis are normal. Rectal: Patient with  normal sphincter tone. Anus and perineum without scarring or rashes. No rectal masses are appreciated. Prostate is approximately  50 grams, no nodules are appreciated. Seminal vesicles could not be palpated  Neurologic: Grossly intact, no focal deficits, moving all 4 extremities. Psychiatric: Normal mood and affect.   Laboratory Data: Sodium 135 - 145 mmol/L 142   Potassium 3.5 - 5.0 mmol/L 3.8   Chloride 98 - 108 mmol/L 99   Carbon Dioxide (CO2) 21 - 30 mmol/L 29   Urea Nitrogen (BUN) 7 - 20 mg/dL 19   Creatinine 0.6 - 1.3 mg/dL 1.1   Glucose 70 - 140 mg/dL 157 High    Comment: Interpretive Data:  Above is the NONFASTING reference range.   Below are the FASTING reference ranges:  NORMAL:      70-99 mg/dL  PREDIABETES: 100-125 mg/dL  DIABETES:    > 125 mg/dL   Calcium 8.7 - 10.2 mg/dL 9.7   AST (Aspartate Aminotransferase) 15 - 41 U/L 29   ALT (Alanine Aminotransferase) 17 - 63 U/L 30   Bilirubin, Total 0.4 - 1.5 mg/dL 1.2   Alk Phos (Alkaline Phosphatase) 24 - 110 U/L 56   Albumin 3.5 - 4.8 g/dL 4.4   Protein, Total 6.2 - 8.1 g/dL 7.1   Anion Gap 3 - 12 mmol/L 14 High    BUN/CREA Ratio 6 - 27 17   Glomerular Filtration Rate (eGFR)  mL/min/1.73sq m 74   Comment: CKD-EPI (2021) does not include patient's race in the calculation of eGFR. Monitoring changes of plasma creatinine and eGFR over time is useful for monitoring kidney function.  This change was made on 10/28/2020.   Interpretive Ranges for eGFR(CKD-EPI 2021):   eGFR:              > 60 mL/min/1.73 sq m - Normal  eGFR:              30 - 59 mL/min/1.73 sq m - Moderately Decreased  eGFR:              15 - 29 mL/min/1.73 sq m -  Severely Decreased  eGFR:              < 15 mL/min/1.73 sq m -  Kidney Failure    Note: These eGFR calculations do not apply in acute situations  when eGFR is changing rapidly or in patients on dialysis.   Resulting Agency  [facility]  Specimen Collected: 10/06/21 08:37 Last Resulted: 10/06/21 09:49  Received From: [facility]  Result Received: 10/06/21 15:09   Cholesterol, Total mg/dL 118   Comment: The significance of total cholesterol depends on the values of individual components including HDL, LDL, non-HDL, and triglycerides.  LDL Direct <190 mg/dL 45   Comment:   <70 mg/dL      Desired target for prior heart disease, stroke, and those at high-risk. Even lower levels may be recommended to decrease risk of heart attack and stroke.  70-159 mg/dL   Comprehensive cardiovascular risk assessment is recommended. Statin therapy may be advised based on risk factors.  160-189 mg/dL  Moderately elevated LDL level. Statin therapy recommended if other risk factors present.  >=190 mg/dL    Severely elevated LDL level. High long-term risk of heart disease and stroke. High-intensity statin therapy recommended for most people. Consider specialist referral.  *A healthy diet and exercise are recommended for all to reduce heart disease risk. Statin choice should be based on patient preference after patient-provider discussions.     Ref: 2018 ACC/AHA Guideline  HDL mg/dL 32   Comment:   People with  low HDL levels (see below) are at increased risk of heart disease:  <50 mg/dL for Women  <40 mg/dL for Men  Triglyceride <500 mg/dL 238   Comment:   <150 mg/dL      Normal  150-499 mg/dL   High Triglycerides. Risk of heart disease may be increased. Address reversible causes (eg sugar in foods and beverages, alcohol, and diabetes control). Medication may be appropriate based on other clinical factors.  >=500 mg/dL     Very High Triglycerides. Risk of heart disease and  pancreatitis increased. Address reversible causes as above. Medication to lower triglycerides usually advised.    *Ranges provided for adults, pediatric guidelines vary.  Resulting Agency  [facility]  Specimen Collected: 10/06/21 08:37 Last Resulted: 10/06/21 09:49  Received From: [facility]  Result Received: 10/06/21 15:09   WBC (White Blood Cell Count) 3.2 - 9.8 x109/L 8.2   Hemoglobin 13.7 - 17.3 g/dL 14.5   Hematocrit 39.0 - 49.0 % 42.6   Platelets 150 - 450 x109/L 150   MCV (Mean Corpuscular Volume) 80 - 98 fL 88   MCH (Mean Corpuscular Hemoglobin) 26.5 - 34.0 pg 29.8   MCHC (Mean Corpuscular Hemoglobin Concentration) 31.5 - 36.3 % 34.0   RBC (Red Blood Cell Count) 4.37 - 5.74 x1012/L 4.86   RDW-CV (Red Cell Distribution Width) 11.5 - 14.5 % 13.4   NRBC (Nucleated Red Blood Cell Count) 0 x109/L 0.00   NRBC % (Nucleated Red Blood Cell %) % 0.0   MPV (Mean Platelet Volume) 7.2 - 11.7 fL 12.0 High    Resulting Agency  [facility]  Specimen Collected: 10/06/21 08:37 Last Resulted: 10/06/21 08:52  Received From: [place] [facility]  Result Received: 10/06/21 15:09   Hemoglobin A1C <6.5 % 6.9 High    Average Blood Glucose (Calculated From HgBA1c Level) mg/dL 153   Resulting Agency  [facility]  Narrative Performed by [place] The ADA has made the following Recommendations:  HBA1C results between 5.7% and 6.4% are suggestive of prediabetes  HBA1C result greater than or equal to 6.5% are diagnostic of diabetes Specimen Collected: 10/06/21 08:37 Last Resulted: 10/06/21 09:15  Received From: [place] [facility]  Result Received: 10/06/21 15:09   Urinalysis  Component     Latest Ref Rng & Units 10/08/2021  Specific Gravity, UA     1.005 - 1.030 >1.030 (H)  pH, UA     5.0 - 7.5 5.5  Color, UA     Yellow Yellow  Appearance Ur     Clear Clear   Leukocytes,UA     Negative Negative  Protein,UA     Negative/Trace Negative  Glucose, UA     Negative 1+ (A)  Ketones, UA     Negative Negative  RBC, UA     Negative Negative  Bilirubin, UA     Negative Negative  Urobilinogen, Ur     0.2 - 1.0 mg/dL 0.2  Nitrite, UA     Negative Negative  Microscopic Examination      See below:   Component     Latest Ref Rng & Units 10/08/2021  WBC, UA     0 - 5 /hpf 0-5  RBC     0 - 2 /hpf 0-2  Epithelial Cells (non renal)     0 - 10 /hpf 0-10  Bacteria, UA     None seen/Few None seen  I have reviewed the labs.  Pertinent Imaging: N/A  Assessment & Plan:    1. BPH with LUTS -PSA stable -DRE benign -continue conservative management, avoiding bladder irritants and timed voiding's  2. High risk hematuria -work up 2020 - NED -no reports of gross heme -UA negative for micro heme  Return in about 1 year (around 10/08/2022) for IPSS, PSA, UA  and exam.  Zara Council, [facility]  [place] [address] [place], [place] [address] [phone]

## 2021-10-08 ENCOUNTER — Ambulatory Visit (INDEPENDENT_AMBULATORY_CARE_PROVIDER_SITE_OTHER): Payer: Medicare Other | Admitting: Urology

## 2021-10-08 ENCOUNTER — Encounter: Payer: Self-pay | Admitting: Urology

## 2021-10-08 ENCOUNTER — Other Ambulatory Visit: Payer: Self-pay

## 2021-10-08 ENCOUNTER — Encounter: Payer: Self-pay | Admitting: Family Medicine

## 2021-10-08 VITALS — BP 157/93 | HR 66 | Ht 69.0 in | Wt 203.0 lb

## 2021-10-08 DIAGNOSIS — R319 Hematuria, unspecified: Secondary | ICD-10-CM | POA: Diagnosis not present

## 2021-10-08 DIAGNOSIS — N401 Enlarged prostate with lower urinary tract symptoms: Secondary | ICD-10-CM | POA: Diagnosis not present

## 2021-10-08 LAB — MICROSCOPIC EXAMINATION: Bacteria, UA: NONE SEEN

## 2021-10-08 LAB — URINALYSIS, COMPLETE
Bilirubin, UA: NEGATIVE
Ketones, UA: NEGATIVE
Leukocytes,UA: NEGATIVE
Nitrite, UA: NEGATIVE
Protein,UA: NEGATIVE
RBC, UA: NEGATIVE
Specific Gravity, UA: >1.03 — ABNORMAL HIGH (ref 1.005–1.030)
Urobilinogen, Ur: 0.2 mg/dL (ref 0.2–1.0)
pH, UA: 5.5 (ref 5.0–7.5)

## 2021-10-20 ENCOUNTER — Telehealth: Payer: Self-pay | Admitting: Family Medicine

## 2021-10-20 NOTE — Telephone Encounter (Signed)
Copied from [place] [id number]. Topic: Medicare AWV >> Oct 20, 2021  3:03 PM Cher Nakai R wrote: Reason for CRM:  Left message for patient to call back and schedule Medicare Annual Wellness Visit (AWV) in office.   If not able to come in the office, please offer to do virtually or by telephone.   No hx of AWV - AWV-I eligible per [employer] as of 02/28/2020  Please schedule at anytime with [facility] Health Advisor.   40 minute appointment  Any questions, please contact me at [phone]

## 2022-01-14 ENCOUNTER — Telehealth: Payer: Self-pay

## 2022-01-14 ENCOUNTER — Ambulatory Visit (INDEPENDENT_AMBULATORY_CARE_PROVIDER_SITE_OTHER): Payer: Medicare Other | Admitting: Physician Assistant

## 2022-01-14 ENCOUNTER — Other Ambulatory Visit: Payer: Self-pay

## 2022-01-14 ENCOUNTER — Encounter: Payer: Self-pay | Admitting: Physician Assistant

## 2022-01-14 VITALS — BP 134/82 | HR 67 | Ht 69.0 in | Wt 217.8 lb

## 2022-01-14 DIAGNOSIS — H9191 Unspecified hearing loss, right ear: Secondary | ICD-10-CM | POA: Insufficient documentation

## 2022-01-14 DIAGNOSIS — E118 Type 2 diabetes mellitus with unspecified complications: Secondary | ICD-10-CM

## 2022-01-14 DIAGNOSIS — I5042 Chronic combined systolic (congestive) and diastolic (congestive) heart failure: Secondary | ICD-10-CM | POA: Diagnosis not present

## 2022-01-14 DIAGNOSIS — Z1211 Encounter for screening for malignant neoplasm of colon: Secondary | ICD-10-CM

## 2022-01-14 DIAGNOSIS — E785 Hyperlipidemia, unspecified: Secondary | ICD-10-CM

## 2022-01-14 DIAGNOSIS — Z0000 Encounter for general adult medical examination without abnormal findings: Secondary | ICD-10-CM | POA: Diagnosis not present

## 2022-01-14 DIAGNOSIS — I10 Essential (primary) hypertension: Secondary | ICD-10-CM | POA: Diagnosis not present

## 2022-01-14 DIAGNOSIS — Z95810 Presence of automatic (implantable) cardiac defibrillator: Secondary | ICD-10-CM

## 2022-01-14 HISTORY — DX: Type 2 diabetes mellitus with unspecified complications: E11.8

## 2022-01-14 MED ORDER — DAPAGLIFLOZIN PROPANEDIOL 10 MG PO TABS: 10.0000 mg | ORAL_TABLET | Freq: Every day | ORAL | 1 refills | Status: AC

## 2022-01-14 NOTE — Telephone Encounter (Signed)
CALLED PATIENT NO ANSWER LEFT VOICEMAIL FOR A CALL BACK ? ?

## 2022-01-14 NOTE — Assessment & Plan Note (Signed)
Reviewed last lipid profile LDL at goal Managed with atorvastatin 40 mg

## 2022-01-14 NOTE — Assessment & Plan Note (Addendum)
Per pt managed mainly by cardiology. Takes farxiga 10 mg daily.  Pt declines POC A1c today as cardiology manages; last was 6.9% 2/23 Does not see optho regularly. Foot exam completed today uacr obtained today. On statin and ace-i

## 2022-01-14 NOTE — Progress Notes (Signed)
Patient will come by so we can scan his insurance and make appointment

## 2022-01-14 NOTE — Progress Notes (Signed)
I,Sha'taria Tyson,acting as a Education administrator for [employer], PA-C.,have documented all relevant documentation on the behalf of Robert Kirschner, PA-C,as directed by  Robert Kirschner, PA-C while in the presence of Robert Kirschner, PA-C.  Annual Wellness Visit     Patient: Robert Grant, Male    DOB: February 01, 1956, 66 y.o.   MRN: [id number] Visit Date: 01/14/2022  Today's Provider: [name], PA-C   Cc. awv  Subjective    Robert Grant is a 66 y.o. male who presents today for his Annual Wellness Visit. He reports consuming a general diet. The patient does not participate in regular exercise at present. He generally feels well. He reports sleeping well. He does not have additional problems to discuss today.   Reports fasting sugars at home range from 110-160.  Medications: Outpatient Medications Prior to Visit  Medication Sig   atorvastatin (LIPITOR) 40 MG tablet TAKE 1 TABLET (40 MG TOTAL) BY MOUTH ONCE DAILY.   carvedilol (COREG) 25 MG tablet Take 25 mg by mouth 2 (two) times daily.   eplerenone (INSPRA) 25 MG tablet Take 25 mg by mouth 2 (two) times daily.    FLOWFLEX COVID-19 AG HOME TEST KIT See admin instructions.   fluticasone (FLONASE) 50 MCG/ACT nasal spray Place 2 sprays into both nostrils daily.   lisinopril (ZESTRIL) 10 MG tablet Take by mouth.   rivaroxaban (XARELTO) 20 MG TABS tablet Take 20 mg by mouth daily with supper.   torsemide (DEMADEX) 20 MG tablet TAKE 1 TABLETS BY MOUTH ONCE DAILY PRN weight gain >230   [DISCONTINUED] dapagliflozin propanediol (FARXIGA) 10 MG TABS tablet TAKE 1 TABLET (10 MG TOTAL) BY MOUTH DAILY. PLEASE SCHEDULE AN OFFICE VISIT BEFORE ANYMORE REFILLS.   No facility-administered medications prior to visit.    Allergies  Allergen Reactions   Niacin Hives    Other reaction(s): Other (See Comments) Hot flashes   Spironolactone Other (See Comments)    gynecomastia    Patient Care Team: Robert Kirschner, PA-C as PCP - General (Physician  Assistant)  Review of Systems  Constitutional:  Negative for fatigue and fever.  Respiratory:  Negative for cough and shortness of breath.   Cardiovascular:  Negative for chest pain, palpitations and leg swelling.  Neurological:  Negative for dizziness and headaches.       Objective    Blood pressure 134/82, pulse 67, height 5' 9"  (1.753 m), weight 217 lb 12.8 oz (98.8 kg), SpO2 100 %.    Physical Exam Constitutional:      General: He is awake.     Appearance: He is well-developed.  HENT:     Head: Normocephalic.  Eyes:     Conjunctiva/sclera: Conjunctivae normal.  Cardiovascular:     Rate and Rhythm: Normal rate and regular rhythm.     Pulses:          Dorsalis pedis pulses are 3+ on the right side and 3+ on the left side.     Heart sounds: Normal heart sounds.  Pulmonary:     Effort: Pulmonary effort is normal.     Breath sounds: Normal breath sounds.  Musculoskeletal:     Right lower leg: No edema.     Left lower leg: No edema.  Feet:     Right foot:     Protective Sensation: 3 sites tested.  3 sites sensed.     Skin integrity: Skin integrity normal.     Toenail Condition: Right toenails are normal.     Left foot:  Protective Sensation: 3 sites tested.  3 sites sensed.     Skin integrity: Skin integrity normal.     Toenail Condition: Left toenails are normal.  Skin:    General: Skin is warm.  Neurological:     Mental Status: He is alert and oriented to person, place, and time.  Psychiatric:        Attention and Perception: Attention normal.        Mood and Affect: Mood normal.        Speech: Speech normal.        Behavior: Behavior is cooperative.   Most recent functional status assessment:    01/14/2022    9:59 AM  In your present state of health, do you have any difficulty performing the following activities:  Hearing? 1  Vision? 0  Difficulty concentrating or making decisions? 0  Walking or climbing stairs? 0  Dressing or bathing? 0  Doing  errands, shopping? 0   Most recent fall risk assessment:    01/14/2022    9:59 AM  Fall Risk   Falls in the past year? 0  Number falls in past yr: 0  Injury with Fall? 0  Risk for fall due to : No Fall Risks    Most recent depression screenings:    01/14/2022    9:59 AM 04/06/2021    1:11 PM  PHQ 2/9 Scores  PHQ - 2 Score 0 0  PHQ- 9 Score 0 1   Most recent cognitive screening:     View : No data to display.         Most recent Audit-C alcohol use screening    01/14/2022    9:59 AM  Alcohol Use Disorder Test (AUDIT)  1. How often do you have a drink containing alcohol? 0  2. How many drinks containing alcohol do you have on a typical day when you are drinking? 0  3. How often do you have six or more drinks on one occasion? 0  AUDIT-C Score 0   A score of 3 or more in women, and 4 or more in men indicates increased risk for alcohol abuse, EXCEPT if all of the points are from question 1   No results found for any visits on 01/14/22.  Assessment & Plan     Annual wellness visit done today including the all of the following: Reviewed patient's Family Medical History Reviewed and updated list of patient's medical providers Assessment of cognitive impairment was done Assessed patient's functional ability Established a written schedule for health screening [place] Completed and Reviewed  Exercise Activities and Dietary recommendations --balanced diet high in fiber and protein, low in sugars, carbs, fats. --physical activity/exercise 30 minutes 3-5 times a week    Immunization History  Administered Date(s) Administered   Influenza,inj,Quad PF,6+ Mos 06/26/2014, 05/27/2015, 06/08/2016, 06/10/2017, 05/23/2018   Influenza-Unspecified 05/30/2014, 05/15/2019   Pneumococcal Polysaccharide-23 05/27/2015   Tdap 02/13/2008   Zoster, Live 06/26/2014    Health Maintenance  Topic Date Due   OPHTHALMOLOGY EXAM  Never done   Zoster Vaccines- Shingrix (1  of 2) Never done   Pneumonia Vaccine 53+ Years old (2 - PCV) 05/26/2016   TETANUS/TDAP  02/12/2018   COLONOSCOPY (Pts 45-80yr Insurance coverage will need to be confirmed)  01/06/2021   COVID-19 Vaccine (2 - Janssen risk series) 01/30/2022 (Originally 03/30/2020)   INFLUENZA VACCINE  03/30/2022   HEMOGLOBIN A1C  04/05/2022   FOOT EXAM  01/15/2023   Hepatitis C  Screening  Completed   HPV VACCINES  Aged Out     Discussed health benefits of physical activity, and encouraged him to engage in regular exercise appropriate for his age and condition.    Problem List Items Addressed This Visit       Cardiovascular and Mediastinum   Essential (primary) hypertension    Managed by cardiology. Takes lisinopril 10 mg and carvedilol 25 mg bid.  Was elevated last cardio visit and todays visit but pt states in range at home.  Has f./u with cardio 01/28/2022       Chronic combined systolic and diastolic heart failure ([place])    Managed by cardiology asymptomatic today and euvolemic.          Endocrine   Controlled type 2 diabetes mellitus with complication, without long-term current use of insulin ([place])    Per pt managed mainly by cardiology. Takes farxiga 10 mg daily.  Pt declines POC A1c today as cardiology manages; last was 6.9% 2/23 Does not see optho regularly. Foot exam completed today uacr obtained today. On statin and ace-i       Relevant Medications   dapagliflozin propanediol (FARXIGA) 10 MG TABS tablet   Other Relevant Orders   Urine Microalbumin w/creat. ratio     Other   HLD (hyperlipidemia)    Reviewed last lipid profile LDL at goal Managed with atorvastatin 40 mg       Automatic implantable cardioverter-defibrillator in situ   Other Visit Diagnoses     Encounter for annual wellness visit (AWV) in Medicare patient    -  Primary   Colon cancer screening       Relevant Orders   Ambulatory referral to Gastroenterology       Return in about 6 months (around  07/17/2022) for DMII.     I, Robert Kirschner, PA-C have reviewed all documentation for this visit. The documentation on  01/14/2022 for the exam, diagnosis, procedures, and orders are all accurate and complete.  Robert Kirschner, PA-C [facility] [address] [place], [place], [address] Office: [phone] Fax: [place]

## 2022-01-14 NOTE — Assessment & Plan Note (Signed)
Managed by cardiology asymptomatic today and euvolemic.

## 2022-01-14 NOTE — Assessment & Plan Note (Addendum)
Managed by cardiology. Takes lisinopril 10 mg and carvedilol 25 mg bid.  Was elevated last cardio visit and todays visit but pt states in range at home.  Has f./u with cardio 01/28/2022

## 2022-01-15 LAB — MICROALBUMIN / CREATININE URINE RATIO
Creatinine, Urine: 62.5 mg/dL
Microalb/Creat Ratio: 28 mg/g creat (ref 0–29)
Microalbumin, Urine: 17.8 ug/mL

## 2022-01-18 ENCOUNTER — Other Ambulatory Visit: Payer: Self-pay

## 2022-01-18 ENCOUNTER — Telehealth: Payer: Self-pay

## 2022-01-18 DIAGNOSIS — Z1211 Encounter for screening for malignant neoplasm of colon: Secondary | ICD-10-CM

## 2022-01-18 MED ORDER — NA SULFATE-K SULFATE-MG SULF 17.5-3.13-1.6 GM/177ML PO SOLN: 1.0000 | Freq: Once | ORAL | 0 refills | Status: AC

## 2022-01-18 NOTE — Telephone Encounter (Signed)
Gastroenterology Pre-Procedure Review  Request Date: 02/11/22 Requesting Physician: Dr. Vicente Males  PATIENT REVIEW QUESTIONS: The patient responded to the following health history questions as indicated:    1. Are you having any GI issues? no Pt stated he had colon resection in 2013 2. Do you have a personal history of Polyps? no 3. Do you have a family history of Colon Cancer or Polyps? no 4. Diabetes Mellitus? yes (yes type 2) 5. Joint replacements in the past 12 months?no 6. Major health problems in the past 3 months?no 7. Any artificial heart valves, MVP, or defibrillator?yes pt has defibrillator monitored by [facility] Remotely and routine visits Cardiac Request will be sent to [place]:    Patient reports the following regarding taking any anticoagulation/antiplatelet therapy:   Plavix, Coumadin, Eliquis, Xarelto, Lovenox, Pradaxa, Brilinta, or Effient? yes (Xarelto 62m) [facility] Aspirin? no  Patient confirms/reports the following medications:  Current Outpatient Medications  Medication Sig Dispense Refill   atorvastatin (LIPITOR) 40 MG tablet TAKE 1 TABLET (40 MG TOTAL) BY MOUTH ONCE DAILY.  11   carvedilol (COREG) 25 MG tablet Take 25 mg by mouth 2 (two) times daily.  3   dapagliflozin propanediol (FARXIGA) 10 MG TABS tablet Take 1 tablet (10 mg total) by mouth daily. 90 tablet 1   eplerenone (INSPRA) 25 MG tablet Take 25 mg by mouth 2 (two) times daily.      FLOWFLEX COVID-19 AG HOME TEST KIT See admin instructions.     fluticasone (FLONASE) 50 MCG/ACT nasal spray Place 2 sprays into both nostrils daily.     lisinopril (ZESTRIL) 10 MG tablet Take by mouth.     rivaroxaban (XARELTO) 20 MG TABS tablet Take 20 mg by mouth daily with supper.     torsemide (DEMADEX) 20 MG tablet TAKE 1 TABLETS BY MOUTH ONCE DAILY PRN weight gain >230  11   No current facility-administered medications for this visit.     Patient confirms/reports the following allergies:  Allergies  Allergen Reactions   Niacin Hives    Other reaction(s): Other (See Comments) Hot flashes   Spironolactone Other (See Comments)    gynecomastia    No orders of the defined types were placed in this encounter.   AUTHORIZATION INFORMATION Primary Insurance: 1D#: Group #:  Secondary Insurance: 1D#: Group #:  SCHEDULE INFORMATION: Date: 02/11/22 Time: Location: [place]

## 2022-02-02 ENCOUNTER — Telehealth: Payer: Self-pay

## 2022-02-02 NOTE — Telephone Encounter (Signed)
Contacted patient to see if he has been advised regarding Xarelto prior to scheduled colonoscopy.  He stated that his cardiologist Dr. Loanne Drilling advised him to hold the Xarelto 2-3 days prior to colonoscopy. I told him to hold it 3 days prior.  Thanks, [employer]

## 2022-02-11 ENCOUNTER — Ambulatory Visit: Payer: Medicare Other | Admitting: Anesthesiology

## 2022-02-11 ENCOUNTER — Encounter: Payer: Self-pay | Admitting: Gastroenterology

## 2022-02-11 ENCOUNTER — Encounter
Admission: RE | Disposition: A | Discharge status: Home / Self Care | Payer: Self-pay | Source: Home / Self Care | Attending: Gastroenterology

## 2022-02-11 ENCOUNTER — Ambulatory Visit
Admission: RE | Admit: 2022-02-11 | Discharge: 2022-02-11 | Disposition: A | Discharge status: Home / Self Care | Payer: Medicare Other | Attending: Gastroenterology | Admitting: Gastroenterology

## 2022-02-11 DIAGNOSIS — Z87891 Personal history of nicotine dependence: Secondary | ICD-10-CM | POA: Diagnosis not present

## 2022-02-11 DIAGNOSIS — E119 Type 2 diabetes mellitus without complications: Secondary | ICD-10-CM | POA: Diagnosis not present

## 2022-02-11 DIAGNOSIS — Z1211 Encounter for screening for malignant neoplasm of colon: Secondary | ICD-10-CM | POA: Insufficient documentation

## 2022-02-11 DIAGNOSIS — I5022 Chronic systolic (congestive) heart failure: Secondary | ICD-10-CM | POA: Insufficient documentation

## 2022-02-11 DIAGNOSIS — I110 Hypertensive heart disease with heart failure: Secondary | ICD-10-CM | POA: Insufficient documentation

## 2022-02-11 DIAGNOSIS — Z86010 Personal history of colonic polyps: Secondary | ICD-10-CM | POA: Diagnosis not present

## 2022-02-11 DIAGNOSIS — G4733 Obstructive sleep apnea (adult) (pediatric): Secondary | ICD-10-CM | POA: Diagnosis not present

## 2022-02-11 DIAGNOSIS — D126 Benign neoplasm of colon, unspecified: Secondary | ICD-10-CM | POA: Diagnosis not present

## 2022-02-11 DIAGNOSIS — I4891 Unspecified atrial fibrillation: Secondary | ICD-10-CM | POA: Diagnosis not present

## 2022-02-11 DIAGNOSIS — Z980 Intestinal bypass and anastomosis status: Secondary | ICD-10-CM | POA: Insufficient documentation

## 2022-02-11 DIAGNOSIS — Z95810 Presence of automatic (implantable) cardiac defibrillator: Secondary | ICD-10-CM | POA: Insufficient documentation

## 2022-02-11 DIAGNOSIS — Z7901 Long term (current) use of anticoagulants: Secondary | ICD-10-CM | POA: Diagnosis not present

## 2022-02-11 HISTORY — PX: COLONOSCOPY WITH PROPOFOL: SHX5780

## 2022-02-11 LAB — GLUCOSE, CAPILLARY: Glucose-Capillary: 171 mg/dL — ABNORMAL HIGH (ref 70–99)

## 2022-02-11 SURGERY — COLONOSCOPY WITH PROPOFOL
Anesthesia: General

## 2022-02-11 MED ORDER — SODIUM CHLORIDE 0.9 % IV SOLN: INTRAVENOUS | Status: DC

## 2022-02-11 MED ORDER — PROPOFOL 10 MG/ML IV BOLUS
INTRAVENOUS | Status: DC | PRN
  Administered 2022-02-11: 90 mg via INTRAVENOUS

## 2022-02-11 MED ORDER — STERILE WATER FOR IRRIGATION IR SOLN
Status: DC | PRN
  Administered 2022-02-11: 60 mL

## 2022-02-11 MED ORDER — LIDOCAINE HCL (CARDIAC) PF 100 MG/5ML IV SOSY
PREFILLED_SYRINGE | INTRAVENOUS | Status: DC | PRN
  Administered 2022-02-11: 40 mg via INTRAVENOUS

## 2022-02-11 MED ORDER — PROPOFOL 500 MG/50ML IV EMUL
INTRAVENOUS | Status: DC | PRN
  Administered 2022-02-11: 150 ug/kg/min via INTRAVENOUS

## 2022-02-11 NOTE — Transfer of Care (Signed)
Immediate Anesthesia Transfer of Care Note  Patient: Robert Grant  Procedure(s) Performed: COLONOSCOPY WITH PROPOFOL  Patient Location: PACU  Anesthesia Type:General  Level of Consciousness: awake and alert   Airway & Oxygen Therapy: Patient Spontanous Breathing  Post-op Assessment: Report given to RN and Post -op Vital signs reviewed and stable  Post vital signs: Reviewed and stable  Last Vitals:  Vitals Value Taken Time  BP 108/63 02/11/22 0945  Temp 36.3 C 02/11/22 0944  Pulse 79 02/11/22 0948  Resp 23 02/11/22 0948  SpO2 97 % 02/11/22 0948  Vitals shown include unvalidated device data.  Last Pain:  Vitals:   02/11/22 0944  TempSrc: Temporal  PainSc: 0-No pain         Complications: No notable events documented.

## 2022-02-11 NOTE — Op Note (Signed)
[facility] Gastroenterology Patient Name: Robert Grant Procedure Date: 02/11/2022 9:09 AM MRN: [id number] Account #: [id number] Date of Birth: Jan 26, 1956 Admit Type: Outpatient Age: 66 Room: [facility] ENDO ROOM 3 Gender: Male Note Status: Finalized Instrument Name: Jasper Riling [id number] Procedure:             Colonoscopy Indications:           Surveillance: Personal history of colonic polyps                         (unknown histology) on last colonoscopy more than 5                         years ago Providers:             Jonathon Bellows MD, MD Referring MD:          [name] Medicines:             Monitored Anesthesia Care Complications:         No immediate complications. Procedure:             Pre-Anesthesia Assessment:                        - Prior to the procedure, a History and Physical was                         performed, and patient medications, allergies and                         sensitivities were reviewed. The patient's tolerance                         of previous anesthesia was reviewed.                        - The risks and benefits of the procedure and the                         sedation options and risks were discussed with the                         patient. All questions were answered and informed                         consent was obtained.                        - ASA Grade Assessment: II - A patient with mild                         systemic disease.                        After obtaining informed consent, the colonoscope was                         passed under direct vision. Throughout the procedure,                         the patient's blood pressure, pulse, and oxygen  saturations were monitored continuously. The                         Colonoscope was introduced through the anus and                         advanced to the the ileocolonic anastomosis. The                         colonoscopy was performed with  ease. The patient                         tolerated the procedure well. The quality of the bowel                         preparation was adequate. Findings:      The perianal and digital rectal examinations were normal.      A 15 mm polyp was found in the anastomosis. The polyp was sessile.       Preparations were made for mucosal resection. Saline was injected to       raise the lesion. Snare mucosal resection was performed. Resection and       retrieval were complete. To prevent bleeding after mucosal resection,       one hemostatic clip was successfully placed. There was no bleeding       during, or at the end, of the procedure. Borders of polyp marked using       blue or nbi light      The exam was otherwise without abnormality on direct and retroflexion       views. Impression:            - One 15 mm polyp at the anastomosis, removed with                         mucosal resection. Resected and retrieved. Clip was                         placed.                        - The examination was otherwise normal on direct and                         retroflexion views.                        - Mucosal resection was performed. Resection and                         retrieval were complete. Recommendation:        - Discharge patient to home (with escort).                        - Resume previous diet.                        - Continue present medications.                        - Await pathology results.                        -  Repeat colonoscopy for surveillance based on                         pathology results. Procedure Code(s):     --- Professional ---                        [phone], Colonoscopy, flexible; with endoscopic mucosal                         resection CPT copyright 2019 American Medical Association. All rights reserved. The codes documented in this report are preliminary and upon coder review may  be revised to meet current compliance requirements. Jonathon Bellows, MD Jonathon Bellows MD, MD 02/11/2022 9:45:24 AM This report has been signed electronically. Number of Addenda: 0 Note Initiated On: 02/11/2022 9:09 AM Scope Withdrawal Time: 0 hours 18 minutes 8 seconds  Total Procedure Duration: 0 hours 19 minutes 35 seconds  Estimated Blood Loss:  Estimated blood loss: none.      [facility]

## 2022-02-11 NOTE — Anesthesia Preprocedure Evaluation (Addendum)
Anesthesia Evaluation  Patient identified by MRN, date of birth, ID band Patient awake    Reviewed: Allergy & Precautions, NPO status , Patient's Chart, lab work & pertinent test results  History of Anesthesia Complications Negative for: history of anesthetic complications  Airway Mallampati: IV   Neck ROM: Full    Dental  (+) Missing   Pulmonary sleep apnea and Continuous Positive Airway Pressure Ventilation , former smoker (quit 1984),    Pulmonary exam normal breath sounds clear to auscultation       Cardiovascular hypertension, +CHF (2/2 ICM)  Normal cardiovascular exam+ dysrhythmias (a fib on Xarelto) + Cardiac Defibrillator  Rhythm:Regular Rate:Normal  Echo 01/01/2020: MILD LV DYSFUNCTION (LVEF 45%) WITH MILD LVH NORMAL RIGHT VENTRICULAR SYSTOLIC FUNCTION VALVULAR REGURGITATION: TRIVIAL TR VALVULAR STENOSIS: MILD AS Compared with prior Echo study on 01/17/2019: NO SIGNIFICANT CHANGE.    Neuro/Psych HOH    GI/Hepatic negative GI ROS,   Endo/Other  diabetes, Type 2  Renal/GU Renal disease (nephrolithiasis)   BPH    Musculoskeletal   Abdominal   Peds  Hematology negative hematology ROS (+)   Anesthesia Other Findings Cardiology note 10/06/21:  A/P: 66 y.o. male who comes to clinic today for follow-up heart failure care.   1. Chronic systolic heart failure secondary to NICM- He endorses NYHA Class II symptoms. He has had a dramatic improvement in his symptoms with lifestyle modification and the addition of an SGLT2 inhibitor. His JVD is not distended and he appears otherwise euvolemic and well perfused on exam. He is on a regimen of carvedilol, lisinopril, Farxiga and eplerenone which he is tolerating quite well. He underwent a repeat resting echocardiogram in the setting of his worsening exercise tolerance from his last visit. His LV function is mildly diminished and appears largely stable from his prior study in  2020. His blood pressures are elevated in clinic. I had attempted to replace his lisinopril with Entresto. However, the price of Entresto was too high. Thus I have resumed his lisinopril for now. Plan to check a comprehensive metabolic panel and proBNP today.  2. Afib- on carvedilol for rate control and xarelto for embolic prophylaxis. He does not endorse symptoms suggestive of recurrent tachyarrhythmias.  3. HTN-his blood pressures are elevated in clinic. However, his blood pressures at home appear to be well controlled. I have asked him to check his blood pressure in the morning and evening about 1 hour after taking medications and to contact my office if his systolic blood pressures are greater than 140 mmHg.  4. HL-plan to recheck his lipids today.  5. OSA-he has moderate sleep apnea. He reports consistent usage of BiPAP, which I encouraged.  6. Follow-up-6 months    Reproductive/Obstetrics                            Anesthesia Physical Anesthesia Plan  ASA: 3  Anesthesia Plan: General   Post-op Pain Management:    Induction: Intravenous  PONV Risk Score and Plan: 2 and Propofol infusion, TIVA and Treatment may vary due to age or medical condition  Airway Management Planned: Natural Airway  Additional Equipment:   Intra-op Plan:   Post-operative Plan:   Informed Consent: I have reviewed the patients History and Physical, chart, labs and discussed the procedure including the risks, benefits and alternatives for the proposed anesthesia with the patient or authorized representative who has indicated his/her understanding and acceptance.       Plan Discussed  with: CRNA  Anesthesia Plan Comments: (LMA/GETA backup discussed.  Patient consented for risks of anesthesia including but not limited to:  - adverse reactions to medications - damage to eyes, teeth, lips or other oral mucosa - nerve damage due to positioning  - sore throat or hoarseness -  damage to heart, brain, nerves, lungs, other parts of body or loss of life  Informed patient about role of CRNA in peri- and intra-operative care.  Patient voiced understanding.)        Anesthesia Quick Evaluation

## 2022-02-11 NOTE — H&P (Signed)
Robert Bellows, MD [address], [place], [place], [place], [address] [address], [place], [place], [place], [address] Phone: [phone]  Fax: [phone]  Primary Care Physician:  Robert Kirschner, PA-C   Pre-Procedure History & Physical: HPI:  Robert Grant is a 66 y.o. male is here for an colonoscopy.   Past Medical History:  Diagnosis Date   A-fib [facility])    AICD (automatic cardioverter/defibrillator) present 07/2015   AF, CHF   CHF (congestive heart failure) ([place])    Controlled type 2 diabetes mellitus with complication, without long-term current use of insulin ([place]) 01/14/2022   Dysrhythmia    Afib   Family history of adverse reaction to anesthesia    Heart failure ([place])    History of kidney stones    Hyperlipemia    Hypertension    Sleep apnea    uses cpap    Past Surgical History:  Procedure Laterality Date   APPENDECTOMY  2012   COLECTOMY  07/2011   EXTRACORPOREAL SHOCK WAVE LITHOTRIPSY Right 05/17/2019   Procedure: EXTRACORPOREAL SHOCK WAVE LITHOTRIPSY (ESWL);  Surgeon: Hollice Espy, MD;  Location: [facility] ORS;  Service: Urology;  Laterality: Right;   Implant of defibrillator Left 08-22-15   KNEE ARTHROSCOPY Left 10/2018   torn meniscus,  workers comp   KNEE SURGERY Right 2006   [place]    Prior to Admission medications   Medication Sig Start Date End Date Taking? Authorizing Provider  carvedilol (COREG) 25 MG tablet Take 25 mg by mouth 2 (two) times daily. 05/16/18  Yes [provider]  dapagliflozin propanediol (FARXIGA) 10 MG TABS tablet Take 1 tablet (10 mg total) by mouth daily. 01/14/22  Yes Drubel, Ria Comment, PA-C  eplerenone (INSPRA) 25 MG tablet Take 25 mg by mouth 2 (two) times daily.  05/24/19  Yes [provider]  lisinopril (ZESTRIL) 10 MG tablet Take by mouth. 10/06/21  Yes [provider]  torsemide (DEMADEX) 20 MG tablet TAKE 1 TABLETS BY MOUTH ONCE DAILY PRN weight gain >230 06/14/15   Yes [provider]  atorvastatin (LIPITOR) 40 MG tablet TAKE 1 TABLET (40 MG TOTAL) BY MOUTH ONCE DAILY. 06/14/15   [provider]  [place] TEST KIT See admin instructions. 10/21/21   [provider]  fluticasone (FLONASE) 50 MCG/ACT nasal spray Place 2 sprays into both nostrils daily. 11/03/21   [provider]  rivaroxaban (XARELTO) 20 MG TABS tablet Take 20 mg by mouth daily with supper.    [provider]    Allergies as of 01/18/2022 - Review Complete 01/14/2022  Allergen Reaction Noted   Niacin Hives 04/02/2014   Spironolactone Other (See Comments) 05/17/2019    Family History  Problem Relation Age of Onset   Diabetes Mother    Hypertension Mother    Kidney disease Mother    Hyperlipidemia Mother    Lung cancer Father    Prostate cancer Father    Congestive Heart Failure Maternal Grandmother    Congestive Heart Failure Paternal Grandmother     Social History   Socioeconomic History   Marital status: Married    Spouse name: Robert Grant   Number of children: Not on file   Years of education: Not on file   Highest education level: Not on file  Occupational History   Occupation: out on workers comp d/t knee injury  Tobacco Use   Smoking status: Former    Packs/day: 2.00    Years:  11.00    Total pack years: 22.00    Types: Cigarettes    Quit date: 08/1982    Years since quitting: 39.4   Smokeless tobacco: Never  Vaping Use   Vaping Use: Never used  Substance and Sexual Activity   Alcohol use: No    Alcohol/week: 0.0 standard drinks of alcohol   Drug use: No   Sexual activity: Yes    Birth control/protection: None  Other Topics Concern   Not on file  Social History Narrative   Not on file   Social Determinants of Health   Financial Resource Strain: Not on file  Food Insecurity: Not on file  Transportation Needs: Not on file  Physical Activity: Not on file  Stress: Not on file  Social Connections:  Not on file  Intimate Partner Violence: Not on file    Review of Systems: See HPI, otherwise negative ROS  Physical Exam: BP (!) 178/121   Pulse 71   Temp (!) 96.8 F (36 C) (Temporal)   Resp 18   Ht 5' 9"  (1.753 m)   Wt 95.3 kg   SpO2 98%   BMI 31.01 kg/m  General:   Alert,  pleasant and cooperative in NAD Head:  Normocephalic and atraumatic. Neck:  Supple; no masses or thyromegaly. Lungs:  Clear throughout to auscultation, normal respiratory effort.    Heart:  +S1, +S2, Regular rate and rhythm, No edema. Abdomen:  Soft, nontender and nondistended. Normal bowel sounds, without guarding, and without rebound.   Neurologic:  Alert and  oriented x4;  grossly normal neurologically.  Impression/Plan: Robert Grant is here for an colonoscopy to be performed for Screening colonoscopy average risk   Risks, benefits, limitations, and alternatives regarding  colonoscopy have been reviewed with the patient.  Questions have been answered.  All parties agreeable.   Robert Bellows, MD  02/11/2022, 9:08 AM

## 2022-02-12 LAB — SURGICAL PATHOLOGY

## 2022-02-12 NOTE — Anesthesia Postprocedure Evaluation (Signed)
Anesthesia Post Note  Patient: Robert Grant  Procedure(s) Performed: COLONOSCOPY WITH PROPOFOL  Patient location during evaluation: PACU Anesthesia Type: General Level of consciousness: awake and alert, oriented and patient cooperative Pain management: pain level controlled Vital Signs Assessment: post-procedure vital signs reviewed and stable Respiratory status: spontaneous breathing, nonlabored ventilation and respiratory function stable Cardiovascular status: blood pressure returned to baseline and stable Postop Assessment: adequate PO intake Anesthetic complications: no   No notable events documented.   Last Vitals:  Vitals:   02/11/22 0755 02/11/22 0944  BP: (!) 178/121 108/63  Pulse: 71 78  Resp: 18 18  Temp: (!) 36 C (!) 36.3 C  SpO2: 98% 94%    Last Pain:  Vitals:   02/11/22 1004  TempSrc:   PainSc: 0-No pain                 Darrin Nipper

## 2022-02-15 ENCOUNTER — Encounter: Payer: Self-pay | Admitting: Gastroenterology

## 2022-02-16 ENCOUNTER — Telehealth: Payer: Self-pay

## 2022-02-16 NOTE — Telephone Encounter (Signed)
Its likely some bleeding from where the polyp was taken out which was pre cancerous - suggest to watch and most likely should resolve next few days- is he on a blood thinner- suggest to inform us on Thursday how he is doing

## 2022-02-16 NOTE — Telephone Encounter (Signed)
Patient called stating that he had his colonoscopy done on 02/11/2022 and ever since he came home he's been noticing rectal bleeding and stools are dark. Patient noticed some blood on the toilet. Patient denies any abdominal pain, fever, chills, diarrhea or constipation. Patient stated that he had not done anything different but would like to know what to do in this case. Please advise.

## 2022-02-17 NOTE — Telephone Encounter (Signed)
Called patient to let him know what Dr. Vicente Males suggested. Patient agreed but he stated that he believes that his issue had resolved since he had two bowel movements and he did not notice any blood. I told him to call us if he noticed any blood again. Patient agreed and had no further questions.

## 2022-02-18 ENCOUNTER — Telehealth: Payer: Self-pay

## 2022-02-18 ENCOUNTER — Encounter: Payer: Self-pay | Admitting: Gastroenterology

## 2022-02-18 NOTE — Telephone Encounter (Signed)
-----   Message from Jonathon Bellows, MD sent at 02/18/2022  1:05 PM EDT ----- Polyp was pre cancerous- extended to margins of resection- recommend repeat in 6-8 months to ensure no residual polyp

## 2022-02-18 NOTE — Telephone Encounter (Signed)
Called patient but had to leave him a voicemail letting him know that he will need to repeat his colonoscopy in 6 months to make sure that Dr. Tobi Bastos had removed all of the polyps and that nothing was left behind. Patient is placed in our recall list.

## 2022-04-06 LAB — COMPREHENSIVE METABOLIC PANEL
Albumin: 4.3 (ref 3.5–5.0)
Calcium: 9.1 (ref 8.7–10.7)
eGFR: 82

## 2022-04-06 LAB — BASIC METABOLIC PANEL
BUN: 12 (ref 4–21)
CO2: 25 — AB (ref 13–22)
Chloride: 104 (ref 99–108)
Creatinine: 1 (ref <=1.3)
Glucose: 159
Potassium: 4.1 mEq/L (ref 3.5–5.1)
Sodium: 139 (ref 137–147)

## 2022-04-06 LAB — CBC AND DIFFERENTIAL
HCT: 44 (ref 41–53)
Hemoglobin: 14.9 (ref 13.5–17.5)
Platelets: 151 10*3/uL (ref 150–400)
WBC: 7.5

## 2022-04-06 LAB — HEPATIC FUNCTION PANEL
AST: 31 (ref 14–40)
Alkaline Phosphatase: 60 (ref 25–125)
Bilirubin, Total: 1.4

## 2022-04-06 LAB — CBC: RBC: 4.98 (ref 3.87–5.11)

## 2022-04-09 ENCOUNTER — Encounter: Payer: Self-pay | Admitting: Physician Assistant

## 2022-07-19 ENCOUNTER — Encounter: Payer: Self-pay | Admitting: Physician Assistant

## 2022-07-19 ENCOUNTER — Ambulatory Visit (INDEPENDENT_AMBULATORY_CARE_PROVIDER_SITE_OTHER): Payer: Medicare Other | Admitting: Physician Assistant

## 2022-07-19 VITALS — BP 149/74 | HR 74 | Ht 69.0 in | Wt 224.8 lb

## 2022-07-19 DIAGNOSIS — E118 Type 2 diabetes mellitus with unspecified complications: Secondary | ICD-10-CM | POA: Diagnosis not present

## 2022-07-19 DIAGNOSIS — Z23 Encounter for immunization: Secondary | ICD-10-CM

## 2022-07-19 NOTE — Assessment & Plan Note (Addendum)
Pt managed with farxiga 10 mg  Last glucose unknown if fasting 11/23 was 115  Ordered A1c, previously 6.9% Foot exam utd Uacr utd  Optho? Pt has not been in years, stressed purpose/importance On statin, acei F/u 6 mo

## 2022-07-19 NOTE — Progress Notes (Signed)
I,Sha'taria Tyson,acting as a Education administrator for [employer], Robert Grant.,have documented all relevant documentation on the behalf of Robert Kirschner, Robert Grant,as directed by  Robert Kirschner, Robert Grant while in the presence of Robert Kirschner, Robert Grant.   Established patient visit   Patient: Robert Grant   DOB: 04-05-1956   66 y.o. Male  MRN: [id number] Visit Date: 07/19/2022  Today's healthcare provider: [name], Robert Grant   Cc. DM II f/u   Subjective    HPI  Diabetes Mellitus Type II, Follow-up  Lab Results  Component Value Date   HGBA1C 6.9 10/06/2021   HGBA1C 7.6 (H) 04/24/2021   HGBA1C 8.8 (A) 11/26/2019   Wt Readings from Last 3 Encounters:  07/19/22 224 lb 12.8 oz (102 kg)  02/11/22 210 lb (95.3 kg)  01/14/22 217 lb 12.8 oz (98.8 kg)   Last seen for diabetes 6 months ago.  Management since then includes no changes. He reports excellent compliance with treatment. He is not having side effects.  Symptoms: No fatigue No foot ulcerations  No appetite changes No nausea  No paresthesia of the feet  No polydipsia  No polyuria No visual disturbances   No vomiting     Home blood sugar records:  checked on occasions  Episodes of hypoglycemia? No    Current insulin regiment: none Most Recent Eye Exam: Need to update Current exercise: yard work Current diet habits: well balanced  Pertinent Labs: Lab Results  Component Value Date   CHOL 118 10/06/2021   HDL 32 (A) 10/06/2021   LDLCALC 45 10/06/2021   TRIG 238 (A) 10/06/2021   CHOLHDL 7.0 (H) 04/24/2021   Lab Results  Component Value Date   NA 139 04/06/2022   K 4.1 04/06/2022   CREATININE 1.0 04/06/2022   EGFR 82 04/06/2022   LABMICR 17.8 01/14/2022     ---------------------------------------------------------------------------------------------------   Medications: Outpatient Medications Prior to Visit  Medication Sig   atorvastatin (LIPITOR) 40 MG tablet TAKE 1 TABLET (40 MG TOTAL) BY MOUTH ONCE DAILY.   carvedilol  (COREG) 25 MG tablet Take 25 mg by mouth 2 (two) times daily.   dapagliflozin propanediol (FARXIGA) 10 MG TABS tablet Take 1 tablet (10 mg total) by mouth daily.   eplerenone (INSPRA) 25 MG tablet Take 25 mg by mouth 2 (two) times daily.    FLOWFLEX COVID-19 AG HOME TEST KIT See admin instructions.   fluticasone (FLONASE) 50 MCG/ACT nasal spray Place 2 sprays into both nostrils daily.   hydrALAZINE (APRESOLINE) 10 MG tablet Take 20 mg by mouth.   lisinopril (ZESTRIL) 10 MG tablet Take by mouth.   rivaroxaban (XARELTO) 20 MG TABS tablet Take 20 mg by mouth daily with supper.   torsemide (DEMADEX) 20 MG tablet TAKE 1 TABLETS BY MOUTH ONCE DAILY PRN weight gain >230   No facility-administered medications prior to visit.    Review of Systems  Constitutional:  Negative for fatigue and fever.  Respiratory:  Negative for cough and shortness of breath.   Cardiovascular:  Negative for chest pain, palpitations and leg swelling.  Neurological:  Negative for dizziness and headaches.      Objective    Blood pressure (!) 149/74, pulse 74, height _0  (1.753 m), weight 224 lb 12.8 oz (102 kg), SpO2 98 %.   Physical Exam Constitutional:      General: He is awake.     Appearance: He is well-developed.  HENT:     Head: Normocephalic.  Eyes:     Conjunctiva/sclera: Conjunctivae  normal.  Cardiovascular:     Rate and Rhythm: Normal rate and regular rhythm.     Heart sounds: Normal heart sounds.  Pulmonary:     Effort: Pulmonary effort is normal.     Breath sounds: Normal breath sounds.  Skin:    General: Skin is warm.  Neurological:     Mental Status: He is alert and oriented to person, place, and time.  Psychiatric:        Attention and Perception: Attention normal.        Mood and Affect: Mood normal.        Speech: Speech normal.        Behavior: Behavior is cooperative.      No results found for any visits on 07/19/22.  Assessment & Plan     Problem List Items Addressed This  Visit       Endocrine   Controlled type 2 diabetes mellitus with complication, without long-term current use of insulin ([place]) - Primary    Pt managed with farxiga 10 mg  Last glucose unknown if fasting 11/23 was 115  Ordered A1c, previously 6.9% Foot exam utd Uacr utd  Optho? Pt has not been in years, stressed purpose/importance On statin, acei F/u 6 mo       Relevant Orders   HgB A1c   Other Visit Diagnoses     Need for pneumococcal vaccine       Relevant Orders   Pneumococcal conjugate vaccine 20-valent (Prevnar 20) (Completed)      Per pt received both shingles vaccines. One is visible in [facility] advised pt to confirm w/ his pharmacy when he goes to receive RSV  Return in about 6 months (around 01/17/2023) for chronic conditions.     I, Robert Kirschner, Robert Grant have reviewed all documentation for this visit. The documentation on  07/19/2022 for the exam, diagnosis, procedures, and orders are all accurate and complete.  Robert Kirschner, Robert Grant [facility] [address] [place], [place], [address] Office: [phone] Fax: [place]

## 2022-07-20 LAB — HEMOGLOBIN A1C
Est. average glucose Bld gHb Est-mCnc: 154 mg/dL
Hgb A1c MFr Bld: 7 % — ABNORMAL HIGH (ref 4.8–5.6)

## 2022-10-01 ENCOUNTER — Other Ambulatory Visit: Payer: Self-pay

## 2022-10-01 DIAGNOSIS — N401 Enlarged prostate with lower urinary tract symptoms: Secondary | ICD-10-CM

## 2022-10-04 ENCOUNTER — Other Ambulatory Visit: Payer: Medicare Other

## 2022-10-04 DIAGNOSIS — N401 Enlarged prostate with lower urinary tract symptoms: Secondary | ICD-10-CM

## 2022-10-05 LAB — PSA: Prostate Specific Ag, Serum: 1.1 ng/mL (ref 0.0–4.0)

## 2022-10-06 NOTE — Progress Notes (Deleted)
 10/07/2022 2:39 PM   Robert Grant 10/17/1955 [id number]  CC: Follow up  Urological history 1. Nephrolithiasis - remote history of stones in the 1980's - CTU (03/2019) a 9 mm nonobstructing right renal stone - ESWL (05/2019) - Stone composition calcium oxalate monohydrate - RUS (10/2019) 5 mm right lower pole renal stone. No obstructive changes are noted - KUB (09/2020) no stones seen   2. High risk hematuria - Former smoker - CTU 03/2019 Nonobstructing RIGHT renal calculus. No ureterolithiasis or obstructive uropathy.  No enhancing renal lesion.  No bladder stones or filling defects in the bladder which does not excluded a bladder lesion - Cysto 03/2019 enlarged prostate with friable prostatic mucosa, bleeding with endoscopic manipulation -no reports of gross heme - UA ***  3. BPH with LU TS - PSA (09/2022) 1.1 - I PSS ***   HPI: Robert Grant is a 67 y.o. male who presents today for yearly visit.         Score:  1-7 Mild 8-19 Moderate 20-35 Severe     PMH: Past Medical History:  Diagnosis Date   A-fib (HCC)    AICD (automatic cardioverter/defibrillator) present 07/2015   AF, CHF   CHF (congestive heart failure) (HCC)    Controlled type 2 diabetes mellitus with complication, without long-term current use of insulin (HCC) 01/14/2022   Dysrhythmia    Afib   Family history of adverse reaction to anesthesia    Heart failure (HCC)    History of kidney stones    Hyperlipemia    Hypertension    Sleep apnea    uses cpap    Surgical History: Past Surgical History:  Procedure Laterality Date   APPENDECTOMY  2012   COLECTOMY  07/2011   COLONOSCOPY WITH PROPOFOL N/A 02/11/2022   Procedure: COLONOSCOPY WITH PROPOFOL;  Surgeon: Anna, Kiran, MD;  Location: [facility] ENDOSCOPY;  Service: Gastroenterology;  Laterality: N/A;   EXTRACORPOREAL SHOCK WAVE LITHOTRIPSY Right 05/17/2019   Procedure: EXTRACORPOREAL SHOCK WAVE LITHOTRIPSY (ESWL);  Surgeon: Brandon,  Ashley, MD;  Location: [facility] ORS;  Service: Urology;  Laterality: Right;   Implant of defibrillator Left 08-22-15   KNEE ARTHROSCOPY Left 10/2018   torn meniscus,  workers comp   KNEE SURGERY Right 2006   TONSILLECTOMY AND ADENOIDECTOMY  1960    Home Medications:  Allergies as of 10/07/2022       Reactions   Niacin Hives   Other reaction(s): Other (See Comments) Hot flashes   Spironolactone Other (See Comments)   gynecomastia        Medication List        Accurate as of October 06, 2022  2:39 PM. If you have any questions, ask your nurse or doctor.          atorvastatin 40 MG tablet Commonly known as: LIPITOR TAKE 1 TABLET (40 MG TOTAL) BY MOUTH ONCE DAILY.   carvedilol 25 MG tablet Commonly known as: COREG Take 25 mg by mouth 2 (two) times daily.   dapagliflozin propanediol 10 MG Tabs tablet Commonly known as: Farxiga Take 1 tablet (10 mg total) by mouth daily.   eplerenone 25 MG tablet Commonly known as: INSPRA Take 25 mg by mouth 2 (two) times daily.   Flowflex COVID-19 Ag Home Test Kit Generic drug: COVID-19 At Home Antigen Test See admin instructions.   fluticasone 50 MCG/ACT nasal spray Commonly known as: FLONASE Place 2 sprays into both nostrils daily.   hydrALAZINE 10 MG tablet Commonly known as: APRESOLINE Take   20 mg by mouth.   lisinopril 10 MG tablet Commonly known as: ZESTRIL Take by mouth.   rivaroxaban 20 MG Tabs tablet Commonly known as: XARELTO Take 20 mg by mouth daily with supper.   torsemide 20 MG tablet Commonly known as: DEMADEX TAKE 1 TABLETS BY MOUTH ONCE DAILY PRN weight gain >230       Allergies:  Allergies  Allergen Reactions   Niacin Hives    Other reaction(s): Other (See Comments) Hot flashes   Spironolactone Other (See Comments)    gynecomastia   Family History: Family History  Problem Relation Age of Onset   Diabetes Mother    Hypertension Mother    Kidney disease Mother    Hyperlipidemia Mother     Lung cancer Father    Prostate cancer Father    Congestive Heart Failure Maternal Grandmother    Congestive Heart Failure Paternal Grandmother    Social History:  reports that he quit smoking about 40 years ago. His smoking use included cigarettes. He has a 22.00 pack-year smoking history. He has never used smokeless tobacco. He reports that he does not drink alcohol and does not use drugs.  For pertinent review of systems please refer to history of present illness  Physical Exam: There were no vitals taken for this visit.  Constitutional:  Well nourished. Alert and oriented, No acute distress. HEENT: [place] AT, moist mucus membranes.  Trachea midline Cardiovascular: No clubbing, cyanosis, or edema. Respiratory: Normal respiratory effort, no increased work of breathing. GU: No CVA tenderness.  No bladder fullness or masses.  Patient with circumcised/uncircumcised phallus. ***Foreskin easily retracted***  Urethral meatus is patent.  No penile discharge. No penile lesions or rashes. Scrotum without lesions, cysts, rashes and/or edema.  Testicles are located scrotally bilaterally. No masses are appreciated in the testicles. Left and right epididymis are normal. Rectal: Patient with  normal sphincter tone. Anus and perineum without scarring or rashes. No rectal masses are appreciated. Prostate is approximately *** grams, *** nodules are appreciated. Seminal vesicles are normal. Neurologic: Grossly intact, no focal deficits, moving all 4 extremities. Psychiatric: Normal mood and affect.   Laboratory Data: Component     Latest Ref Rng 10/04/2022  Prostate Specific Ag, Serum     0.0 - 4.0 ng/mL 1.1     Component     Latest Ref Rng 07/19/2022  Hemoglobin A1C     4.8 - 5.6 % 7.0 (H)   Est. average glucose Bld gHb Est-mCnc     mg/dL 154     Legend: (H) High  Serum creatinine (06/2022) 0.9 Urinalysis  See EPIC and HPI I have reviewed the labs.  Pertinent Imaging: N/A  Assessment & Plan:     1. BPH with LUTS -PSA stable -DRE benign -continue conservative management, avoiding bladder irritants and timed voiding's  2. High risk hematuria -work up 2020 - NED -no reports of gross heme -UA ***  No follow-ups on file.  Lorelei Heikkila, PA-C  [facility] [address], [address] [place] [address] [phone] 

## 2022-10-07 ENCOUNTER — Ambulatory Visit (INDEPENDENT_AMBULATORY_CARE_PROVIDER_SITE_OTHER): Payer: Medicare Other | Admitting: Physician Assistant

## 2022-10-07 ENCOUNTER — Encounter: Payer: Self-pay | Admitting: Physician Assistant

## 2022-10-07 ENCOUNTER — Ambulatory Visit: Payer: Medicare Other | Admitting: Urology

## 2022-10-07 VITALS — BP 141/82 | HR 78 | Temp 98.5°F | Wt 226.8 lb

## 2022-10-07 DIAGNOSIS — J0110 Acute frontal sinusitis, unspecified: Secondary | ICD-10-CM | POA: Diagnosis not present

## 2022-10-07 DIAGNOSIS — N401 Enlarged prostate with lower urinary tract symptoms: Secondary | ICD-10-CM

## 2022-10-07 DIAGNOSIS — R319 Hematuria, unspecified: Secondary | ICD-10-CM

## 2022-10-07 MED ORDER — AMOXICILLIN 875 MG PO TABS: 875.0000 mg | ORAL_TABLET | Freq: Two times a day (BID) | ORAL | 0 refills | Status: AC

## 2022-10-07 NOTE — Progress Notes (Signed)
I,Sha'taria Tyson,acting as a Education administrator for [employer], PA-C.,have documented all relevant documentation on the behalf of [name], PA-C,as directed by  [name], PA-C while in the presence of [name], PA-C.   Established patient visit   Patient: Robert Grant   DOB: 22-Feb-1956   67 y.o. Male  MRN: [id number] Visit Date: 10/07/2022  Today's healthcare provider: [name], PA-C   Cc. Headache, nasal congestion x 1 week  Subjective    HPI   Pt reports headache, nasal congestion, sneezing, coughing x 1 week. Reports taking corcidin BP, benadryl and using flonase with no relief. Denies SOB, wheezing, fevers. Declines covid and flu testing.   Medications: Outpatient Medications Prior to Visit  Medication Sig   atorvastatin (LIPITOR) 40 MG tablet TAKE 1 TABLET (40 MG TOTAL) BY MOUTH ONCE DAILY.   carvedilol (COREG) 25 MG tablet Take 25 mg by mouth 2 (two) times daily.   dapagliflozin propanediol (FARXIGA) 10 MG TABS tablet Take 1 tablet (10 mg total) by mouth daily.   eplerenone (INSPRA) 25 MG tablet Take 25 mg by mouth 2 (two) times daily.    FLOWFLEX COVID-19 AG HOME TEST KIT See admin instructions.   fluticasone (FLONASE) 50 MCG/ACT nasal spray Place 2 sprays into both nostrils daily.   hydrALAZINE (APRESOLINE) 10 MG tablet Take 20 mg by mouth.   lisinopril (ZESTRIL) 10 MG tablet Take by mouth.   rivaroxaban (XARELTO) 20 MG TABS tablet Take 20 mg by mouth daily with supper.   torsemide (DEMADEX) 20 MG tablet TAKE 1 TABLETS BY MOUTH ONCE DAILY PRN weight gain >230   No facility-administered medications prior to visit.    Review of Systems  Constitutional:  Positive for fatigue. Negative for fever.  HENT:  Positive for congestion, sinus pressure and sinus pain.   Respiratory:  Positive for cough. Negative for shortness of breath.   Cardiovascular:  Negative for chest pain, palpitations and leg swelling.  Neurological:  Positive for headaches.  Negative for dizziness.      Objective    Blood pressure (!) 141/82, pulse 78, temperature 98.5 F (36.9 C), temperature source Oral, weight 226 lb 12.8 oz (102.9 kg), SpO2 98 %.   Physical Exam Constitutional:      General: He is awake.     Appearance: He is well-developed.  HENT:     Head: Normocephalic.     Right Ear: Tympanic membrane normal.     Left Ear: Tympanic membrane normal.     Mouth/Throat:     Pharynx: No oropharyngeal exudate or posterior oropharyngeal erythema.  Eyes:     Conjunctiva/sclera: Conjunctivae normal.  Cardiovascular:     Rate and Rhythm: Normal rate and regular rhythm.     Heart sounds: Normal heart sounds.  Pulmonary:     Effort: Pulmonary effort is normal.     Breath sounds: Normal breath sounds.  Skin:    General: Skin is warm.  Neurological:     Mental Status: He is alert and oriented to person, place, and time.  Psychiatric:        Attention and Perception: Attention normal.        Mood and Affect: Mood normal.        Speech: Speech normal.        Behavior: Behavior is cooperative.     No results found for any visits on 10/07/22.  Assessment & Plan     Acute sinusitis Rx amoxicillin 875 mg bid x 7 days,  take with food, increase fluid intake Continue flonase   Return if symptoms worsen or fail to improve.      I, [name], PA-C have reviewed all documentation for this visit. The documentation on  10/07/22 for the exam, diagnosis, procedures, and orders are all accurate and complete.  [name], PA-C [facility] [address] [place], [place], [address] Office: [phone] Fax: [place]

## 2022-10-15 ENCOUNTER — Other Ambulatory Visit: Payer: Self-pay | Admitting: Physician Assistant

## 2022-10-15 ENCOUNTER — Ambulatory Visit: Payer: Self-pay

## 2022-10-15 MED ORDER — AMOXICILLIN 875 MG PO TABS: 875.0000 mg | ORAL_TABLET | Freq: Two times a day (BID) | ORAL | 0 refills | Status: AC

## 2022-10-15 NOTE — Telephone Encounter (Signed)
  Chief Complaint: Continuing sinus issues cough Symptoms: above Frequency: 10/07/2022 Pertinent Negatives: Patient denies Disposition: []$ ED /[]$ Urgent Care (no appt availability in office) / []$ Appointment(In office/virtual)/ []$  [facility]/ []$ Home Care/ [x]$ Refused Recommended Disposition /[]$ [facility]/ []$  Follow-up with PCP Additional Notes: Pt was seen in office and given antibiotics. Pt states he is better, but still feels that he has an infection. PT would like additional antibiotics for sinus infection. Pt states that in the past he has needed 2 rounds of antibiotics for resolution.  Please advise.   Summary: still having sinus issues drainage and cough.   Pt stated he came in to see PCP 10/07/2022 and is currently still having sinus issues drainage and cough. Stated he feels a little better, but it hasn't cleared up. Pt requesting new Rx.   Pt seeking clinical advice.       Reason for Disposition  [1] Finished taking antibiotics AND [2] symptoms are BETTER but [3] not completely gone  Answer Assessment - Initial Assessment Questions 1. INFECTION: "What infection is the antibiotic being given for?"     Sinus infection 2. ANTIBIOTIC: "What antibiotic are you taking" "How many times per day?"     Amoxicillin 3. DURATION: "When was the antibiotic started?"     10/07/2022 4. MAIN CONCERN OR SYMPTOM:  "What is your main concern right now?"     Sinus issues - drainage, cough 5. BETTER-SAME-WORSE: "Are you getting better, staying the same, or getting worse compared to when you first started the antibiotics?" If getting worse, ask: "In what way?"      Better 6. FEVER: "Do you have a fever?" If Yes, ask: "What is your temperature, how was it measured, and when did it start?"     Has the chills 7. SYMPTOMS: "Are there any other symptoms you're concerned about?" If Yes, ask: "When did it start?"     Sinus 8. FOLLOW-UP APPOINTMENT: "Do you have a follow-up appointment  with your doctor?"     no  Protocols used: Infection on Antibiotic Follow-up Call-A-AH

## 2022-10-15 NOTE — Telephone Encounter (Signed)
Patient advised.

## 2022-10-18 NOTE — Progress Notes (Deleted)
10/07/2022 2:39 PM   Robert Grant 1956/02/11 [id number]  CC: Follow up  Urological history 1. Nephrolithiasis - remote history of stones in the 1980's - CTU (03/2019) a 9 mm nonobstructing right renal stone - ESWL (05/2019) - Stone composition calcium oxalate monohydrate - RUS (10/2019) 5 mm right lower pole renal stone. No obstructive changes are noted - KUB (09/2020) no stones seen   2. High risk hematuria - Former smoker - CTU 03/2019 Nonobstructing RIGHT renal calculus. No ureterolithiasis or obstructive uropathy.  No enhancing renal lesion.  No bladder stones or filling defects in the bladder which does not excluded a bladder lesion - Cysto 03/2019 enlarged prostate with friable prostatic mucosa, bleeding with endoscopic manipulation -no reports of gross heme - UA ***  3. BPH with LU TS - PSA (09/2022) 1.1 - I PSS ***   HPI: Robert Grant is a 67 y.o. male who presents today for yearly visit.         Score:  1-7 Mild 8-19 Moderate 20-35 Severe     PMH: Past Medical History:  Diagnosis Date   A-fib ([place])    AICD (automatic cardioverter/defibrillator) present 07/2015   AF, CHF   CHF (congestive heart failure) ([place])    Controlled type 2 diabetes mellitus with complication, without long-term current use of insulin ([place]) 01/14/2022   Dysrhythmia    Afib   Family history of adverse reaction to anesthesia    Heart failure ([place])    History of kidney stones    Hyperlipemia    Hypertension    Sleep apnea    uses cpap    Surgical History: Past Surgical History:  Procedure Laterality Date   APPENDECTOMY  2012   COLECTOMY  07/2011   COLONOSCOPY WITH PROPOFOL N/A 02/11/2022   Procedure: COLONOSCOPY WITH PROPOFOL;  Surgeon: Jonathon Bellows, MD;  [facility] END[facility]COPY;  Service: Gastroenterology;  Laterality: N/A;   EXTRACORPOREAL SHOCK WAVE LITHOTRIPSY Right 05/17/2019   Procedure: EXTRACORPOREAL SHOCK WAVE LITHOTRIPSY (ESWL);  Surgeon: Hollice Espy, MD;  Location: [facility] ORS;  Service: Urology;  Laterality: Right;   Implant of defibrillator Left 08-22-15   KNEE ARTHROSCOPY Left 10/2018   torn meniscus,  workers comp   KNEE SURGERY Right 2006   TONSILLECTOMY AND ADENOIDECTOMY  1960    Home Medications:  Allergies as of 10/07/2022       Reactions   Niacin Hives   Other reaction(s): Other (See Comments) Hot flashes   Spironolactone Other (See Comments)   gynecomastia        Medication List        Accurate as of October 06, 2022  2:39 PM. If you have any questions, ask your nurse or doctor.          atorvastatin 40 MG tablet Commonly known as: LIPITOR TAKE 1 TABLET (40 MG TOTAL) BY MOUTH ONCE DAILY.   carvedilol 25 MG tablet Commonly known as: COREG Take 25 mg by mouth 2 (two) times daily.   dapagliflozin propanediol 10 MG Tabs tablet Commonly known as: Farxiga Take 1 tablet (10 mg total) by mouth daily.   eplerenone 25 MG tablet Commonly known as: INSPRA Take 25 mg by mouth 2 (two) times daily.   Flowflex COVID-19 Ag Home Test Kit Generic drug: COVID-19 At Home Antigen Test See admin instructions.   fluticasone 50 MCG/ACT nasal spray Commonly known as: FLONASE Place 2 sprays into both nostrils daily.   hydrALAZINE 10 MG tablet Commonly known as: APRESOLINE Take  20 mg by mouth.   lisinopril 10 MG tablet Commonly known as: ZESTRIL Take by mouth.   rivaroxaban 20 MG Tabs tablet Commonly known as: XARELTO Take 20 mg by mouth daily with supper.   torsemide 20 MG tablet Commonly known as: DEMADEX TAKE 1 TABLETS BY MOUTH ONCE DAILY PRN weight gain >230       Allergies:  Allergies  Allergen Reactions   Niacin Hives    Other reaction(s): Other (See Comments) Hot flashes   Spironolactone Other (See Comments)    gynecomastia   Family History: Family History  Problem Relation Age of Onset   Diabetes Mother    Hypertension Mother    Kidney disease Mother    Hyperlipidemia Mother     Lung cancer Father    Prostate cancer Father    Congestive Heart Failure Maternal Grandmother    Congestive Heart Failure Paternal Grandmother    Social History:  reports that he quit smoking about 40 years ago. His smoking use included cigarettes. He has a 22.00 pack-year smoking history. He has never used smokeless tobacco. He reports that he does not drink alcohol and does not use drugs.  For pertinent review of systems please refer to history of present illness  Physical Exam: There were no vitals taken for this visit.  Constitutional:  Well nourished. Alert and oriented, No acute distress. HEENT: [place] AT, moist mucus membranes.  Trachea midline Cardiovascular: No clubbing, cyanosis, or edema. Respiratory: Normal respiratory effort, no increased work of breathing. GU: No CVA tenderness.  No bladder fullness or masses.  Patient with circumcised/uncircumcised phallus. ***Foreskin easily retracted***  Urethral meatus is patent.  No penile discharge. No penile lesions or rashes. Scrotum without lesions, cysts, rashes and/or edema.  Testicles are located scrotally bilaterally. No masses are appreciated in the testicles. Left and right epididymis are normal. Rectal: Patient with  normal sphincter tone. Anus and perineum without scarring or rashes. No rectal masses are appreciated. Prostate is approximately *** grams, *** nodules are appreciated. Seminal vesicles are normal. Neurologic: Grossly intact, no focal deficits, moving all 4 extremities. Psychiatric: Normal mood and affect.   Laboratory Data: Component     Latest Ref Rng 10/04/2022  Prostate Specific Ag, Serum     0.0 - 4.0 ng/mL 1.1     Component     Latest Ref Rng 07/19/2022  Hemoglobin A1C     4.8 - 5.6 % 7.0 (H)   Est. average glucose Bld gHb Est-mCnc     mg/dL 154     Legend: (H) High  Serum creatinine (06/2022) 0.9 Urinalysis  See EPIC and HPI I have reviewed the labs.  Pertinent Imaging: N/A  Assessment & Plan:     1. BPH with LUTS -PSA stable -DRE benign -continue conservative management, avoiding bladder irritants and timed voiding's  2. High risk hematuria -work up 2020 - NED -no reports of gross heme -UA ***  No follow-ups on file.  Eric Nees, [place] [address], [place], [place] [address] [phone]

## 2022-10-19 ENCOUNTER — Ambulatory Visit: Payer: Medicare Other | Admitting: Urology

## 2022-12-06 ENCOUNTER — Telehealth: Payer: Self-pay | Admitting: Physician Assistant

## 2022-12-06 NOTE — Telephone Encounter (Signed)
Called patient to schedule Medicare Annual Wellness Visit (AWV). Left message for patient to call back and schedule Medicare Annual Wellness Visit (AWV).  Last date of AWV: 01/15/2023  Please schedule an appointment at any time with [facility].  If any questions, please contact me at [phone].  Thank you ,  Randon Goldsmith Care Guide [facility] AWV TEAM Direct Dial: [phone]

## 2023-01-05 ENCOUNTER — Encounter: Payer: Self-pay | Admitting: Family Medicine

## 2023-01-05 ENCOUNTER — Observation Stay
Admission: EM | Admit: 2023-01-05 | Discharge: 2023-01-06 | Disposition: A | Discharge status: Home / Self Care | Payer: Medicare Other | Attending: Internal Medicine | Admitting: Internal Medicine

## 2023-01-05 ENCOUNTER — Other Ambulatory Visit: Payer: Self-pay

## 2023-01-05 ENCOUNTER — Inpatient Hospital Stay
Admit: 2023-01-05 | Discharge: 2023-01-05 | Disposition: A | Discharge status: Home / Self Care | Payer: Medicare Other | Attending: Family Medicine | Admitting: Family Medicine

## 2023-01-05 ENCOUNTER — Emergency Department: Payer: Medicare Other

## 2023-01-05 DIAGNOSIS — I4891 Unspecified atrial fibrillation: Principal | ICD-10-CM | POA: Diagnosis present

## 2023-01-05 DIAGNOSIS — Z1152 Encounter for screening for COVID-19: Secondary | ICD-10-CM | POA: Diagnosis not present

## 2023-01-05 DIAGNOSIS — Z95810 Presence of automatic (implantable) cardiac defibrillator: Secondary | ICD-10-CM | POA: Diagnosis present

## 2023-01-05 DIAGNOSIS — R778 Other specified abnormalities of plasma proteins: Secondary | ICD-10-CM | POA: Insufficient documentation

## 2023-01-05 DIAGNOSIS — I5042 Chronic combined systolic (congestive) and diastolic (congestive) heart failure: Secondary | ICD-10-CM | POA: Diagnosis not present

## 2023-01-05 DIAGNOSIS — E119 Type 2 diabetes mellitus without complications: Secondary | ICD-10-CM | POA: Insufficient documentation

## 2023-01-05 DIAGNOSIS — Z96651 Presence of right artificial knee joint: Secondary | ICD-10-CM | POA: Diagnosis not present

## 2023-01-05 DIAGNOSIS — Z87891 Personal history of nicotine dependence: Secondary | ICD-10-CM | POA: Insufficient documentation

## 2023-01-05 DIAGNOSIS — E1169 Type 2 diabetes mellitus with other specified complication: Secondary | ICD-10-CM | POA: Diagnosis present

## 2023-01-05 DIAGNOSIS — Z7901 Long term (current) use of anticoagulants: Secondary | ICD-10-CM | POA: Diagnosis not present

## 2023-01-05 DIAGNOSIS — R079 Chest pain, unspecified: Secondary | ICD-10-CM

## 2023-01-05 DIAGNOSIS — R7989 Other specified abnormal findings of blood chemistry: Secondary | ICD-10-CM

## 2023-01-05 DIAGNOSIS — I429 Cardiomyopathy, unspecified: Secondary | ICD-10-CM | POA: Diagnosis not present

## 2023-01-05 DIAGNOSIS — R002 Palpitations: Secondary | ICD-10-CM | POA: Diagnosis present

## 2023-01-05 DIAGNOSIS — E1159 Type 2 diabetes mellitus with other circulatory complications: Secondary | ICD-10-CM | POA: Diagnosis present

## 2023-01-05 DIAGNOSIS — I472 Ventricular tachycardia, unspecified: Secondary | ICD-10-CM

## 2023-01-05 DIAGNOSIS — I110 Hypertensive heart disease with heart failure: Secondary | ICD-10-CM | POA: Diagnosis not present

## 2023-01-05 DIAGNOSIS — I10 Essential (primary) hypertension: Secondary | ICD-10-CM

## 2023-01-05 DIAGNOSIS — E118 Type 2 diabetes mellitus with unspecified complications: Secondary | ICD-10-CM | POA: Diagnosis present

## 2023-01-05 DIAGNOSIS — I152 Hypertension secondary to endocrine disorders: Secondary | ICD-10-CM | POA: Diagnosis present

## 2023-01-05 DIAGNOSIS — I428 Other cardiomyopathies: Secondary | ICD-10-CM | POA: Diagnosis not present

## 2023-01-05 DIAGNOSIS — E785 Hyperlipidemia, unspecified: Secondary | ICD-10-CM | POA: Diagnosis present

## 2023-01-05 LAB — CBC WITH DIFFERENTIAL/PLATELET
Abs Immature Granulocytes: 0.02 10*3/uL (ref 0.00–0.07)
Basophils Absolute: 0.1 10*3/uL (ref 0.0–0.1)
Basophils Relative: 1 %
Eosinophils Absolute: 0.2 10*3/uL (ref 0.0–0.5)
Eosinophils Relative: 2 %
HCT: 44.2 % (ref 39.0–52.0)
Hemoglobin: 14.5 g/dL (ref 13.0–17.0)
Immature Granulocytes: 0 %
Lymphocytes Relative: 41 %
Lymphs Abs: 3.5 10*3/uL (ref 0.7–4.0)
MCH: 30.3 pg (ref 26.0–34.0)
MCHC: 32.8 g/dL (ref 30.0–36.0)
MCV: 92.5 fL (ref 80.0–100.0)
Monocytes Absolute: 0.7 10*3/uL (ref 0.1–1.0)
Monocytes Relative: 9 %
Neutro Abs: 4.1 10*3/uL (ref 1.7–7.7)
Neutrophils Relative %: 47 %
Platelets: 165 10*3/uL (ref 150–400)
RBC: 4.78 MIL/uL (ref 4.22–5.81)
RDW: 14.2 % (ref 11.5–15.5)
WBC: 8.7 10*3/uL (ref 4.0–10.5)
nRBC: 0 % (ref 0.0–0.2)

## 2023-01-05 LAB — COMPREHENSIVE METABOLIC PANEL
ALT: 23 U/L (ref 0–44)
AST: 28 U/L (ref 15–41)
Albumin: 4.1 g/dL (ref 3.5–5.0)
Alkaline Phosphatase: 68 U/L (ref 38–126)
Anion gap: 9 (ref 5–15)
BUN: 22 mg/dL (ref 8–23)
CO2: 21 mmol/L — ABNORMAL LOW (ref 22–32)
Calcium: 8.7 mg/dL — ABNORMAL LOW (ref 8.9–10.3)
Chloride: 109 mmol/L (ref 98–111)
Creatinine, Ser: 1.02 mg/dL (ref 0.61–1.24)
GFR, Estimated: >60 mL/min (ref >60)
Glucose, Bld: 212 mg/dL — ABNORMAL HIGH (ref 70–99)
Potassium: 3.7 mmol/L (ref 3.5–5.1)
Sodium: 139 mmol/L (ref 135–145)
Total Bilirubin: 1 mg/dL (ref 0.3–1.2)
Total Protein: 6.5 g/dL (ref 6.5–8.1)

## 2023-01-05 LAB — ECHOCARDIOGRAM COMPLETE
AR max vel: 1.39 cm2
AV Area VTI: 1.38 cm2
AV Area mean vel: 1.22 cm2
AV Mean grad: 8.7 mmHg
AV Peak grad: 16.2 mmHg
Ao pk vel: 2.01 m/s
Area-P 1/2: 4.26 cm2
MV VTI: 2.54 cm2
S' Lateral: 5.5 cm

## 2023-01-05 LAB — URINALYSIS, ROUTINE W REFLEX MICROSCOPIC
Bacteria, UA: NONE SEEN
Bilirubin Urine: NEGATIVE
Glucose, UA: >=500 mg/dL — AB
Hgb urine dipstick: NEGATIVE
Ketones, ur: NEGATIVE mg/dL
Leukocytes,Ua: NEGATIVE
Nitrite: NEGATIVE
Protein, ur: 30 mg/dL — AB
Specific Gravity, Urine: 1.022 (ref 1.005–1.030)
pH: 5 (ref 5.0–8.0)

## 2023-01-05 LAB — TROPONIN I (HIGH SENSITIVITY)
Troponin I (High Sensitivity): 54 ng/L — ABNORMAL HIGH (ref <18)
Troponin I (High Sensitivity): 54 ng/L — ABNORMAL HIGH (ref <18)

## 2023-01-05 LAB — SARS CORONAVIRUS 2 BY RT PCR: SARS Coronavirus 2 by RT PCR: NEGATIVE

## 2023-01-05 LAB — BRAIN NATRIURETIC PEPTIDE: B Natriuretic Peptide: 745.2 pg/mL — ABNORMAL HIGH (ref 0.0–100.0)

## 2023-01-05 LAB — TSH: TSH: 3.674 u[IU]/mL (ref 0.350–4.500)

## 2023-01-05 LAB — T4, FREE: Free T4: 0.94 ng/dL (ref 0.61–1.12)

## 2023-01-05 MED ORDER — SODIUM CHLORIDE 0.9 % IV SOLN: INTRAVENOUS | Status: DC

## 2023-01-05 MED ORDER — ONDANSETRON HCL 4 MG/2ML IJ SOLN: 4.0000 mg | Freq: Four times a day (QID) | INTRAMUSCULAR | Status: DC | PRN

## 2023-01-05 MED ORDER — HYDRALAZINE HCL 10 MG PO TABS
20.0000 mg | ORAL_TABLET | Freq: Four times a day (QID) | ORAL | Status: DC
  Filled 2023-01-05 (×2): qty 2

## 2023-01-05 MED ORDER — POTASSIUM CHLORIDE CRYS ER 20 MEQ PO TBCR
40.0000 meq | EXTENDED_RELEASE_TABLET | Freq: Four times a day (QID) | ORAL | Status: AC
  Administered 2023-01-05 (×2): 40 meq via ORAL
  Filled 2023-01-05 (×2): qty 2

## 2023-01-05 MED ORDER — AMIODARONE HCL 200 MG PO TABS: 200.0000 mg | ORAL_TABLET | Freq: Every day | ORAL | Status: DC

## 2023-01-05 MED ORDER — PERFLUTREN LIPID MICROSPHERE
1.0000 mL | INTRAVENOUS | Status: AC | PRN
  Administered 2023-01-05: 5 mL via INTRAVENOUS

## 2023-01-05 MED ORDER — RIVAROXABAN 20 MG PO TABS
20.0000 mg | ORAL_TABLET | Freq: Every day | ORAL | Status: DC
  Administered 2023-01-05: 20 mg via ORAL
  Filled 2023-01-05: qty 1

## 2023-01-05 MED ORDER — AMIODARONE HCL 200 MG PO TABS
400.0000 mg | ORAL_TABLET | Freq: Two times a day (BID) | ORAL | Status: DC
  Administered 2023-01-05 – 2023-01-06 (×3): 400 mg via ORAL
  Filled 2023-01-05 (×3): qty 2

## 2023-01-05 MED ORDER — CARVEDILOL 6.25 MG PO TABS
25.0000 mg | ORAL_TABLET | Freq: Two times a day (BID) | ORAL | Status: DC
  Administered 2023-01-05 – 2023-01-06 (×3): 25 mg via ORAL
  Filled 2023-01-05 (×3): qty 4

## 2023-01-05 MED ORDER — ONDANSETRON HCL 4 MG PO TABS: 4.0000 mg | ORAL_TABLET | Freq: Four times a day (QID) | ORAL | Status: DC | PRN

## 2023-01-05 MED ORDER — FUROSEMIDE 10 MG/ML IJ SOLN
40.0000 mg | Freq: Once | INTRAMUSCULAR | Status: AC
  Administered 2023-01-05: 40 mg via INTRAVENOUS
  Filled 2023-01-05: qty 4

## 2023-01-05 MED ORDER — ATORVASTATIN CALCIUM 20 MG PO TABS
40.0000 mg | ORAL_TABLET | Freq: Every day | ORAL | Status: DC
  Administered 2023-01-05 – 2023-01-06 (×2): 40 mg via ORAL
  Filled 2023-01-05 (×2): qty 2

## 2023-01-05 MED ORDER — TORSEMIDE 20 MG PO TABS
20.0000 mg | ORAL_TABLET | Freq: Every day | ORAL | Status: DC
  Administered 2023-01-05: 20 mg via ORAL
  Filled 2023-01-05: qty 1

## 2023-01-05 NOTE — Consult Note (Signed)
[facility] CARDIOLOGY CONSULT NOTE       Patient ID: Robert Grant MRN: [id number] DOB/AGE: [birth year] 67 y.o.  Admit date: 01/05/2023 Referring Physician Dr Doree Albee  Primary Physician Arvella Nigh, PA-C  Primary Cardiologist Dr. Darin Engels ([facility])  Reason for Consultation paroxysmal AF RVR  HPI: Robert Grant is a 67yoM with a PMH of NICM (last EF by cMRI 43% 04/2022), s/p primary prevention [employer] ICD, paroxysmal atrial fibrillation (Xarelto, s/p successful DCCV 2016), DM2, HTN, OSA with intermittent CPAP compliance who presented to [facility] ED the early morning hours with heart racing and palpitations, was found to be in AF with RVR for which cardiology is consulted for further assistance.  The patient presents with his wife who contributes to the history.  He states that yesterday morning he did not feel well in general, was feeling fatigued with some nasal congestion, lightheadedness, and a headache.  His day was fairly uneventful, but as he laid down to go to bed around 1 in the morning he suddenly felt his heart racing.  He took his blood pressure which was 121/90, but his heart rate was steady in the 160s and he felt somewhat lightheaded.  He had his wife called EMS and present to the emergency department.  Upon arrival he was in atrial fibrillation with RVR with rates in the 150s to 190s, was given 10 mg of IV Cardizem x 1 with good control of his heart rate in the 90s to low 110s.  He denied chest pain or shortness of breath, he has chronic two-pillow orthopnea which is at baseline.  He felt as though his belly was "bloated" yesterday, but denies lower extremity edema or increase in his weight (he has been stable between 220-223 lately).  He takes torsemide 20 mg as needed for weight gain, and has only needed to take 1 dose on a single occasion last week.  He will sometimes base his torsemide dosing off if he sees distention in his neck.  At my time of evaluation this  morning he is feeling much better than when he first presented, denies current chest pain, heart racing but can still tell that he is in atrial fibrillation.  He admits to compliance with all of his cardiac medications including his Xarelto, although he notes he only intermittently wears his CPAP at night.  Recent vitals notable for a blood pressure of 128/72, he is in atrial fibrillation on telemetry with rate in the low 90s to low 100s, he is comfortable on room air saturating well at 96%.  Afebrile.  Labs notable for a potassium 3.7, BUN/creatinine 22/1.02 and GFR >60.  AST ALT within normal limits at 28/23 alk phos 68.  BNP mildly elevated at 745.  High-sensitivity troponin borderline elevated and flat trending at 54, 54.  H&H within normal limits at 14.5/44.2 TSH and free T4 both within normal limits at 3.674 and free T4 at 0.94.  Chest x-ray demonstrating the single chamber ICD lead in the right ventricle without edema consolidation, effusion or pneumothorax.  Review of systems complete and found to be negative unless listed above     Past Medical History:  Diagnosis Date   A-fib [facility])    AICD (automatic cardioverter/defibrillator) present 07/2015   AF, CHF   CHF (congestive heart failure) (HCC)    Controlled type 2 diabetes mellitus with complication, without long-term current use of insulin (HCC) 01/14/2022   Dysrhythmia    Afib   Family history of adverse reaction  to anesthesia    Heart failure (HCC)    History of kidney stones    Hyperlipemia    Hypertension    Sleep apnea    uses cpap    Past Surgical History:  Procedure Laterality Date   APPENDECTOMY  2012   COLECTOMY  07/2011   COLONOSCOPY WITH PROPOFOL N/A 02/11/2022   Procedure: COLONOSCOPY WITH PROPOFOL;  Surgeon: Wyline Mood, MD;  Location: [facility] ENDOSCOPY;  Service: Gastroenterology;  Laterality: N/A;   EXTRACORPOREAL SHOCK WAVE LITHOTRIPSY Right 05/17/2019   Procedure: EXTRACORPOREAL SHOCK WAVE LITHOTRIPSY (ESWL);   Surgeon: Vanna Scotland, MD;  Location: [facility] ORS;  Service: Urology;  Laterality: Right;   Implant of defibrillator Left 08-22-15   KNEE ARTHROSCOPY Left 10/2018   torn meniscus,  workers comp   KNEE SURGERY Right 2006   TONSILLECTOMY AND ADENOIDECTOMY  1960    (Not in a hospital admission)  Social History   Socioeconomic History   Marital status: Married    Spouse name: Kendal Hymen   Number of children: Not on file   Years of education: Not on file   Highest education level: Not on file  Occupational History   Occupation: out on workers comp d/t knee injury  Tobacco Use   Smoking status: Former    Packs/day: 2.00    Years: 11.00    Additional pack years: 0.00    Total pack years: 22.00    Types: Cigarettes    Quit date: 08/1982    Years since quitting: 40.3   Smokeless tobacco: Never  Vaping Use   Vaping Use: Never used  Substance and Sexual Activity   Alcohol use: No    Alcohol/week: 0.0 standard drinks of alcohol   Drug use: No   Sexual activity: Yes    Birth control/protection: None  Other Topics Concern   Not on file  Social History Narrative   Not on file   Social Determinants of Health   Financial Resource Strain: Not on file  Food Insecurity: Not on file  Transportation Needs: Not on file  Physical Activity: Not on file  Stress: Not on file  Social Connections: Not on file  Intimate Partner Violence: Not on file    Family History  Problem Relation Age of Onset   Diabetes Mother    Hypertension Mother    Kidney disease Mother    Hyperlipidemia Mother    Lung cancer Father    Prostate cancer Father    Congestive Heart Failure Maternal Grandmother    Congestive Heart Failure Paternal Grandmother      No intake or output data in the 24 hours ending 01/05/23 0822  Vitals:   01/05/23 0351 01/05/23 0630 01/05/23 0700 01/05/23 0730  BP: 129/79 121/79 115/74 (!) 135/90  Pulse: (!) 104 95 (!) 101 (!) 105  Resp: 20 (!) 21 (!) 21 17  Temp: (!) 97.5 F  (36.4 C)     TempSrc: Oral     SpO2: 96% 99% 96% 97%    PHYSICAL EXAM General: pleasant caucasian male, well nourished, in no acute distress. Sitting up in bed with wife at bedside HEENT:  Normocephalic and atraumatic. Neck:  + JVD at 30 degrees.  Lungs: Normal respiratory effort on room air. Trace crackles right base, no wheezing Heart: irregular irregular with controlled rate . Normal S1 and S2 without gallops or murmurs.  Abdomen: mildly distended with excess adiposity  Msk: Normal strength and tone for age. Extremities: Warm and well perfused. No clubbing, cyanosis. No peripheral  edema.  Neuro: Alert and oriented X 3. Psych:  Answers questions appropriately.   Labs: Basic Metabolic Panel: Recent Labs    01/05/23 0354  NA 139  K 3.7  CL 109  CO2 21*  GLUCOSE 212*  BUN 22  CREATININE 1.02  CALCIUM 8.7*   Liver Function Tests: Recent Labs    01/05/23 0354  AST 28  ALT 23  ALKPHOS 68  BILITOT 1.0  PROT 6.5  ALBUMIN 4.1   No results for input(s): "LIPASE", "AMYLASE" in the last 72 hours. CBC: Recent Labs    01/05/23 0354  WBC 8.7  NEUTROABS 4.1  HGB 14.5  HCT 44.2  MCV 92.5  PLT 165   Cardiac Enzymes: Recent Labs    01/05/23 0354 01/05/23 0552  TROPONINIHS 54* 54*   BNP: Recent Labs    01/05/23 0354  BNP 745.2*   D-Dimer: No results for input(s): "DDIMER" in the last 72 hours. Hemoglobin A1C: No results for input(s): "HGBA1C" in the last 72 hours. Fasting Lipid Panel: No results for input(s): "CHOL", "HDL", "LDLCALC", "TRIG", "CHOLHDL", "LDLDIRECT" in the last 72 hours. Thyroid Function Tests: Recent Labs    01/05/23 0354  TSH 3.674   Anemia Panel: No results for input(s): "VITAMINB12", "FOLATE", "FERRITIN", "TIBC", "IRON", "RETICCTPCT" in the last 72 hours.   Radiology: [facility] Chest Port 1 View  Result Date: 01/05/2023 CLINICAL DATA:  Palpitations EXAM: PORTABLE CHEST 1 VIEW COMPARISON:  09/08/2021 FINDINGS: Single chamber pacer into the  right ventricle. Normal heart size. Aortic tortuosity. There is no edema, consolidation, effusion, or pneumothorax. IMPRESSION: No evidence of active disease. Electronically Signed   By: Tiburcio Pea M.D.   On: 01/05/2023 04:22    cMRI 05/10/2022 Cardiac MRI:   1. The left ventricle is normal in cavity size. There is severe concentric LV hypertrophy. There is mild to  moderate LV systolic dysfunction. The LV ejection fraction is 43%. The basal to mid lateral and inferior  walls are severely hypokinetic. The rest of the walls are mildly hypokinetic.   2. The right ventricle is normal in cavity size, wall thickness, and systolic function.   3. Both atria are normal in size.   4. The aortic valve is trileaflet in morphology. There is restricted mobility of the leaflets with mild  aortic stenosis. The peak velocity is 2.8 m/s. The aortic valve area measured 2.5 cm2. There is mild aortic  valve regurgitation.   5. Delayed enhancement imaging demonstrates is abnormal. Ther are two types of hyperenhancement (ischemic  and non-ischemic):   a. There is subendocardial hyperenhancement (25% transmural) involving the mid-basal lateral wall,  consistent with myocardial infarction in the distribution of the LCX.   b. There is faint patchy midwall hyperenhancement in the basal septal wall and the RV/septal insertions.  This pattern is non-specific.   * All the LV walls and segemnts are viable.   6.  Regadenoson perfusion imaging demonstrates no evidence of inducible myocardial ischemia.    7. There is no evidence of an intracardiac thrombus.   8. The pericardium is normal in thickness.  There is no pericardial effusion.   TELEMETRY reviewed by me (LT) 01/05/2023 : AF rate 90s low 100s  EKG reviewed by me: AF 110s incomplete LBBB   Data reviewed by me (LT) 01/05/2023: last cardiology clinic note, ed note, admission H&P, last 24h vitals tele labs imaging I/O    Principal Problem:   Atrial  fibrillation with RVR (HCC) Active Problems:   Essential (primary)  hypertension   HLD (hyperlipidemia)   Cardiomyopathy (HCC)   Chronic combined systolic and diastolic heart failure (HCC)   Automatic implantable cardioverter-defibrillator in situ   Controlled type 2 diabetes mellitus with complication, without long-term current use of insulin (HCC)   Elevated troponin    ASSESSMENT AND PLAN:  Adison Larowe. Lamprecht is a 72yoM with a PMH of NICM (last EF by cMRI 43% 04/2022), s/p primary prevention [employer] ICD, paroxysmal atrial fibrillation (Xarelto, s/p successful DCCV 2016), DM2, HTN, OSA with intermittent CPAP compliance who presented to [facility] ED the early morning hours with heart racing and palpitations, was found to be in AF with RVR for which cardiology is consulted for further assistance.  # Paroxysmal AF with RVR Presents symptomatic with palpitations and lightheadedness, heart rate initially 160s-190s per EMS, rate controlled after IV diltiazem 10 mg x 1.  No known sick contacts, although the patient had some general malaise and fatigue yesterday morning.  Also intermittently compliant with his CPAP which may be contributory.  His last known episode of AF was during a 2-week hospitalization in 2016 when his cardiomyopathy was first diagnosed.  Currently in rate controlled AF in the 90s-low 110s on his home carvedilol 25 mg twice a day. -Will continue Coreg as ordered -Continue Xarelto 20 mg daily for stroke prevention.  CHA2DS2-VASc 4 (age, CHF, HTN, DM2)  -Discussed rationale for rate versus rhythm control and consideration for DCCV with the patient and his wife.  They prefer to avoid DCCV at this time and are agreeable to start amiodarone for rhythm control.  Start amiodarone 400 mg twice a day x 10 days for an 8 g load, then 200 mg daily thereafter -Monitor and replenish electrolytes for goal K >4, mag >2 -Baseline TSH, free T4, and LFTs all within normal limits. -Echocardiogram  complete - anticipate discharge readiness form a cardiac perspective tomorrow AM. Recommend follow up with his regular cardiology team at [facility] in [place] per patient preference in 1-2 weeks.   # Acute on chronic HFrEF # NICM # S/p primary prevention ICD BNP is elevated to 745 on exam, patient reports some bloating in his abdomen with some slight elevation of JVD while lying at 30 degrees.  He takes torsemide as needed for weight gain, which he took most recently for 1 dose last week.  He is not grossly volume overloaded apart from some abdominal distention on exam, comfortable on room air, but will dose lasix IV x 1 and monitor response. Possible some decompensation in the setting of rapid AF as above.  - continue GDMT with coreg, - hold lisinopril 20mg  BID, farxiga 10mg  daily, hydral 20mg  BID, eplerenone 50mg  once daily for now, likely restart tomorrow AM  # Demand ischemia Troponin borderline elevated and flat trending at 54, 54, which in the absence of chest pain or ischemic EKG changes, and in the setting of A-fib with RVR, this is most consistent with demand/supply mismatch and not ACS   # OSA Admits to intermittent CPAP compliance, recommended nightly use of CPAP.    This patient's plan of care was discussed and created with Dr. Darrold Junker and he is in agreement.  Signed: Rebeca Allegra , PA-C 01/05/2023, 8:22 AM [facility]

## 2023-01-05 NOTE — Assessment & Plan Note (Signed)
SSI  A1C  

## 2023-01-05 NOTE — Assessment & Plan Note (Signed)
Trop in 50s on presentation  No active CP Suspect mild demand ischemia in setting of afib w/ RVR  Trend trop w/ treatment  Follow

## 2023-01-05 NOTE — Assessment & Plan Note (Signed)
Baseline atrial fibrillation w/ HR into 150s at home  Improving s/p dilt gtt w/ HR into low 100s  Cont xarelto  2D ECHO  Formal consult to [facility] cardiology for evaluation  Follow closely

## 2023-01-05 NOTE — ED Provider Notes (Signed)
[facility] Provider Note    Event Date/Time   First MD Initiated Contact with Patient 01/05/23 [phone]     (approximate)   History   Palpitations   HPI  Robert Grant is a 67 y.o. male brought to the ED via EMS from home with a chief complaint of palpitations.  Patient with a history of atrial fibrillation on Xarelto, CHF, AICD who reports palpitations for the past 2.5 hours while watching videos on his phone.  Denies recent fever/chills, cough, chest pain, shortness of breath, abdominal pain, nausea, vomiting or dizziness.  Denies excessive caffeine use.  EMS reports patient in A-fib with RVR with heart rate of 1 50-1 90 upon their arrival.  Gave 10 mg IV Cardizem with improvement in heart rate, now 90s to 120s.     Past Medical History   Past Medical History:  Diagnosis Date   A-fib [facility])    AICD (automatic cardioverter/defibrillator) present 07/2015   AF, CHF   CHF (congestive heart failure) (HCC)    Controlled type 2 diabetes mellitus with complication, without long-term current use of insulin (HCC) 01/14/2022   Dysrhythmia    Afib   Family history of adverse reaction to anesthesia    Heart failure (HCC)    History of kidney stones    Hyperlipemia    Hypertension    Sleep apnea    uses cpap     Active Problem List   Patient Active Problem List   Diagnosis Date Noted   Controlled type 2 diabetes mellitus with complication, without long-term current use of insulin (HCC) 01/14/2022   Acute hearing loss, right 01/14/2022   Pseudophakia of right eye 02/23/2017   Obstructive sleep apnea 01/21/2016   Automatic implantable cardioverter-defibrillator in situ 12/23/2015   Chronic combined systolic and diastolic heart failure (HCC) 04/28/2015   Cardiomyopathy (HCC) 04/22/2015   A-fib (HCC) 04/12/2015   Paroxysmal atrial fibrillation (HCC) 03/28/2015   Basal cell papilloma 02/06/2015   H/O cardiac catheterization 05/16/2014   Essential (primary)  hypertension 11/28/2006   HLD (hyperlipidemia) 11/28/2006     Past Surgical History   Past Surgical History:  Procedure Laterality Date   APPENDECTOMY  2012   COLECTOMY  07/2011   COLONOSCOPY WITH PROPOFOL N/A 02/11/2022   Procedure: COLONOSCOPY WITH PROPOFOL;  Surgeon: Wyline Mood, MD;  Location: [facility] ENDOSCOPY;  Service: Gastroenterology;  Laterality: N/A;   EXTRACORPOREAL SHOCK WAVE LITHOTRIPSY Right 05/17/2019   Procedure: EXTRACORPOREAL SHOCK WAVE LITHOTRIPSY (ESWL);  Surgeon: Vanna Scotland, MD;  Location: [facility] ORS;  Service: Urology;  Laterality: Right;   Implant of defibrillator Left 08-22-15   KNEE ARTHROSCOPY Left 10/2018   torn meniscus,  workers comp   KNEE SURGERY Right 2006   TONSILLECTOMY AND ADENOIDECTOMY  1960     Home Medications   Prior to Admission medications   Medication Sig Start Date End Date Taking? Authorizing Provider  atorvastatin (LIPITOR) 40 MG tablet TAKE 1 TABLET (40 MG TOTAL) BY MOUTH ONCE DAILY. 06/14/15   [provider]  carvedilol (COREG) 25 MG tablet Take 25 mg by mouth 2 (two) times daily. 05/16/18   [provider]  dapagliflozin propanediol (FARXIGA) 10 MG TABS tablet Take 1 tablet (10 mg total) by mouth daily. 01/14/22   Alfredia Ferguson, PA-C  eplerenone (INSPRA) 25 MG tablet Take 25 mg by mouth 2 (two) times daily.  05/24/19   [provider]  FLOWFLEX COVID-19 AG HOME TEST KIT See admin instructions. 10/21/21   [provider]  fluticasone (FLONASE) 50 MCG/ACT nasal spray Place 2 sprays into both nostrils daily. 11/03/21   [provider]  hydrALAZINE (APRESOLINE) 10 MG tablet Take 20 mg by mouth. 07/13/22 07/13/23  [provider]  lisinopril (ZESTRIL) 10 MG tablet Take by mouth. 10/06/21   [provider]  rivaroxaban (XARELTO) 20 MG TABS tablet Take 20 mg by mouth daily with supper.    [provider]  torsemide (DEMADEX) 20 MG tablet TAKE 1 TABLETS BY MOUTH ONCE DAILY PRN  weight gain >230 06/14/15   [provider]     Allergies  Niacin and Spironolactone   Family History   Family History  Problem Relation Age of Onset   Diabetes Mother    Hypertension Mother    Kidney disease Mother    Hyperlipidemia Mother    Lung cancer Father    Prostate cancer Father    Congestive Heart Failure Maternal Grandmother    Congestive Heart Failure Paternal Grandmother      Physical Exam  Triage Vital Signs: ED Triage Vitals  Enc Vitals Group     BP      Pulse      Resp      Temp      Temp src      SpO2      Weight      Height      Head Circumference      Peak Flow      Pain Score      Pain Loc      Pain Edu?      Excl. in GC?     Updated Vital Signs: BP 129/79   Pulse (!) 104   Temp (!) 97.5 F (36.4 C) (Oral)   Resp 20   SpO2 96%    General: Awake, mild distress.  CV:  Tachycardiac, irregularly irregular rhythm.  Good peripheral perfusion.  Resp:  Normal effort.  Diminished, otherwise CTAB. Abd:  Nontender.  No distention.  Other:  No thyromegaly.  Bilateral calves are supple without tenderness.   ED Results / Procedures / Treatments  Labs (all labs ordered are listed, but only abnormal results are displayed) Labs Reviewed  COMPREHENSIVE METABOLIC PANEL - Abnormal; Notable for the following components:      Result Value   CO2 21 (*)    Glucose, Bld 212 (*)    Calcium 8.7 (*)    All other components within normal limits  BRAIN NATRIURETIC PEPTIDE - Abnormal; Notable for the following components:   B Natriuretic Peptide 745.2 (*)    All other components within normal limits  TROPONIN I (HIGH SENSITIVITY) - Abnormal; Notable for the following components:   Troponin I (High Sensitivity) 54 (*)    All other components within normal limits  SARS CORONAVIRUS 2 BY RT PCR  CBC WITH DIFFERENTIAL/PLATELET  TSH  T4, FREE  URINALYSIS, ROUTINE W REFLEX MICROSCOPIC  TROPONIN I (HIGH SENSITIVITY)     EKG  ED ECG  REPORT I, Vitali Seibert J, the attending physician, personally viewed and interpreted this ECG.   Date: 01/05/2023  EKG Time: 0350  Rate: 110  Rhythm: atrial fibrillation, rate 110  Axis: Normal  Intervals:none  ST&T Change: Nonspecific    RADIOLOGY I have independently visualized and interpreted patient's x-ray as well as noted the radiology interpretation:  Chest x-ray: No acute cardiopulmonary process  Official radiology report(s): DG Chest Port 1 View  Result Date: 01/05/2023 CLINICAL DATA:  Palpitations EXAM: PORTABLE CHEST  1 VIEW COMPARISON:  09/08/2021 FINDINGS: Single chamber pacer into the right ventricle. Normal heart size. Aortic tortuosity. There is no edema, consolidation, effusion, or pneumothorax. IMPRESSION: No evidence of active disease. Electronically Signed   By: Tiburcio Pea M.D.   On: 01/05/2023 04:22     PROCEDURES:  Critical Care performed: Yes, see critical care procedure note(s)  CRITICAL CARE Performed by: Irean Hong   Total critical care time: 30 minutes  Critical care time was exclusive of separately billable procedures and treating other patients.  Critical care was necessary to treat or prevent imminent or life-threatening deterioration.  Critical care was time spent personally by me on the following activities: development of treatment plan with patient and/or surrogate as well as nursing, discussions with consultants, evaluation of patient's response to treatment, examination of patient, obtaining history from patient or surrogate, ordering and performing treatments and interventions, ordering and review of laboratory studies, ordering and review of radiographic studies, pulse oximetry and re-evaluation of patient's condition.   Marland Kitchen1-3 Lead EKG Interpretation  Performed by: Irean Hong, MD Authorized by: Irean Hong, MD     Interpretation: abnormal     ECG rate:  105   ECG rate assessment: tachycardic     Rhythm: atrial fibrillation      Ectopy: none     Conduction: normal   Comments:     Placed on cardiac monitor to evaluate for arrhythmias    MEDICATIONS ORDERED IN ED: Medications - No data to display   IMPRESSION / MDM / ASSESSMENT AND PLAN / ED COURSE  I reviewed the triage vital signs and the nursing notes.                             67 year old male presenting with palpitations. Differential diagnosis includes, but is not limited to, ACS, aortic dissection, pulmonary embolism, cardiac tamponade, pneumothorax, pneumonia, pericarditis, myocarditis, GI-related causes including esophagitis/gastritis, and musculoskeletal chest wall pain.   I have personally reviewed patient's records and note a cardiology office visit on 10/05/2022 for follow-up chronic cardiac conditions.  Patient's presentation is most consistent with acute presentation with potential threat to life or bodily function.  The patient is on the cardiac monitor to evaluate for evidence of arrhythmia and/or significant heart rate changes.  Will obtain cardiac panel, chest x-ray, COVID swab.  Heart rate currently 90-110s, hemodynamically stable; will continue to monitor and care for patient.  Clinical Course as of 01/05/23 0604  Wed Jan 05, 2023  0600 Updated patient and spouse on all test results.  Patient noted to have 4 beat run of ventricular tachycardia; AICD in place.  With some exertion by having patient sit up in bed, his heart rate jumps from 90s to 120s, and comes down with some rest.  Elevated troponin likely secondary to demand ischemia, however given patient's complex cardiac history will consult hospitalist services for evaluation and admission. [JS]    Clinical Course User Index [JS] Irean Hong, MD     FINAL CLINICAL IMPRESSION(S) / ED DIAGNOSES   Final diagnoses:  Atrial fibrillation with rapid ventricular response (HCC)  Chest pain, unspecified type  V tach (HCC)     Rx / DC Orders   ED Discharge Orders     None         Note:  This document was prepared using Dragon voice recognition software and may include unintentional dictation errors.   Irean Hong,  MD 01/05/23 [phone]

## 2023-01-05 NOTE — Assessment & Plan Note (Signed)
Echo 01/01/2020: MILD LV DYSFUNCTION (LVEF 45%) Appears euvolemic  Repeat 2D ECHO pending  Cont diuretic regimen pending cardiology evaluation   

## 2023-01-05 NOTE — Assessment & Plan Note (Signed)
?   Statin.

## 2023-01-05 NOTE — Assessment & Plan Note (Deleted)
Echo 01/01/2020: MILD LV DYSFUNCTION (LVEF 45%) Appears euvolemic  Repeat 2D ECHO pending  Cont diuretic regimen pending cardiology evaluation

## 2023-01-05 NOTE — Assessment & Plan Note (Signed)
Had ICD interrogation 07/19/2022  w/ stable single chamber ICD battery and lead function Follow up cardiology recommendations

## 2023-01-05 NOTE — ED Triage Notes (Signed)
Arrived via EMS from home. Patient reports palpitations, history of atrial fibrillation. Patient initially in afib RVR with rate of 150-190. 18 G right forearm. 10 mg cardizem IV. Patient now afib with rate 80-120. AOX4. Resp even, unlabored on RA. Denies chest pain or shortness of breath.

## 2023-01-05 NOTE — Progress Notes (Signed)
*  PRELIMINARY RESULTS* Echocardiogram 2D Echocardiogram has been performed.  Carolyne Fiscal 01/05/2023, 1:00 PM

## 2023-01-05 NOTE — Assessment & Plan Note (Signed)
BP stable Titrate home regimen 

## 2023-01-05 NOTE — H&P (Signed)
History and Physical    Patient: Robert Grant KGM:[id number] DOB: [birth year] DOA: 01/05/2023 DOS: the patient was seen and examined on 01/05/2023 PCP: Alfredia Ferguson, PA-C  Patient coming from: Home  Chief Complaint:  Chief Complaint  Patient presents with   Palpitations   HPI: Robert Grant is a 67 y.o. male with medical history significant of atrial fibrillation on Xarelto, AICD placement, chronic combined systolic and diastolic heart failure, type 2 diabetes, hypertension presenting with A-fib with RVR.  Patient reports having significant palpitations since around last night.  Woke up this morning with significant palpitations with heart rate into the 150s at home.  No fevers or chills.  No nausea or vomiting.  Patient denies any missed doses of medication.  Denies any caffeine intake or significant stressors.  No orthopnea or PND.  No lower extremity swelling.  No nausea or vomiting.  No hemiparesis or confusion.  Noted to be followed by [facility] cardiology in the outpatient setting. Presented to the ER afebrile, heart rate into the 150s, BP stable satting well on room air.  Heart rate improved to the low 100s on Dilt drip.  EKG with heart rate into the 110s with A-fib, noted incomplete left bundle branch block.  White count 8.7, hemoglobin 14.5, platelets 165, troponin 54 x 2, creatinine 1.02 and BNP 745.  COVID flu RSV negative.  Chest x-ray stable. Review of Systems: As mentioned in the history of present illness. All other systems reviewed and are negative. Past Medical History:  Diagnosis Date   A-fib [facility])    AICD (automatic cardioverter/defibrillator) present 07/2015   AF, CHF   CHF (congestive heart failure) (HCC)    Controlled type 2 diabetes mellitus with complication, without long-term current use of insulin (HCC) 01/14/2022   Dysrhythmia    Afib   Family history of adverse reaction to anesthesia    Heart failure (HCC)    History of kidney stones    Hyperlipemia     Hypertension    Sleep apnea    uses cpap   Past Surgical History:  Procedure Laterality Date   APPENDECTOMY  2012   COLECTOMY  07/2011   COLONOSCOPY WITH PROPOFOL N/A 02/11/2022   Procedure: COLONOSCOPY WITH PROPOFOL;  Surgeon: Wyline Mood, MD;  Location: [facility] ENDOSCOPY;  Service: Gastroenterology;  Laterality: N/A;   EXTRACORPOREAL SHOCK WAVE LITHOTRIPSY Right 05/17/2019   Procedure: EXTRACORPOREAL SHOCK WAVE LITHOTRIPSY (ESWL);  Surgeon: Vanna Scotland, MD;  Location: [facility] ORS;  Service: Urology;  Laterality: Right;   Implant of defibrillator Left 08-22-15   KNEE ARTHROSCOPY Left 10/2018   torn meniscus,  workers comp   KNEE SURGERY Right 2006   TONSILLECTOMY AND ADENOIDECTOMY  1960   Social History:  reports that he quit smoking about 40 years ago. His smoking use included cigarettes. He has a 22.00 pack-year smoking history. He has never used smokeless tobacco. He reports that he does not drink alcohol and does not use drugs.  Allergies  Allergen Reactions   Niacin Hives    Other reaction(s): Other (See Comments) Hot flashes   Spironolactone Other (See Comments)    gynecomastia    Family History  Problem Relation Age of Onset   Diabetes Mother    Hypertension Mother    Kidney disease Mother    Hyperlipidemia Mother    Lung cancer Father    Prostate cancer Father    Congestive Heart Failure Maternal Grandmother    Congestive Heart Failure Paternal Grandmother     Prior  to Admission medications   Medication Sig Start Date End Date Taking? Authorizing Provider  atorvastatin (LIPITOR) 40 MG tablet Take 40 mg by mouth daily. 06/14/15  Yes [provider]  carvedilol (COREG) 25 MG tablet Take 25 mg by mouth 2 (two) times daily. 05/16/18  Yes [provider]  dapagliflozin propanediol (FARXIGA) 10 MG TABS tablet Take 1 tablet (10 mg total) by mouth daily. 01/14/22  Yes Drubel, Lillia Abed, PA-C  eplerenone (INSPRA) 25 MG tablet Take 25 mg by mouth 2 (two) times  daily.  05/24/19  Yes [provider]  FLOWFLEX COVID-19 AG HOME TEST KIT See admin instructions. 10/21/21  Yes [provider]  fluticasone (FLONASE) 50 MCG/ACT nasal spray Place 2 sprays into both nostrils daily. 11/03/21  Yes [provider]  hydrALAZINE (APRESOLINE) 10 MG tablet Take 20 mg by mouth 4 (four) times daily. 07/13/22 07/13/23 Yes [provider]  lisinopril (ZESTRIL) 10 MG tablet Take 10 mg by mouth daily. 10/06/21  Yes [provider]  rivaroxaban (XARELTO) 20 MG TABS tablet Take 20 mg by mouth daily with supper.   Yes [provider]  torsemide (DEMADEX) 20 MG tablet TAKE 1 TABLETS BY MOUTH ONCE DAILY PRN weight gain >230 06/14/15  Yes [provider]    Physical Exam: Vitals:   01/05/23 0351 01/05/23 0630 01/05/23 0700 01/05/23 0730  BP: 129/79 121/79 115/74 (!) 135/90  Pulse: (!) 104 95 (!) 101 (!) 105  Resp: 20 (!) 21 (!) 21 17  Temp: (!) 97.5 F (36.4 C)     TempSrc: Oral     SpO2: 96% 99% 96% 97%   Physical Exam Constitutional:      Appearance: He is obese.  HENT:     Head: Normocephalic.     Nose: Nose normal.     Mouth/Throat:     Mouth: Mucous membranes are moist.  Eyes:     Pupils: Pupils are equal, round, and reactive to light.  Cardiovascular:     Rate and Rhythm: Normal rate and regular rhythm.  Pulmonary:     Effort: Pulmonary effort is normal.  Abdominal:     General: Bowel sounds are normal.  Musculoskeletal:        General: Normal range of motion.  Skin:    General: Skin is warm.  Neurological:     General: No focal deficit present.  Psychiatric:        Mood and Affect: Mood normal.     Data Reviewed:  There are no new results to review at this time. Lab Results  Component Value Date   WBC 8.7 01/05/2023   HGB 14.5 01/05/2023   HCT 44.2 01/05/2023   MCV 92.5 01/05/2023   PLT 165 01/05/2023   Last metabolic panel Lab Results  Component Value Date   GLUCOSE 212 (H)  01/05/2023   NA 139 01/05/2023   K 3.7 01/05/2023   CL 109 01/05/2023   CO2 21 (L) 01/05/2023   BUN 22 01/05/2023   CREATININE 1.02 01/05/2023   GFRNONAA >60 01/05/2023   CALCIUM 8.7 (L) 01/05/2023   PROT 6.5 01/05/2023   ALBUMIN 4.1 01/05/2023   LABGLOB 2.1 04/24/2021   AGRATIO 2.2 04/24/2021   BILITOT 1.0 01/05/2023   ALKPHOS 68 01/05/2023   AST 28 01/05/2023   ALT 23 01/05/2023   ANIONGAP 9 01/05/2023   DG Chest Port 1 View CLINICAL DATA:  Palpitations  EXAM: PORTABLE CHEST 1 VIEW  COMPARISON:  09/08/2021  FINDINGS: Single chamber  pacer into the right ventricle. Normal heart size. Aortic tortuosity. There is no edema, consolidation, effusion, or pneumothorax.  IMPRESSION: No evidence of active disease.  Electronically Signed   By: Tiburcio Pea M.D.   On: 01/05/2023 04:22   Assessment and Plan: * Atrial fibrillation with RVR (HCC) Baseline atrial fibrillation w/ HR into 150s at home  Improving s/p dilt gtt w/ HR into low 100s  Cont xarelto  2D ECHO  Formal consult to [facility] cardiology for evaluation  Follow closely   Elevated troponin Trop in 50s on presentation  No active CP Suspect mild demand ischemia in setting of afib w/ RVR  Trend trop w/ treatment  Follow    Controlled type 2 diabetes mellitus with complication, without long-term current use of insulin (HCC) SSI  A1C   Automatic implantable cardioverter-defibrillator in situ Had ICD interrogation 07/19/2022  w/ stable single chamber ICD battery and lead function Follow up cardiology recommendations    Cardiomyopathy (HCC) Echo 01/01/2020: MILD LV DYSFUNCTION (LVEF 45%) Appears euvolemic  Repeat 2D ECHO pending  Cont diuretic regimen pending cardiology evaluation   HLD (hyperlipidemia) Statin    Essential (primary) hypertension BP stable  Titrate home regimen       Advance Care Planning:   Code Status: Full Code   Consults: [facility] w Dr. Darrold Junker.   Family  Communication: Wife at the bedside   Severity of Illness: The appropriate patient status for this patient is OBSERVATION. Observation status is judged to be reasonable and necessary in order to provide the required intensity of service to ensure the patient's safety. The patient's presenting symptoms, physical exam findings, and initial radiographic and laboratory data in the context of their medical condition is felt to place them at decreased risk for further clinical deterioration. Furthermore, it is anticipated that the patient will be medically stable for discharge from the hospital within 2 midnights of admission.   Author: Floydene Flock, MD 01/05/2023 8:04 AM  For on call review [email or web address].

## 2023-01-06 DIAGNOSIS — I4891 Unspecified atrial fibrillation: Secondary | ICD-10-CM | POA: Diagnosis not present

## 2023-01-06 LAB — CBC
HCT: 43.6 % (ref 39.0–52.0)
Hemoglobin: 13.8 g/dL (ref 13.0–17.0)
MCH: 29.6 pg (ref 26.0–34.0)
MCHC: 31.7 g/dL (ref 30.0–36.0)
MCV: 93.6 fL (ref 80.0–100.0)
Platelets: 144 10*3/uL — ABNORMAL LOW (ref 150–400)
RBC: 4.66 MIL/uL (ref 4.22–5.81)
RDW: 14.3 % (ref 11.5–15.5)
WBC: 7.3 10*3/uL (ref 4.0–10.5)
nRBC: 0 % (ref 0.0–0.2)

## 2023-01-06 LAB — COMPREHENSIVE METABOLIC PANEL
ALT: 22 U/L (ref 0–44)
AST: 25 U/L (ref 15–41)
Albumin: 3.9 g/dL (ref 3.5–5.0)
Alkaline Phosphatase: 46 U/L (ref 38–126)
Anion gap: 10 (ref 5–15)
BUN: 27 mg/dL — ABNORMAL HIGH (ref 8–23)
CO2: 25 mmol/L (ref 22–32)
Calcium: 8.5 mg/dL — ABNORMAL LOW (ref 8.9–10.3)
Chloride: 104 mmol/L (ref 98–111)
Creatinine, Ser: 1.08 mg/dL (ref 0.61–1.24)
GFR, Estimated: >60 mL/min (ref >60)
Glucose, Bld: 159 mg/dL — ABNORMAL HIGH (ref 70–99)
Potassium: 4 mmol/L (ref 3.5–5.1)
Sodium: 139 mmol/L (ref 135–145)
Total Bilirubin: 1.2 mg/dL (ref 0.3–1.2)
Total Protein: 6.2 g/dL — ABNORMAL LOW (ref 6.5–8.1)

## 2023-01-06 MED ORDER — AMIODARONE HCL 400 MG PO TABS: 400.0000 mg | ORAL_TABLET | Freq: Two times a day (BID) | ORAL | 0 refills | Status: DC

## 2023-01-06 MED ORDER — AMIODARONE HCL 200 MG PO TABS: 200.0000 mg | ORAL_TABLET | Freq: Every day | ORAL | 0 refills | Status: DC

## 2023-01-06 NOTE — Progress Notes (Signed)
[facility] CLINIC CARDIOLOGY CONSULT NOTE       Patient ID: Robert Grant MRN: [id number] DOB/AGE: Dec 05, 1955 67 y.o.  Admit date: 01/05/2023 Referring Physician Dr Doree Albee  Primary Physician Arvella Nigh, PA-C  Primary Cardiologist Dr. Darin Engels ([facility])  Reason for Consultation paroxysmal AF RVR  HPI: Robert Grant is a 67yoM with a PMH of NICM (last EF by cMRI 43% 04/2022), s/p primary prevention [employer] ICD, paroxysmal atrial fibrillation (Xarelto, s/p successful DCCV 2016), DM2, HTN, OSA with intermittent CPAP compliance who presented to [facility] ED the early morning hours with heart racing and palpitations, was found to be in AF with RVR for which cardiology is consulted for further assistance.  Interval History:  -Feels much better today, in sinus rhythm on telemetry with rate in the 70s to 80s with frequent PVCs -Diuresed well with a single dose of IV Lasix 40 mg -No chest pain or shortness of breath, heart racing or palpitations. -Echo resulted with a slightly reduced EF to 30-35% with mild MR, and mild MS, although the patient was in atrial fibrillation at the time of the study.   Review of systems complete and found to be negative unless listed above     Past Medical History:  Diagnosis Date   A-fib [facility])    AICD (automatic cardioverter/defibrillator) present 07/2015   AF, CHF   CHF (congestive heart failure) (HCC)    Controlled type 2 diabetes mellitus with complication, without long-term current use of insulin (HCC) 01/14/2022   Dysrhythmia    Afib   Family history of adverse reaction to anesthesia    Heart failure (HCC)    History of kidney stones    Hyperlipemia    Hypertension    Sleep apnea    uses cpap    Past Surgical History:  Procedure Laterality Date   APPENDECTOMY  2012   COLECTOMY  07/2011   COLONOSCOPY WITH PROPOFOL N/A 02/11/2022   Procedure: COLONOSCOPY WITH PROPOFOL;  Surgeon: Wyline Mood, MD;  Location: [facility] ENDOSCOPY;  Service:  Gastroenterology;  Laterality: N/A;   EXTRACORPOREAL SHOCK WAVE LITHOTRIPSY Right 05/17/2019   Procedure: EXTRACORPOREAL SHOCK WAVE LITHOTRIPSY (ESWL);  Surgeon: Vanna Scotland, MD;  Location: [facility] ORS;  Service: Urology;  Laterality: Right;   Implant of defibrillator Left 08-22-15   KNEE ARTHROSCOPY Left 10/2018   torn meniscus,  workers comp   KNEE SURGERY Right 2006   TONSILLECTOMY AND ADENOIDECTOMY  1960    (Not in a hospital admission)  Social History   Socioeconomic History   Marital status: Married    Spouse name: Kendal Hymen   Number of children: Not on file   Years of education: Not on file   Highest education level: Not on file  Occupational History   Occupation: out on workers comp d/t knee injury  Tobacco Use   Smoking status: Former    Packs/day: 2.00    Years: 11.00    Additional pack years: 0.00    Total pack years: 22.00    Types: Cigarettes    Quit date: 08/1982    Years since quitting: 40.3   Smokeless tobacco: Never  Vaping Use   Vaping Use: Never used  Substance and Sexual Activity   Alcohol use: No    Alcohol/week: 0.0 standard drinks of alcohol   Drug use: No   Sexual activity: Yes    Birth control/protection: None  Other Topics Concern   Not on file  Social History Narrative   Not on file  Social Determinants of Health   Financial Resource Strain: Not on file  Food Insecurity: No Food Insecurity (01/05/2023)   Hunger Vital Sign    Worried About Running Out of Food in the Last Year: Never true    Ran Out of Food in the Last Year: Never true  Transportation Needs: No Transportation Needs (01/05/2023)   PRAPARE - Administrator, Civil Service (Medical): No    Lack of Transportation (Non-Medical): No  Physical Activity: Not on file  Stress: Not on file  Social Connections: Not on file  Intimate Partner Violence: Not At Risk (01/05/2023)   Humiliation, Afraid, Rape, and Kick questionnaire    Fear of Current or Ex-Partner: No     Emotionally Abused: No    Physically Abused: No    Sexually Abused: No    Family History  Problem Relation Age of Onset   Diabetes Mother    Hypertension Mother    Kidney disease Mother    Hyperlipidemia Mother    Lung cancer Father    Prostate cancer Father    Congestive Heart Failure Maternal Grandmother    Congestive Heart Failure Paternal Grandmother       Intake/Output Summary (Last 24 hours) at 01/06/2023 0854 Last data filed at 01/06/2023 0829 Gross per 24 hour  Intake 1163.72 ml  Output 1550 ml  Net -386.28 ml    Vitals:   01/05/23 2207 01/06/23 0140 01/06/23 0454 01/06/23 0833  BP: 132/81 139/86 130/89 133/83  Pulse: 70 64 62 70  Resp: (!) 22 18 16  (!) 21  Temp: 98.1 F (36.7 C)  98 F (36.7 C)   TempSrc: Oral  Oral   SpO2: 95% 98% 98% 95%    PHYSICAL EXAM General: pleasant caucasian male, well nourished, in no acute distress. Sitting up in bed with wife at bedside HEENT:  Normocephalic and atraumatic. Neck:  No JVD Lungs: Normal respiratory effort on room air.  Clear to auscultation bilaterally  heart: Regular rate and rhythm. Normal S1 and S2 without gallops or murmurs.  Abdomen: mildly distended with excess adiposity  Msk: Normal strength and tone for age. Extremities: Warm and well perfused. No clubbing, cyanosis. No peripheral edema.  Neuro: Alert and oriented X 3. Psych:  Answers questions appropriately.   Labs: Basic Metabolic Panel: Recent Labs    01/05/23 0354 01/06/23 0450  NA 139 139  K 3.7 4.0  CL 109 104  CO2 21* 25  GLUCOSE 212* 159*  BUN 22 27*  CREATININE 1.02 1.08  CALCIUM 8.7* 8.5*    Liver Function Tests: Recent Labs    01/05/23 0354 01/06/23 0450  AST 28 25  ALT 23 22  ALKPHOS 68 46  BILITOT 1.0 1.2  PROT 6.5 6.2*  ALBUMIN 4.1 3.9    No results for input(s): "LIPASE", "AMYLASE" in the last 72 hours. CBC: Recent Labs    01/05/23 0354 01/06/23 0450  WBC 8.7 7.3  NEUTROABS 4.1  --   HGB 14.5 13.8  HCT 44.2  43.6  MCV 92.5 93.6  PLT 165 144*    Cardiac Enzymes: Recent Labs    01/05/23 0354 01/05/23 0552  TROPONINIHS 54* 54*    BNP: Recent Labs    01/05/23 0354  BNP 745.2*    D-Dimer: No results for input(s): "DDIMER" in the last 72 hours. Hemoglobin A1C: No results for input(s): "HGBA1C" in the last 72 hours. Fasting Lipid Panel: No results for input(s): "CHOL", "HDL", "LDLCALC", "TRIG", "CHOLHDL", "LDLDIRECT" in the  last 72 hours. Thyroid Function Tests: Recent Labs    01/05/23 0354  TSH 3.674    Anemia Panel: No results for input(s): "VITAMINB12", "FOLATE", "FERRITIN", "TIBC", "IRON", "RETICCTPCT" in the last 72 hours.   Radiology: ECHOCARDIOGRAM COMPLETE  Result Date: 01/05/2023    ECHOCARDIOGRAM REPORT   Patient Name:   Robert Grant Date of Exam: 01/05/2023 Medical Rec #:  [id number]       Height:       69.0 in Accession #:    [id number]      Weight:       226.8 lb Date of Birth:  [birth year]        BSA:          2.179 m Patient Age:    66 years        BP:           135/90 mmHg Patient Gender: M               HR:           113 bpm. Exam Location:  [facility] Procedure: 2D Echo, Cardiac Doppler, Color Doppler and Intracardiac            Opacification Agent Indications:     Atrial Fibrillation  History:         Patient has no prior history of Echocardiogram examinations.                  Cardiomyopathy and CHF, Defibrillator, Arrythmias:Atrial                  Fibrillation; Risk Factors:Sleep Apnea, Diabetes and                  Dyslipidemia.  Sonographer:     Mikki Harbor Referring Phys:  [phone] Francoise Schaumann [place] Diagnosing Phys: Marcina Millard MD  Sonographer Comments: Technically difficult study due to poor echo windows. IMPRESSIONS  1. Left ventricular ejection fraction, by estimation, is 30 to 35%. The left ventricle has moderately decreased function. The left ventricle has no regional wall motion abnormalities. Indeterminate diastolic filling due to E-A fusion.  2. Right  ventricular systolic function is normal. The right ventricular size is normal. There is normal pulmonary artery systolic pressure.  3. The mitral valve is normal in structure. Mild mitral valve regurgitation. No evidence of mitral stenosis.  4. The aortic valve is normal in structure. Aortic valve regurgitation is not visualized. Mild aortic valve stenosis.  5. The inferior vena cava is normal in size with greater than 50% respiratory variability, suggesting right atrial pressure of 3 mmHg. FINDINGS  Left Ventricle: Left ventricular ejection fraction, by estimation, is 30 to 35%. The left ventricle has moderately decreased function. The left ventricle has no regional wall motion abnormalities. Definity contrast agent was given IV to delineate the left ventricular endocardial borders. The left ventricular internal cavity size was normal in size. There is no left ventricular hypertrophy. Indeterminate diastolic filling due to E-A fusion. Right Ventricle: The right ventricular size is normal. No increase in right ventricular wall thickness. Right ventricular systolic function is normal. There is normal pulmonary artery systolic pressure. The tricuspid regurgitant velocity is 2.13 m/s, and  with an assumed right atrial pressure of 8 mmHg, the estimated right ventricular systolic pressure is 26.1 mmHg. Left Atrium: Left atrial size was normal in size. Right Atrium: Right atrial size was normal in size. Pericardium: There is no evidence of pericardial effusion. Mitral Valve: The mitral valve is  normal in structure. Mild mitral valve regurgitation. No evidence of mitral valve stenosis. MV peak gradient, 6.6 mmHg. The mean mitral valve gradient is 3.0 mmHg. Tricuspid Valve: The tricuspid valve is normal in structure. Tricuspid valve regurgitation is mild . No evidence of tricuspid stenosis. Aortic Valve: The aortic valve is normal in structure. Aortic valve regurgitation is not visualized. Mild aortic stenosis is present.  Aortic valve mean gradient measures 8.7 mmHg. Aortic valve peak gradient measures 16.2 mmHg. Aortic valve area, by VTI measures 1.38 cm. Pulmonic Valve: The pulmonic valve was normal in structure. Pulmonic valve regurgitation is not visualized. No evidence of pulmonic stenosis. Aorta: The aortic root is normal in size and structure. Venous: The inferior vena cava is normal in size with greater than 50% respiratory variability, suggesting right atrial pressure of 3 mmHg. IAS/Shunts: No atrial level shunt detected by color flow Doppler.  LEFT VENTRICLE PLAX 2D LVIDd:         6.40 cm LVIDs:         5.50 cm LV PW:         1.20 cm LV IVS:        1.20 cm LVOT diam:     2.00 cm LV SV:         51 LV SV Index:   23 LVOT Area:     3.14 cm  RIGHT VENTRICLE RV Basal diam:  4.00 cm RV Mid diam:    3.50 cm RV S prime:     12.70 cm/s LEFT ATRIUM             Index        RIGHT ATRIUM           Index LA diam:        3.70 cm 1.70 cm/m   RA Area:     18.90 cm LA Vol (A2C):   59.4 ml 27.25 ml/m  RA Volume:   54.60 ml  25.05 ml/m LA Vol (A4C):   37.3 ml 17.11 ml/m LA Biplane Vol: 47.0 ml 21.57 ml/m  AORTIC VALVE                     PULMONIC VALVE AV Area (Vmax):    1.39 cm      PV Vmax:       0.91 m/s AV Area (Vmean):   1.22 cm      PV Peak grad:  3.3 mmHg AV Area (VTI):     1.38 cm AV Vmax:           201.33 cm/s AV Vmean:          136.000 cm/s AV VTI:            0.370 m AV Peak Grad:      16.2 mmHg AV Mean Grad:      8.7 mmHg LVOT Vmax:         89.13 cm/s LVOT Vmean:        52.933 cm/s LVOT VTI:          0.162 m LVOT/AV VTI ratio: 0.44  AORTA Ao Root diam: 3.40 cm Ao Asc diam:  3.50 cm MITRAL VALVE                TRICUSPID VALVE MV Area (PHT): 4.26 cm     TR Peak grad:   18.1 mmHg MV Area VTI:   2.54 cm     TR Vmax:        213.00 cm/s  MV Peak grad:  6.6 mmHg MV Mean grad:  3.0 mmHg     SHUNTS MV Vmax:       1.28 m/s     Systemic VTI:  0.16 m MV Vmean:      79.7 cm/s    Systemic Diam: 2.00 cm MV Decel Time: 178 msec MV E  velocity: 128.00 cm/s Marcina Millard MD Electronically signed by Marcina Millard MD Signature Date/Time: 01/05/2023/5:15:17 PM    Final    DG Chest Port 1 View  Result Date: 01/05/2023 CLINICAL DATA:  Palpitations EXAM: PORTABLE CHEST 1 VIEW COMPARISON:  09/08/2021 FINDINGS: Single chamber pacer into the right ventricle. Normal heart size. Aortic tortuosity. There is no edema, consolidation, effusion, or pneumothorax. IMPRESSION: No evidence of active disease. Electronically Signed   By: Tiburcio Pea M.D.   On: 01/05/2023 04:22    cMRI 05/10/2022 Cardiac MRI:   1. The left ventricle is normal in cavity size. There is severe concentric LV hypertrophy. There is mild to  moderate LV systolic dysfunction. The LV ejection fraction is 43%. The basal to mid lateral and inferior  walls are severely hypokinetic. The rest of the walls are mildly hypokinetic.   2. The right ventricle is normal in cavity size, wall thickness, and systolic function.   3. Both atria are normal in size.   4. The aortic valve is trileaflet in morphology. There is restricted mobility of the leaflets with mild  aortic stenosis. The peak velocity is 2.8 m/s. The aortic valve area measured 2.5 cm2. There is mild aortic  valve regurgitation.   5. Delayed enhancement imaging demonstrates is abnormal. Ther are two types of hyperenhancement (ischemic  and non-ischemic):   a. There is subendocardial hyperenhancement (25% transmural) involving the mid-basal lateral wall,  consistent with myocardial infarction in the distribution of the LCX.   b. There is faint patchy midwall hyperenhancement in the basal septal wall and the RV/septal insertions.  This pattern is non-specific.   * All the LV walls and segemnts are viable.   6.  Regadenoson perfusion imaging demonstrates no evidence of inducible myocardial ischemia.    7. There is no evidence of an intracardiac thrombus.   8. The pericardium is normal in thickness.   There is no pericardial effusion.   TELEMETRY reviewed by me (LT) 01/06/2023 : NSR rate 70s to 80s with frequent PVCs  EKG reviewed by me: AF 110s incomplete LBBB   Data reviewed by me (LT) 01/06/2023: Hospitalist progress note last 24h vitals tele labs imaging I/O    Principal Problem:   Atrial fibrillation with RVR (HCC) Active Problems:   Essential (primary) hypertension   HLD (hyperlipidemia)   Cardiomyopathy (HCC)   Chronic combined systolic and diastolic heart failure (HCC)   Automatic implantable cardioverter-defibrillator in situ   Controlled type 2 diabetes mellitus with complication, without long-term current use of insulin (HCC)   Elevated troponin    ASSESSMENT AND PLAN:  Robert Grant is a 62yoM with a PMH of NICM (last EF by cMRI 43% 04/2022), s/p primary prevention [employer] ICD, paroxysmal atrial fibrillation (Xarelto, s/p successful DCCV 2016), DM2, HTN, OSA with intermittent CPAP compliance who presented to [facility] ED the early morning hours with heart racing and palpitations, was found to be in AF with RVR for which cardiology is consulted for further assistance.  # Paroxysmal AF with RVR Presents symptomatic with palpitations and lightheadedness, heart rate initially 160s-190s per EMS, rate controlled after IV diltiazem 10 mg x  1.  No known sick contacts, although the patient had some general malaise and fatigue yesterday morning.  Also intermittently compliant with his CPAP which may be contributory.  His last known episode of AF was during a 2-week hospitalization in 2016 when his cardiomyopathy was first diagnosed.  Converted to NSR overnight -continue Coreg 25 mg twice a day -Continue Xarelto 20 mg daily for stroke prevention.  CHA2DS2-VASc 4 (age, CHF, HTN, DM2)  -Continue amiodarone 400 mg twice a day x 10 days for an 8 g load, then 200 mg daily thereafter -Monitor and replenish electrolytes for goal K >4, mag >2 -Baseline TSH, free T4, and LFTs all within  normal limits. -Okay for discharge today from a cardiac perspective.  Recommend follow up with his regular cardiology team at [facility] in [place] per patient preference in 1-2 weeks.   # Acute on chronic HFrEF # NICM # S/p primary prevention ICD BNP is elevated to 745 on exam, initially patient reported some bloating in his abdomen with some slight elevation of JVD while lying at 30 degrees.  He takes torsemide as needed for weight gain, which he took most recently for 1 dose last week.  Diuresed well with a single dose of IV Lasix.  Clinically euvolemic on exam today - continue GDMT with coreg, - Resume lisinopril 20mg  BID, farxiga 10mg  daily, hydral 20mg  BID, eplerenone 50mg  once daily at discharge.  # Demand ischemia Troponin borderline elevated and flat trending at 54, 54, which in the absence of chest pain or ischemic EKG changes, and in the setting of A-fib with RVR, this is most consistent with demand/supply mismatch and not ACS   # OSA Admits to intermittent CPAP compliance, recommended nightly use of CPAP.    This patient's plan of care was discussed and created with Dr. Darrold Junker and he is in agreement.  Signed: Rebeca Allegra , PA-C 01/06/2023, 8:54 AM [facility] Cardiology

## 2023-01-06 NOTE — Care Management CC44 (Signed)
Condition Code 44 Documentation Completed  Patient Details  Name: Robert Grant MRN: [id number] Date of Birth: [birth year]   Condition Code 44 given:  Yes Patient signature on Condition Code 44 notice:  Yes Documentation of 2 MD's agreement:  Yes Code 44 added to claim:  Yes    Darolyn Rua, LCSW 01/06/2023, 11:08 AM

## 2023-01-06 NOTE — Care Management Obs Status (Signed)
MEDICARE OBSERVATION STATUS NOTIFICATION   Patient Details  Name: Robert Grant MRN: [id number] Date of Birth: [birth year]   Medicare Observation Status Notification Given:  Yes    Darolyn Rua, LCSW 01/06/2023, 11:08 AM

## 2023-01-06 NOTE — ED Notes (Signed)
Pt resting comfortably with eyes closed at this time. Respirations even and unlabored. Monitor on.

## 2023-01-06 NOTE — Discharge Summary (Signed)
Physician Discharge Summary   Patient: Robert Grant MRN: [id number] DOB: [birth year]  Admit date:     01/05/2023  Discharge date: 01/06/23  Discharge Physician: Jackeline Gutknecht   PCP: Alfredia Ferguson, PA-C   Recommendations at discharge:   Take medications as recommended Keep scheduled follow-up appointment with cardiology and PCP  Discharge Diagnoses: Principal Problem:   Atrial fibrillation with RVR (HCC) Active Problems:   Chronic combined systolic and diastolic heart failure (HCC)   Elevated troponin   Essential (primary) hypertension   HLD (hyperlipidemia)   Cardiomyopathy (HCC)   Automatic implantable cardioverter-defibrillator in situ   Controlled type 2 diabetes mellitus with complication, without long-term current use of insulin (HCC)  Resolved Problems:   * No resolved hospital problems. *  Hospital Course: Robert Grant is a 67 y.o. male with medical history significant of atrial fibrillation on Xarelto, AICD placement, chronic combined systolic and diastolic heart failure, type 2 diabetes, hypertension presenting with A-fib with RVR.  Patient reports having significant palpitations since around last night.  Woke up this morning with significant palpitations with heart rate into the 150s at home.  No fevers or chills.  No nausea or vomiting.  Patient denies any missed doses of medication.  Denies any caffeine intake or significant stressors.  No orthopnea or PND.  No lower extremity swelling.  No nausea or vomiting.  No hemiparesis or confusion.  Noted to be followed by [facility] cardiology in the outpatient setting. Presented to the ER afebrile, heart rate into the 150s, BP stable satting well on room air.  Heart rate improved to the low 100s on Dilt drip.  EKG with heart rate into the 110s with A-fib, noted incomplete left bundle branch block.  White count 8.7, hemoglobin 14.5, platelets 165, troponin 54 x 2, creatinine 1.02 and BNP 745.  COVID flu RSV negative.  Chest x-ray  stable.     Assessment and Plan:  * Atrial fibrillation with RVR (HCC) Baseline atrial fibrillation w/ HR into 150s at home  Was initially placed on diltiazem drip and switched to amiodarone for rhythm control Continue  Xarelto as primary prophylaxis for acute stroke 2D echocardiogram shows an LVEF of 30 to 35% with decreased LV function      Elevated troponin Trop in 50s on presentation  No active CP Suspect mild demand ischemia in setting of afib w/ RVR  2D echocardiogram does not show any evidence of regional wall motion abnormality     Controlled type 2 diabetes mellitus with complication, without long-term current use of insulin (HCC) Blood sugars are stable     Automatic implantable cardioverter-defibrillator in situ Had ICD interrogation 07/19/2022  w/ stable single chamber ICD battery and lead function Follow up cardiology recommendations      Cardiomyopathy [facility] Echo 01/01/2020: MILD LV DYSFUNCTION (LVEF 45%) Appears euvolemic  Repeat 2D echocardiogram shows an LVEF of 30 to 35% Continue lisinopril, Inspra, torsemide and carvedilol   HLD (hyperlipidemia) Statin      Essential (primary) hypertension BP is stable on carvedilol and lisinopril         Consultants: Cardiology Procedures performed: 2D echocardiogram Disposition: Home Diet recommendation:  Discharge Diet Orders (From admission, onward)     Start     Ordered   01/06/23 0000  Diet - low sodium heart healthy        01/06/23 1054           Cardiac diet DISCHARGE MEDICATION: Allergies as of 01/06/2023  Reactions   Niacin Hives   Other reaction(s): Other (See Comments) Hot flashes   Spironolactone Other (See Comments)   gynecomastia        Medication List     STOP taking these medications    Flowflex COVID-19 Ag Home Test Kit Generic drug: COVID-19 At Home Antigen Test       TAKE these medications    amiodarone 400 MG tablet Commonly known as: PACERONE Take  1 tablet (400 mg total) by mouth 2 (two) times daily for 10 days.   amiodarone 200 MG tablet Commonly known as: PACERONE Take 1 tablet (200 mg total) by mouth daily. Start taking on: Jan 15, 2023   atorvastatin 40 MG tablet Commonly known as: LIPITOR Take 40 mg by mouth daily.   carvedilol 25 MG tablet Commonly known as: COREG Take 25 mg by mouth 2 (two) times daily.   dapagliflozin propanediol 10 MG Tabs tablet Commonly known as: Farxiga Take 1 tablet (10 mg total) by mouth daily.   eplerenone 25 MG tablet Commonly known as: INSPRA Take 25 mg by mouth 2 (two) times daily.   fluticasone 50 MCG/ACT nasal spray Commonly known as: FLONASE Place 2 sprays into both nostrils daily.   hydrALAZINE 10 MG tablet Commonly known as: APRESOLINE Take 20 mg by mouth 4 (four) times daily.   lisinopril 10 MG tablet Commonly known as: ZESTRIL Take 10 mg by mouth daily.   rivaroxaban 20 MG Tabs tablet Commonly known as: XARELTO Take 20 mg by mouth daily with supper.   torsemide 20 MG tablet Commonly known as: DEMADEX TAKE 1 TABLETS BY MOUTH ONCE DAILY PRN weight gain >230        Follow-up Information     Alfredia Ferguson, PA-C Follow up.   Specialty: Physician Assistant Contact information: [address] [place] [address] [phone]         Read Drivers, Valarie Merino, MD Follow up.   Specialty: Cardiology Contact information: [address] [facility] [address]/2G [facility] [address] [place] [address] [phone]                Discharge Exam: There were no vitals filed for this visit. Constitutional:      Appearance: He is obese.  HENT:     Head: Normocephalic.     Nose: Nose normal.     Mouth/Throat:     Mouth: Mucous membranes are moist.  Eyes:     Pupils: Pupils are equal, round, and reactive to light.  Cardiovascular:     Rate and Rhythm: Normal rate and regular rhythm.  Pulmonary:     Effort: Pulmonary effort is normal.  Abdominal:      General: Bowel sounds are normal.  Musculoskeletal:        General: Normal range of motion.  Skin:    General: Skin is warm.  Neurological:     General: No focal deficit present.  Psychiatric:        Mood and Affect: Mood normal.     Condition at discharge: stable  The results of significant diagnostics from this hospitalization (including imaging, microbiology, ancillary and laboratory) are listed below for reference.   Imaging Studies: ECHOCARDIOGRAM COMPLETE  Result Date: 01/05/2023    ECHOCARDIOGRAM REPORT   Patient Name:   Robert Grant Date of Exam: 01/05/2023 Medical Rec #:  [id number]       Height:       69.0 in Accession #:    [id number]  Weight:       226.8 lb Date of Birth:  July 08, 1956        BSA:          2.179 m Patient Age:    66 years        BP:           135/90 mmHg Patient Gender: M               HR:           113 bpm. Exam Location:  [facility] Procedure: 2D Echo, Cardiac Doppler, Color Doppler and Intracardiac            Opacification Agent Indications:     Atrial Fibrillation  History:         Patient has no prior history of Echocardiogram examinations.                  Cardiomyopathy and CHF, Defibrillator, Arrythmias:Atrial                  Fibrillation; Risk Factors:Sleep Apnea, Diabetes and                  Dyslipidemia.  Sonographer:     Mikki Harbor Referring Phys:  [phone] Francoise Schaumann [place] Diagnosing Phys: Marcina Millard MD  Sonographer Comments: Technically difficult study due to poor echo windows. IMPRESSIONS  1. Left ventricular ejection fraction, by estimation, is 30 to 35%. The left ventricle has moderately decreased function. The left ventricle has no regional wall motion abnormalities. Indeterminate diastolic filling due to E-A fusion.  2. Right ventricular systolic function is normal. The right ventricular size is normal. There is normal pulmonary artery systolic pressure.  3. The mitral valve is normal in structure. Mild mitral valve regurgitation. No evidence  of mitral stenosis.  4. The aortic valve is normal in structure. Aortic valve regurgitation is not visualized. Mild aortic valve stenosis.  5. The inferior vena cava is normal in size with greater than 50% respiratory variability, suggesting right atrial pressure of 3 mmHg. FINDINGS  Left Ventricle: Left ventricular ejection fraction, by estimation, is 30 to 35%. The left ventricle has moderately decreased function. The left ventricle has no regional wall motion abnormalities. Definity contrast agent was given IV to delineate the left ventricular endocardial borders. The left ventricular internal cavity size was normal in size. There is no left ventricular hypertrophy. Indeterminate diastolic filling due to E-A fusion. Right Ventricle: The right ventricular size is normal. No increase in right ventricular wall thickness. Right ventricular systolic function is normal. There is normal pulmonary artery systolic pressure. The tricuspid regurgitant velocity is 2.13 m/s, and  with an assumed right atrial pressure of 8 mmHg, the estimated right ventricular systolic pressure is 26.1 mmHg. Left Atrium: Left atrial size was normal in size. Right Atrium: Right atrial size was normal in size. Pericardium: There is no evidence of pericardial effusion. Mitral Valve: The mitral valve is normal in structure. Mild mitral valve regurgitation. No evidence of mitral valve stenosis. MV peak gradient, 6.6 mmHg. The mean mitral valve gradient is 3.0 mmHg. Tricuspid Valve: The tricuspid valve is normal in structure. Tricuspid valve regurgitation is mild . No evidence of tricuspid stenosis. Aortic Valve: The aortic valve is normal in structure. Aortic valve regurgitation is not visualized. Mild aortic stenosis is present. Aortic valve mean gradient measures 8.7 mmHg. Aortic valve peak gradient measures 16.2 mmHg. Aortic valve area, by VTI measures 1.38 cm. Pulmonic Valve: The pulmonic valve was  normal in structure. Pulmonic valve  regurgitation is not visualized. No evidence of pulmonic stenosis. Aorta: The aortic root is normal in size and structure. Venous: The inferior vena cava is normal in size with greater than 50% respiratory variability, suggesting right atrial pressure of 3 mmHg. IAS/Shunts: No atrial level shunt detected by color flow Doppler.  LEFT VENTRICLE PLAX 2D LVIDd:         6.40 cm LVIDs:         5.50 cm LV PW:         1.20 cm LV IVS:        1.20 cm LVOT diam:     2.00 cm LV SV:         51 LV SV Index:   23 LVOT Area:     3.14 cm  RIGHT VENTRICLE RV Basal diam:  4.00 cm RV Mid diam:    3.50 cm RV S prime:     12.70 cm/s LEFT ATRIUM             Index        RIGHT ATRIUM           Index LA diam:        3.70 cm 1.70 cm/m   RA Area:     18.90 cm LA Vol (A2C):   59.4 ml 27.25 ml/m  RA Volume:   54.60 ml  25.05 ml/m LA Vol (A4C):   37.3 ml 17.11 ml/m LA Biplane Vol: 47.0 ml 21.57 ml/m  AORTIC VALVE                     PULMONIC VALVE AV Area (Vmax):    1.39 cm      PV Vmax:       0.91 m/s AV Area (Vmean):   1.22 cm      PV Peak grad:  3.3 mmHg AV Area (VTI):     1.38 cm AV Vmax:           201.33 cm/s AV Vmean:          136.000 cm/s AV VTI:            0.370 m AV Peak Grad:      16.2 mmHg AV Mean Grad:      8.7 mmHg LVOT Vmax:         89.13 cm/s LVOT Vmean:        52.933 cm/s LVOT VTI:          0.162 m LVOT/AV VTI ratio: 0.44  AORTA Ao Root diam: 3.40 cm Ao Asc diam:  3.50 cm MITRAL VALVE                TRICUSPID VALVE MV Area (PHT): 4.26 cm     TR Peak grad:   18.1 mmHg MV Area VTI:   2.54 cm     TR Vmax:        213.00 cm/s MV Peak grad:  6.6 mmHg MV Mean grad:  3.0 mmHg     SHUNTS MV Vmax:       1.28 m/s     Systemic VTI:  0.16 m MV Vmean:      79.7 cm/s    Systemic Diam: 2.00 cm MV Decel Time: 178 msec MV E velocity: 128.00 cm/s Marcina Millard MD Electronically signed by Marcina Millard MD Signature Date/Time: 01/05/2023/5:15:17 PM    Final    DG Chest Port 1 View  Result Date: 01/05/2023 CLINICAL DATA:  Palpitations EXAM: PORTABLE CHEST 1 VIEW COMPARISON:  09/08/2021 FINDINGS: Single chamber pacer into the right ventricle. Normal heart size. Aortic tortuosity. There is no edema, consolidation, effusion, or pneumothorax. IMPRESSION: No evidence of active disease. Electronically Signed   By: Tiburcio Pea M.D.   On: 01/05/2023 04:22    Microbiology: Results for orders placed or performed during the hospital encounter of 01/05/23  SARS Coronavirus 2 by RT PCR (hospital order, performed in [facility] hospital lab) *cepheid single result test* Anterior Nasal Swab     Status: None   Collection Time: 01/05/23  3:54 AM   Specimen: Anterior Nasal Swab  Result Value Ref Range Status   SARS Coronavirus 2 by RT PCR NEGATIVE NEGATIVE Final    Comment: (NOTE) SARS-CoV-2 target nucleic acids are NOT DETECTED.  The SARS-CoV-2 RNA is generally detectable in upper and lower respiratory specimens during the acute phase of infection. The lowest concentration of SARS-CoV-2 viral copies this assay can detect is 250 copies / mL. A negative result does not preclude SARS-CoV-2 infection and should not be used as the sole basis for treatment or other patient management decisions.  A negative result may occur with improper specimen collection / handling, submission of specimen other than nasopharyngeal swab, presence of viral mutation(s) within the areas targeted by this assay, and inadequate number of viral copies (<250 copies / mL). A negative result must be combined with clinical observations, patient history, and epidemiological information.  Fact Sheet for Patients:   RoadLapTop.co.za  Fact Sheet for Healthcare Providers: http://kim-miller.com/  This test is not yet approved or  cleared by the [place] FDA and has been authorized for detection and/or diagnosis of SARS-CoV-2 by FDA under an Emergency Use Authorization (EUA).  This EUA will remain in  effect (meaning this test can be used) for the duration of the COVID-19 declaration under Section 564(b)(1) of the Act, 21 U.S.C. section 360bbb-3(b)(1), unless the authorization is terminated or revoked sooner.  Performed at [facility], [address] [place], [place] [address]     Labs: CBC: Recent Labs  Lab 01/05/23 0354 01/06/23 0450  WBC 8.7 7.3  NEUTROABS 4.1  --   HGB 14.5 13.8  HCT 44.2 43.6  MCV 92.5 93.6  PLT 165 144*   Basic Metabolic Panel: Recent Labs  Lab 01/05/23 0354 01/06/23 0450  NA 139 139  K 3.7 4.0  CL 109 104  CO2 21* 25  GLUCOSE 212* 159*  BUN 22 27*  CREATININE 1.02 1.08  CALCIUM 8.7* 8.5*   Liver Function Tests: Recent Labs  Lab 01/05/23 0354 01/06/23 0450  AST 28 25  ALT 23 22  ALKPHOS 68 46  BILITOT 1.0 1.2  PROT 6.5 6.2*  ALBUMIN 4.1 3.9   CBG: No results for input(s): "GLUCAP" in the last 168 hours.  Discharge time spent: greater than 30 minutes.  Signed: Lucile Shutters, MD [facility] 01/06/2023

## 2023-01-17 ENCOUNTER — Encounter: Payer: Self-pay | Admitting: Physician Assistant

## 2023-01-17 ENCOUNTER — Ambulatory Visit (INDEPENDENT_AMBULATORY_CARE_PROVIDER_SITE_OTHER): Payer: Medicare Other | Admitting: Physician Assistant

## 2023-01-17 VITALS — BP 126/78 | HR 56 | Temp 97.9°F | Resp 13 | Ht 69.0 in | Wt 226.0 lb

## 2023-01-17 DIAGNOSIS — Z0000 Encounter for general adult medical examination without abnormal findings: Secondary | ICD-10-CM | POA: Diagnosis not present

## 2023-01-17 DIAGNOSIS — I152 Hypertension secondary to endocrine disorders: Secondary | ICD-10-CM

## 2023-01-17 DIAGNOSIS — I5042 Chronic combined systolic (congestive) and diastolic (congestive) heart failure: Secondary | ICD-10-CM

## 2023-01-17 DIAGNOSIS — E118 Type 2 diabetes mellitus with unspecified complications: Secondary | ICD-10-CM

## 2023-01-17 DIAGNOSIS — G4733 Obstructive sleep apnea (adult) (pediatric): Secondary | ICD-10-CM

## 2023-01-17 DIAGNOSIS — I480 Paroxysmal atrial fibrillation: Secondary | ICD-10-CM

## 2023-01-17 DIAGNOSIS — E1169 Type 2 diabetes mellitus with other specified complication: Secondary | ICD-10-CM

## 2023-01-17 NOTE — Assessment & Plan Note (Addendum)
Recent afib with rvr, now on temp. Amiodarone in addition to coreg and has upcoming appts w/ cardio Anticoag with xarelto

## 2023-01-17 NOTE — Assessment & Plan Note (Addendum)
Euvolemic today. Prn toresmide and eplerenone 25 mg bid

## 2023-01-17 NOTE — Assessment & Plan Note (Addendum)
Ordered A1c, last A1c 7.0% Managed with farxiga 10 mg On acei, statin. Foot exam completed today Uacr ordered Ref to optho F.u 4-6 mo pending a1c

## 2023-01-17 NOTE — Progress Notes (Signed)
I,Vanessa  Vital,acting as a Neurosurgeon for [employer], PA-C.,have documented all relevant documentation on the behalf of Alfredia Ferguson, PA-C,as directed by  Alfredia Ferguson, PA-C while in the presence of Alfredia Ferguson, PA-C.    Annual Wellness Visit     Patient: Robert Grant, Male    DOB: [birth year], 67 y.o.   MRN: [id number] Visit Date: 01/17/2023  Today's Provider: Alfredia Ferguson, PA-C   Cc. awv  Subjective    CABE MOSKWA is a 67 y.o. male who presents today for his Annual Wellness Visit. He reports consuming a general diet. The patient has a physically strenuous job, but has no regular exercise apart from work.  He generally feels well. He reports sleeping well. He does not have additional problems to discuss today.   HPI   Medications: Outpatient Medications Prior to Visit  Medication Sig   amiodarone (PACERONE) 200 MG tablet Take 1 tablet (200 mg total) by mouth daily.   atorvastatin (LIPITOR) 40 MG tablet Take 40 mg by mouth daily.   carvedilol (COREG) 25 MG tablet Take 25 mg by mouth 2 (two) times daily.   dapagliflozin propanediol (FARXIGA) 10 MG TABS tablet Take 1 tablet (10 mg total) by mouth daily.   eplerenone (INSPRA) 25 MG tablet Take 25 mg by mouth 2 (two) times daily.    fluticasone (FLONASE) 50 MCG/ACT nasal spray Place 2 sprays into both nostrils daily.   hydrALAZINE (APRESOLINE) 10 MG tablet Take 20 mg by mouth 4 (four) times daily.   lisinopril (ZESTRIL) 10 MG tablet Take 10 mg by mouth daily.   rivaroxaban (XARELTO) 20 MG TABS tablet Take 20 mg by mouth daily with supper.   torsemide (DEMADEX) 20 MG tablet TAKE 1 TABLETS BY MOUTH ONCE DAILY PRN weight gain >230   [DISCONTINUED] amiodarone (PACERONE) 400 MG tablet Take 1 tablet (400 mg total) by mouth 2 (two) times daily for 10 days.   No facility-administered medications prior to visit.    Allergies  Allergen Reactions   Niacin Hives    Other reaction(s): Other (See Comments) Hot flashes    Spironolactone Other (See Comments)    gynecomastia    Patient Care Team: Alfredia Ferguson, PA-C as PCP - General (Physician Assistant)  Review of Systems  HENT:  Positive for hearing loss and tinnitus.   All other systems reviewed and are negative.      Objective    Vitals: BP 126/78 (BP Location: Left Arm, Patient Position: Sitting, Cuff Size: Normal)   Pulse (!) 56   Temp 97.9 F (36.6 C) (Oral)   Resp 13   Ht 5\' 9"  (1.753 m)   Wt 226 lb (102.5 kg)   SpO2 99%   BMI 33.37 kg/m     Physical Exam Constitutional:      General: He is awake.     Appearance: He is well-developed.  HENT:     Head: Normocephalic.  Eyes:     Conjunctiva/sclera: Conjunctivae normal.  Cardiovascular:     Rate and Rhythm: Normal rate and regular rhythm.     Pulses:          Dorsalis pedis pulses are 2+ on the right side and 2+ on the left side.       Posterior tibial pulses are 2+ on the right side and 2+ on the left side.     Heart sounds: Normal heart sounds.  Pulmonary:     Effort: Pulmonary effort is normal.     Breath sounds:  Normal breath sounds.  Feet:     Right foot:     Protective Sensation: 4 sites tested.  4 sites sensed.     Skin integrity: Skin integrity normal.     Toenail Condition: Right toenails are normal.     Left foot:     Protective Sensation: 4 sites tested.  4 sites sensed.     Skin integrity: Skin integrity normal.     Toenail Condition: Left toenails are normal.  Skin:    General: Skin is warm.  Neurological:     Mental Status: He is alert and oriented to person, place, and time.  Psychiatric:        Attention and Perception: Attention normal.        Mood and Affect: Mood normal.        Speech: Speech normal.        Behavior: Behavior is cooperative.    Most recent functional status assessment:    01/17/2023    9:45 AM  In your present state of health, do you have any difficulty performing the following activities:  Hearing? 1  Vision? 0  Difficulty  concentrating or making decisions? 0  Walking or climbing stairs? 0  Dressing or bathing? 0  Doing errands, shopping? 0   Most recent fall risk assessment:    01/17/2023    9:45 AM  Fall Risk   Falls in the past year? 0  Number falls in past yr: 0  Injury with Fall? 0  Risk for fall due to : No Fall Risks  Follow up Falls evaluation completed    Most recent depression screenings:    01/17/2023    9:45 AM 01/14/2022    9:59 AM  PHQ 2/9 Scores  PHQ - 2 Score 0 0  PHQ- 9 Score 0 0   Most recent cognitive screening:     No data to display         Most recent Audit-C alcohol use screening    01/17/2023    9:45 AM  Alcohol Use Disorder Test (AUDIT)  1. How often do you have a drink containing alcohol? 0  2. How many drinks containing alcohol do you have on a typical day when you are drinking? 0  3. How often do you have six or more drinks on one occasion? 0  AUDIT-C Score 0   A score of 3 or more in women, and 4 or more in men indicates increased risk for alcohol abuse, EXCEPT if all of the points are from question 1   No results found for any visits on 01/17/23.  Assessment & Plan     Annual wellness visit done today including the all of the following: Reviewed patient's Family Medical History Reviewed and updated list of patient's medical providers Assessment of cognitive impairment was done Assessed patient's functional ability Established a written schedule for health screening services Health Risk Assessent Completed and Reviewed  Exercise Activities and Dietary recommendations --balanced diet high in fiber and protein, low in sugars, carbs, fats. --physical activity/exercise 30 minutes 3-5 times a week     Immunization History  Administered Date(s) Administered   Fluad Quad(high Dose 65+) 06/18/2022   Influenza, High Dose Seasonal PF 05/31/2021   Influenza,inj,Quad PF,6+ Mos 06/26/2014, 05/27/2015, 06/08/2016, 06/10/2017, 05/23/2018    Influenza-Unspecified 05/30/2014, 05/15/2019, 07/15/2020   Janssen (J&J) SARS-COV-2 Vaccination 03/02/2020   PNEUMOCOCCAL CONJUGATE-20 07/19/2022   Pneumococcal Polysaccharide-23 05/27/2015   Tdap 02/13/2008   Zoster, Live 06/26/2014  Health Maintenance  Topic Date Due   OPHTHALMOLOGY EXAM  Never done   Zoster Vaccines- Shingrix (1 of 2) Never done   DTaP/Tdap/Td (2 - Td or Tdap) 02/12/2018   COVID-19 Vaccine (2 - Janssen risk series) 03/30/2020   Diabetic kidney evaluation - Urine ACR  01/15/2023   HEMOGLOBIN A1C  01/17/2023   INFLUENZA VACCINE  03/31/2023   Diabetic kidney evaluation - eGFR measurement  01/06/2024   FOOT EXAM  01/17/2024   Medicare Annual Wellness (AWV)  01/17/2024   COLONOSCOPY (Pts 45-29yrs Insurance coverage will need to be confirmed)  02/12/2032   Pneumonia Vaccine 48+ Years old  Completed   Hepatitis C Screening  Completed   HPV VACCINES  Aged Out     Discussed health benefits of physical activity, and encouraged him to engage in regular exercise appropriate for his age and condition.    Problem List Items Addressed This Visit       Cardiovascular and Mediastinum   Hypertension associated with diabetes (HCC)    Well controlled cont carvediol 25 bid; hydralazine 20 mg QID and lisinopril 10 mg daily      Paroxysmal atrial fibrillation (HCC)    Recent afib with rvr, now on temp. Amiodarone in addition to coreg and has upcoming appts w/ cardio Anticoag with xarelto      Chronic combined systolic and diastolic heart failure (HCC)    Euvolemic today. Prn toresmide and eplerenone 25 mg bid         Respiratory   Obstructive sleep apnea    Pt is compliant with cpap        Endocrine   Hyperlipidemia associated with type 2 diabetes mellitus (HCC)    Managed on lipitor 40 mg  Pt is anticoag for afib Repeat lipids today fasting; LDL goal < 70 pt was at goal last year      Relevant Orders   Lipid Profile   Controlled type 2 diabetes mellitus  with complication, without long-term current use of insulin (HCC)    Ordered A1c, last A1c 7.0% Managed with farxiga 10 mg On acei, statin. Foot exam completed today Uacr ordered Ref to optho F.u 4-6 mo pending a1c      Relevant Orders   Comprehensive Metabolic Panel (CMET)   HgB A1c   Urine Microalbumin w/creat. ratio   Ambulatory referral to Ophthalmology   Other Visit Diagnoses     Encounter for annual wellness visit (AWV) in Medicare patient    -  Primary        Return in about 6 months (around 07/20/2023) for 4-6 mo pending a1c, chronic conditions.     I, Alfredia Ferguson, PA-C have reviewed all documentation for this visit. The documentation on  01/17/23   for the exam, diagnosis, procedures, and orders are all accurate and complete.  Alfredia Ferguson, PA-C [facility] [address] [place], [place], [address] Office: [phone] Fax: [phone]   [facility]

## 2023-01-17 NOTE — Assessment & Plan Note (Signed)
Pt is compliant with cpap

## 2023-01-17 NOTE — Assessment & Plan Note (Signed)
Managed on lipitor 40 mg  Pt is anticoag for afib Repeat lipids today fasting; LDL goal < 70 pt was at goal last year

## 2023-01-17 NOTE — Assessment & Plan Note (Signed)
Well controlled cont carvediol 25 bid; hydralazine 20 mg QID and lisinopril 10 mg daily

## 2023-01-18 LAB — COMPREHENSIVE METABOLIC PANEL WITH GFR
ALT: 26 [IU]/L (ref 0–44)
AST: 24 [IU]/L (ref 0–40)
Albumin/Globulin Ratio: 2 (ref 1.2–2.2)
Albumin: 4.5 g/dL (ref 3.9–4.9)
Alkaline Phosphatase: 80 [IU]/L (ref 44–121)
BUN/Creatinine Ratio: 20 (ref 10–24)
BUN: 25 mg/dL (ref 8–27)
Bilirubin Total: 0.8 mg/dL (ref 0.0–1.2)
CO2: 22 mmol/L (ref 20–29)
Calcium: 9.4 mg/dL (ref 8.6–10.2)
Chloride: 101 mmol/L (ref 96–106)
Creatinine, Ser: 1.26 mg/dL (ref 0.76–1.27)
Globulin, Total: 2.3 g/dL (ref 1.5–4.5)
Glucose: 171 mg/dL — ABNORMAL HIGH (ref 70–99)
Potassium: 4.4 mmol/L (ref 3.5–5.2)
Sodium: 141 mmol/L (ref 134–144)
Total Protein: 6.8 g/dL (ref 6.0–8.5)
eGFR: 62 mL/min/{1.73_m2}

## 2023-01-18 LAB — LIPID PANEL
Chol/HDL Ratio: 5.5 ratio — ABNORMAL HIGH (ref 0.0–5.0)
Cholesterol, Total: 154 mg/dL (ref 100–199)
HDL: 28 mg/dL — ABNORMAL LOW
LDL Chol Calc (NIH): 64 mg/dL (ref 0–99)
Triglycerides: 398 mg/dL — ABNORMAL HIGH (ref 0–149)
VLDL Cholesterol Cal: 62 mg/dL — ABNORMAL HIGH (ref 5–40)

## 2023-01-18 LAB — HEMOGLOBIN A1C
Est. average glucose Bld gHb Est-mCnc: 169 mg/dL
Hgb A1c MFr Bld: 7.5 % — ABNORMAL HIGH (ref 4.8–5.6)

## 2023-01-18 LAB — MICROALBUMIN / CREATININE URINE RATIO
Creatinine, Urine: 78.9 mg/dL
Microalb/Creat Ratio: 81 mg/g creat — ABNORMAL HIGH (ref 0–29)
Microalbumin, Urine: 63.8 ug/mL

## 2023-02-07 LAB — HM DIABETES EYE EXAM

## 2023-04-21 ENCOUNTER — Ambulatory Visit (INDEPENDENT_AMBULATORY_CARE_PROVIDER_SITE_OTHER): Payer: Medicare Other | Admitting: Family Medicine

## 2023-04-21 ENCOUNTER — Ambulatory Visit: Payer: Self-pay

## 2023-04-21 ENCOUNTER — Encounter: Payer: Self-pay | Admitting: Family Medicine

## 2023-04-21 VITALS — BP 115/72 | HR 54 | Temp 98.1°F | Resp 12 | Ht 69.0 in | Wt 220.9 lb

## 2023-04-21 DIAGNOSIS — R197 Diarrhea, unspecified: Secondary | ICD-10-CM | POA: Diagnosis not present

## 2023-04-21 DIAGNOSIS — Z9049 Acquired absence of other specified parts of digestive tract: Secondary | ICD-10-CM

## 2023-04-21 DIAGNOSIS — Z860100 Personal history of colon polyps, unspecified: Secondary | ICD-10-CM

## 2023-04-21 NOTE — Patient Instructions (Signed)
Dr Tobi Bastos - [facility] - [phone]

## 2023-04-21 NOTE — Progress Notes (Signed)
Acute Office Visit  Subjective:     Patient ID: Robert Grant, male    DOB: April 12, 1956, 67 y.o.   MRN: [id number]  Chief Complaint  Patient presents with   Rectal Bleeding    Reports bad intestinal virus last week. He reports persistent diarrhea. He reports bleeding is better. He does have heart ablation procedure in September. And wants to be sure is better for that procedure.     Rectal Bleeding    Discussed the use of AI scribe software for clinical note transcription with the patient, who gave verbal consent to proceed.  History of Present Illness   The patient, with a history of partial colon removal due to a non-cancerous growth and polyp removal during a colonoscopy, presents with a recent gastrointestinal virus. The virus started about a week and a half ago, causing significant lower abdominal pain and diarrhea. The patient did not experience vomiting. The patient suspects the virus may have been contracted from a meal at a [employer]. The diarrhea led to the development of hemorrhoids, which caused bleeding. The patient has been managing the hemorrhoids with sitz baths and petroleum jelly, which has helped reduce the bleeding. The patient's bowel movements are not normal due to the partial colon removal, and the patient has difficulty distinguishing between normal bowel movements and diarrhea. The patient's appetite has been reduced due to the illness, leading to a weight loss of about ten pounds. The patient's abdominal pain has resolved, but some diarrhea and bleeding when wiping persists. The patient is due for a heart ablation procedure and is currently on amiodarone, which causes nausea. The patient is eager to recover fully from the gastrointestinal virus to proceed with the heart procedure.       Review of Systems  Gastrointestinal:  Positive for hematochezia.        Objective:    BP 115/72 (BP Location: Right Arm, Patient Position: Sitting, Cuff Size: Large)    Pulse (!) 54   Temp 98.1 F (36.7 C) (Temporal)   Resp 12   Ht 5\' 9"  (1.753 m)   Wt 220 lb 14.4 oz (100.2 kg)   SpO2 99%   BMI 32.62 kg/m    Physical Exam Vitals reviewed.  Constitutional:      General: He is not in acute distress.    Appearance: Normal appearance. He is not diaphoretic.  HENT:     Head: Normocephalic and atraumatic.  Eyes:     General: No scleral icterus.    Conjunctiva/sclera: Conjunctivae normal.  Cardiovascular:     Rate and Rhythm: Normal rate and regular rhythm.     Heart sounds: Normal heart sounds.  Pulmonary:     Effort: Pulmonary effort is normal. No respiratory distress.     Breath sounds: Normal breath sounds. No wheezing or rhonchi.  Abdominal:     General: Bowel sounds are normal. There is no distension.     Palpations: Abdomen is soft.     Tenderness: There is no abdominal tenderness. There is no guarding or rebound.  Musculoskeletal:     Cervical back: Neck supple.     Right lower leg: No edema.     Left lower leg: No edema.  Lymphadenopathy:     Cervical: No cervical adenopathy.  Skin:    General: Skin is warm and dry.     Findings: No rash.  Neurological:     Mental Status: He is alert.  Psychiatric:  Mood and Affect: Mood normal.        Behavior: Behavior normal.     No results found for any visits on 04/21/23.      Assessment & Plan:   Problem List Items Addressed This Visit       Other   History of partial colectomy   History of colon polyps   Other Visit Diagnoses     Diarrhea of presumed infectious origin    -  Primary   Relevant Orders   CBC with Differential/Platelet   Comprehensive metabolic panel   GI Profile, Stool, PCR           Gastroenteritis Recent history of severe diarrhea and abdominal pain, likely secondary to a viral or foodborne illness. Symptoms have improved but not completely resolved. No vomiting, but presence of hemorrhoids with rectal bleeding. -Order stool test to rule out  bacterial infection. -Order blood work to check blood counts, kidney and liver function, and electrolytes. -Advise patient to take probiotics (either in yogurt or pill form) to replenish gut flora. -Advise patient to maintain hydration and gradually reintroduce normal diet.  History of colon polyps Patient had a colonoscopy a year ago with removal of a precancerous polyp. Follow-up colonoscopy was recommended in six months but has not been done. -Contact GI specialist to schedule follow-up colonoscopy.  Atrial fibrillation Patient is on amiodarone, which is causing nausea. Patient has a heart ablation procedure scheduled in September. -Ensure patient is well for the procedure. -Check A1C and other chronic conditions in November.  Hemorrhoids Patient has been managing with sitz baths and petroleum jelly, with improvement in bleeding. -Continue current management.        No orders of the defined types were placed in this encounter.   Return in about 3 months (around 07/22/2023) for chronic disease f/u.  Shirlee Latch, MD

## 2023-04-21 NOTE — Telephone Encounter (Signed)
  Chief Complaint: Rectal bleeding Symptoms: above Frequency: 1 week Pertinent Negatives: Patient denies  Disposition: [] ED /[] Urgent Care (no appt availability in office) / [x] Appointment(In office/virtual)/ []  [facility]/ [] Home Care/ [] Refused Recommended Disposition /[] [facility]/ []  Follow-up with PCP Additional Notes: PT had diarrhea about a week and 1/2 ago. Pt states that d/t all the diarrhea his hemorrhoid  got worse. Pt has blood in tissue and has had bleeding through his underwear. Pt states that bleeding is better now, but wants it checked out so he can have procedure in 2 weeks.  Reason for Disposition  MODERATE rectal bleeding (small blood clots, passing blood without stool, or toilet water turns red)  Answer Assessment - Initial Assessment Questions 1. APPEARANCE of BLOOD: "What color is it?" "Is it passed separately, on the surface of the stool, or mixed in with the stool?"      Bright red 2. AMOUNT: "How much blood was passed?"      Less than a cup 3. FREQUENCY: "How many times has blood been passed with the stools?"      Each time 4. ONSET: "When was the blood first seen in the stools?" (Days or weeks)      1 week 5. DIARRHEA: "Is there also some diarrhea?" If Yes, ask: "How many diarrhea stools in the past 24 hours?"      Yes -  6. CONSTIPATION: "Do you have constipation?" If Yes, ask: "How bad is it?"     no 8. BLOOD THINNERS: "Do you take any blood thinners?" (e.g., Coumadin/warfarin, Pradaxa/dabigatran, aspirin)     Xarelto 9. OTHER SYMPTOMS: "Do you have any other symptoms?"  (e.g., abdomen pain, vomiting, dizziness, fever)     Diarrhea  Protocols used: Rectal Bleeding-A-AH

## 2023-04-22 LAB — CBC WITH DIFFERENTIAL/PLATELET
Basophils Absolute: 0.1 10*3/uL (ref 0.0–0.2)
Basos: 1 %
EOS (ABSOLUTE): 0.3 10*3/uL (ref 0.0–0.4)
Eos: 2 %
Hematocrit: 42.1 % (ref 37.5–51.0)
Hemoglobin: 13.6 g/dL (ref 13.0–17.7)
Immature Grans (Abs): 0.1 10*3/uL (ref 0.0–0.1)
Immature Granulocytes: 1 %
Lymphocytes Absolute: 2.4 10*3/uL (ref 0.7–3.1)
Lymphs: 20 %
MCH: 28.9 pg (ref 26.6–33.0)
MCHC: 32.3 g/dL (ref 31.5–35.7)
MCV: 89 fL (ref 79–97)
Monocytes Absolute: 1.2 10*3/uL — ABNORMAL HIGH (ref 0.1–0.9)
Monocytes: 10 %
Neutrophils Absolute: 7.8 10*3/uL — ABNORMAL HIGH (ref 1.4–7.0)
Neutrophils: 66 %
Platelets: 229 10*3/uL (ref 150–450)
RBC: 4.71 x10E6/uL (ref 4.14–5.80)
RDW: 14.6 % (ref 11.6–15.4)
WBC: 11.8 10*3/uL — ABNORMAL HIGH (ref 3.4–10.8)

## 2023-04-22 LAB — COMPREHENSIVE METABOLIC PANEL
ALT: 31 IU/L (ref 0–44)
AST: 30 IU/L (ref 0–40)
Albumin: 4.4 g/dL (ref 3.9–4.9)
Alkaline Phosphatase: 81 IU/L (ref 44–121)
BUN/Creatinine Ratio: 27 — ABNORMAL HIGH (ref 10–24)
BUN: 39 mg/dL — ABNORMAL HIGH (ref 8–27)
Bilirubin Total: 0.6 mg/dL (ref 0.0–1.2)
CO2: 17 mmol/L — ABNORMAL LOW (ref 20–29)
Calcium: 9.1 mg/dL (ref 8.6–10.2)
Chloride: 106 mmol/L (ref 96–106)
Creatinine, Ser: 1.44 mg/dL — ABNORMAL HIGH (ref 0.76–1.27)
Globulin, Total: 2.3 g/dL (ref 1.5–4.5)
Glucose: 112 mg/dL — ABNORMAL HIGH (ref 70–99)
Potassium: 5.2 mmol/L (ref 3.5–5.2)
Sodium: 140 mmol/L (ref 134–144)
Total Protein: 6.7 g/dL (ref 6.0–8.5)
eGFR: 53 mL/min/{1.73_m2} — ABNORMAL LOW (ref >59)

## 2023-04-26 LAB — GI PROFILE, STOOL, PCR
Adenovirus F 40/41: NOT DETECTED
Astrovirus: NOT DETECTED
C difficile toxin A/B: NOT DETECTED
Campylobacter: NOT DETECTED
Cryptosporidium: NOT DETECTED
Cyclospora cayetanensis: NOT DETECTED
Entamoeba histolytica: NOT DETECTED
Enteroaggregative E coli: NOT DETECTED
Enteropathogenic E coli: NOT DETECTED
Enterotoxigenic E coli: NOT DETECTED
Giardia lamblia: NOT DETECTED
Norovirus GI/GII: NOT DETECTED
Plesiomonas shigelloides: NOT DETECTED
Rotavirus A: NOT DETECTED
Salmonella: DETECTED — AB
Sapovirus: NOT DETECTED
Shiga-toxin-producing E coli: NOT DETECTED
Shigella/Enteroinvasive E coli: NOT DETECTED
Vibrio cholerae: NOT DETECTED
Vibrio: NOT DETECTED
Yersinia enterocolitica: NOT DETECTED

## 2023-04-27 ENCOUNTER — Telehealth: Payer: Self-pay

## 2023-04-27 ENCOUNTER — Other Ambulatory Visit: Payer: Self-pay | Admitting: Family Medicine

## 2023-04-27 MED ORDER — CIPROFLOXACIN HCL 500 MG PO TABS: 500.0000 mg | ORAL_TABLET | Freq: Two times a day (BID) | ORAL | 0 refills | Status: DC

## 2023-04-27 NOTE — Telephone Encounter (Signed)
Copied from CRM [id number]. Topic: General - Other >> Apr 27, 2023  2:50 PM Dondra Prader E wrote: Reason for CRM: Pt has questions for clinic regarding recent discussion about medications following his lab work please advise

## 2023-06-15 IMAGING — DX DG CHEST 1V
1 series · 1 of 1 positions shown · non-contrast
Comparison: Chest x-ray 04/12/2015.

CLINICAL DATA: 65-year-old male under preprocedural study prior to
MRI examination.

EXAM:
CHEST  1 VIEW

[chest pa]
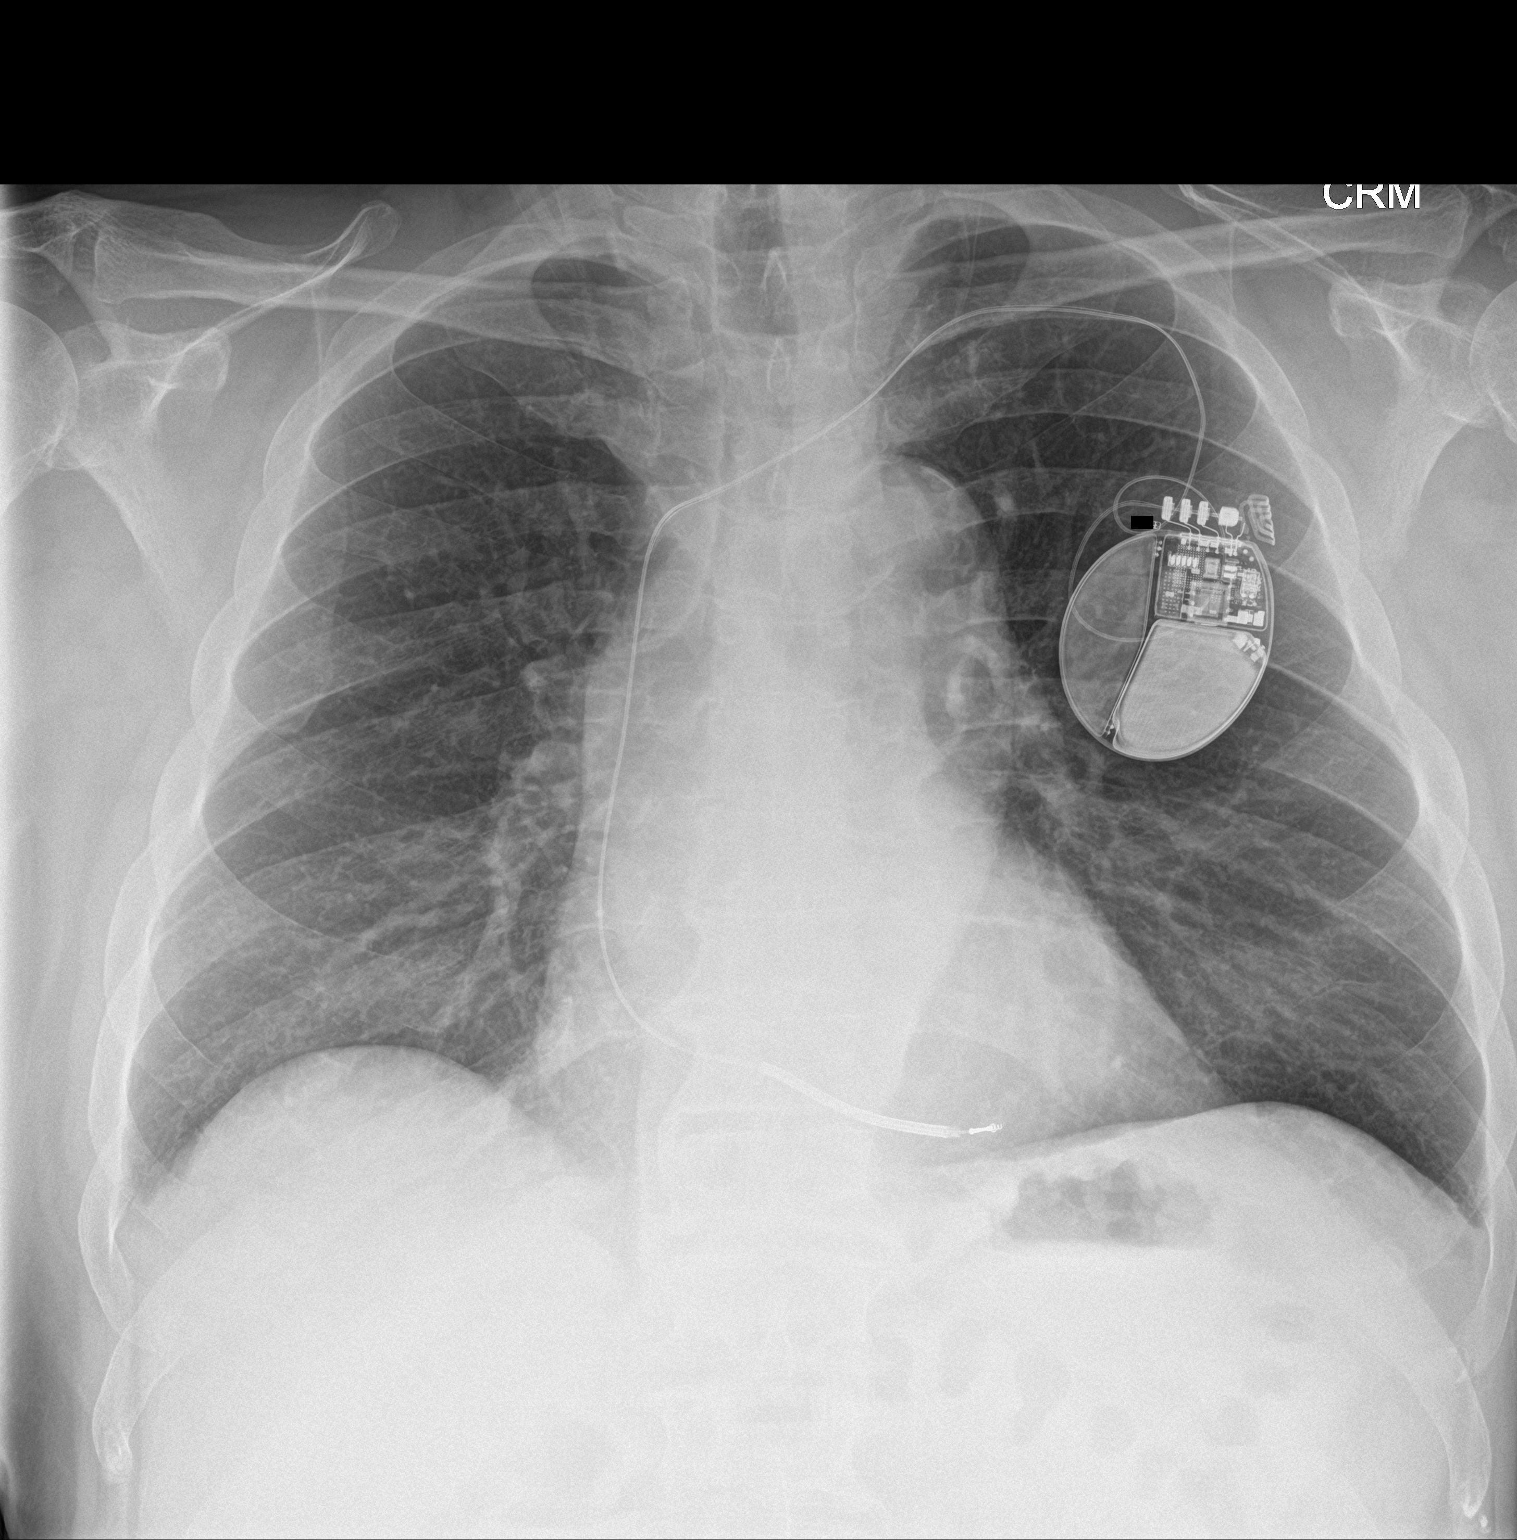

[1 of 1 positions shown; findings below may reference images not displayed]

FINDINGS: Left-sided pacemaker/AICD with lead tip projecting over the expected
location of the right ventricular apex. Lung volumes are normal. No
consolidative airspace disease. No pleural effusions. No
pneumothorax. No pulmonary nodule or mass noted. Pulmonary
vasculature and the cardiomediastinal silhouette are within normal
limits. Atherosclerosis in the thoracic aorta.
IMPRESSION: 1.  No radiographic evidence of acute cardiopulmonary disease.
2. Aortic atherosclerosis.
3. Pacemaker/AICD in position, as above.

## 2023-07-22 ENCOUNTER — Ambulatory Visit: Payer: Medicare Other | Admitting: Family Medicine

## 2023-08-11 ENCOUNTER — Ambulatory Visit: Payer: Medicare Other | Admitting: Family Medicine

## 2023-11-24 ENCOUNTER — Encounter: Payer: Self-pay | Admitting: Family Medicine

## 2023-11-24 ENCOUNTER — Ambulatory Visit: Admitting: Family Medicine

## 2023-11-24 VITALS — BP 152/92 | HR 83 | Temp 98.3°F | Resp 16 | Ht 69.0 in | Wt 224.0 lb

## 2023-11-24 DIAGNOSIS — J101 Influenza due to other identified influenza virus with other respiratory manifestations: Secondary | ICD-10-CM

## 2023-11-24 DIAGNOSIS — E118 Type 2 diabetes mellitus with unspecified complications: Secondary | ICD-10-CM

## 2023-11-24 DIAGNOSIS — I10 Essential (primary) hypertension: Secondary | ICD-10-CM | POA: Diagnosis not present

## 2023-11-24 DIAGNOSIS — J069 Acute upper respiratory infection, unspecified: Secondary | ICD-10-CM

## 2023-11-24 LAB — POC COVID19/FLU A&B COMBO
Covid Antigen, POC: NEGATIVE
Influenza A Antigen, POC: POSITIVE — AB
Influenza B Antigen, POC: NEGATIVE

## 2023-11-24 MED ORDER — GUAIFENESIN-CODEINE 100-10 MG/5ML PO SOLN
10.0000 mL | Freq: Three times a day (TID) | ORAL | 0 refills | Status: AC | PRN
Start: 2023-11-24 — End: ?

## 2023-11-24 NOTE — Progress Notes (Signed)
 Acute visit   Patient: Robert Grant   DOB: [birth year]   68 y.o. Male  MRN: [id number] PCP: Erasmo Downer, MD   Chief Complaint  Patient presents with   URI    Symptoms: Sinus pressure, runny nose,cough, post nasal drip Negative symptoms: Fever. Frequency:x 34 days ago   Subjective    Discussed the use of AI scribe software for clinical note transcription with the patient, who gave verbal consent to proceed.  History of Present Illness   The patient, with a history of high blood pressure, diabetes, and heart surgeries, presents with symptoms consistent with flu A. The patient started experiencing symptoms about four days prior to the visit, including sinus congestion, coughing, and drainage. The patient has not noticed any fever and has been managing symptoms with over-the-counter medication. However, the patient has a history of high blood pressure and the over-the-counter medication may be contributing to elevated blood pressure levels. The patient has been maintaining his blood pressure in the 130s and 120s prior to the onset of the flu symptoms. The patient also reports having received a flu shot this year.        Review of Systems  Objective    BP (!) 152/92   Pulse 83   Temp 98.3 F (36.8 C) (Oral)   Resp 16   Ht 5\' 9"  (1.753 m)   Wt 224 lb (101.6 kg)   SpO2 97%   BMI 33.08 kg/m  Physical Exam Vitals reviewed.  Constitutional:      General: He is not in acute distress.    Appearance: Normal appearance. He is not diaphoretic.  HENT:     Head: Normocephalic and atraumatic.     Right Ear: Tympanic membrane, ear canal and external ear normal.     Left Ear: Tympanic membrane, ear canal and external ear normal.     Nose: Congestion present.     Mouth/Throat:     Mouth: Mucous membranes are moist.     Pharynx: Oropharynx is clear. No oropharyngeal exudate.  Eyes:     General: No scleral icterus.    Conjunctiva/sclera: Conjunctivae normal.   Cardiovascular:     Rate and Rhythm: Normal rate and regular rhythm.     Heart sounds: Murmur heard.  Pulmonary:     Effort: Pulmonary effort is normal. No respiratory distress.     Breath sounds: Normal breath sounds. No wheezing or rhonchi.  Musculoskeletal:     Cervical back: Neck supple.     Right lower leg: No edema.     Left lower leg: No edema.  Lymphadenopathy:     Cervical: No cervical adenopathy.  Skin:    General: Skin is warm and dry.  Neurological:     Mental Status: He is alert. Mental status is at baseline.       Results for orders placed or performed in visit on 11/24/23  POC Covid19/Flu A&B Antigen  Result Value Ref Range   Influenza A Antigen, POC Positive (A) Negative   Influenza B Antigen, POC Negative Negative   Covid Antigen, POC Negative Negative    Assessment & Plan     Problem List Items Addressed This Visit   None Visit Diagnoses       Viral upper respiratory tract infection    -  Primary   Relevant Orders   POC Covid19/Flu A&B Antigen (Completed)     Influenza A  Influenza A Tested positive for Influenza A, with symptoms including sinus congestion, sinus pain, drainage, and cough for four days. No fever or severe systemic symptoms. Received a flu shot this year. Antiviral medication (Tamiflu) is not indicated as it is most effective within the first two days of symptom onset. Condition expected to resolve within 7-10 days, with possible lingering cough. Antibiotics are not indicated as the flu is viral. - Advise symptomatic treatment with over-the-counter medications for cough and congestion. - Prescribe cough medicine containing codeine and guaifenesin to be taken up to three times a day as needed, avoiding use when drowsiness would be unsafe. - Continue using Flonase for congestion relief. - Send information about Influenza A to MyChart.  Hypertension Blood pressure was elevated during the visit, likely due to illness and  medication use. Blood pressure is usually well-controlled, with readings typically in the 130s and 120s. - Monitor blood pressure and reassess when well. - Schedule follow-up appointment to evaluate blood pressure control.  Diabetes Mellitus Blood glucose levels have been well-managed, with recent readings under 150 mg/dL. It has been several months since the last diabetes follow-up. - Schedule a follow-up appointment to assess diabetes management.       Meds ordered this encounter  Medications   guaiFENesin-codeine 100-10 MG/5ML syrup    Sig: Take 10 mLs by mouth 3 (three) times daily as needed for cough.    Dispense:  120 mL    Refill:  0     Return in about 2 weeks (around 12/08/2023) for chronic disease f/u.      Shirlee Latch, MD  [facility] [phone] (phone) [phone] (fax)  [facility]

## 2023-12-26 ENCOUNTER — Encounter: Payer: Self-pay | Admitting: Family Medicine

## 2023-12-26 ENCOUNTER — Ambulatory Visit (INDEPENDENT_AMBULATORY_CARE_PROVIDER_SITE_OTHER): Admitting: Family Medicine

## 2023-12-26 VITALS — BP 130/80 | HR 75 | Ht 69.0 in | Wt 217.8 lb

## 2023-12-26 DIAGNOSIS — E785 Hyperlipidemia, unspecified: Secondary | ICD-10-CM

## 2023-12-26 DIAGNOSIS — E118 Type 2 diabetes mellitus with unspecified complications: Secondary | ICD-10-CM

## 2023-12-26 DIAGNOSIS — E1159 Type 2 diabetes mellitus with other circulatory complications: Secondary | ICD-10-CM

## 2023-12-26 DIAGNOSIS — E1169 Type 2 diabetes mellitus with other specified complication: Secondary | ICD-10-CM | POA: Diagnosis not present

## 2023-12-26 DIAGNOSIS — I5042 Chronic combined systolic (congestive) and diastolic (congestive) heart failure: Secondary | ICD-10-CM | POA: Diagnosis not present

## 2023-12-26 DIAGNOSIS — I152 Hypertension secondary to endocrine disorders: Secondary | ICD-10-CM

## 2023-12-26 MED ORDER — FLUTICASONE PROPIONATE 50 MCG/ACT NA SUSP: 2.0000 | Freq: Every day | NASAL | 3 refills | Status: AC

## 2023-12-26 NOTE — Assessment & Plan Note (Signed)
 Cholesterol levels recently checked by cardiology, no further testing needed at this time.

## 2023-12-26 NOTE — Assessment & Plan Note (Signed)
 Blood pressure is slightly elevated at 143 mmHg in the office, but well-controlled at home with readings in the 130s. Possible white coat syndrome.

## 2023-12-26 NOTE — Assessment & Plan Note (Signed)
 Diabetes management requires follow-up. A1c not recently checked, and annual urine test for microalbuminuria is due. Foot exam performed today. - Order A1c and urine test for microalbuminuria.

## 2023-12-26 NOTE — Progress Notes (Signed)
 Established patient visit   Patient: Robert Grant   DOB: 06-01-1956   68 y.o. Male  MRN: [id number] Visit Date: 12/26/2023  Today's healthcare provider: Aden Agreste, MD   Chief Complaint  Patient presents with   Medical Management of Chronic Issues    2 week follow-up   Hypertension    Monitors at home and good reading at home   Diabetes    Home readings 110-140 mg/dL No symptoms to report    Subjective    Hypertension  Diabetes   HPI     Medical Management of Chronic Issues    Additional comments: 2 week follow-up        Hypertension    Additional comments: Monitors at home and good reading at home        Diabetes    Additional comments: Home readings 110-140 mg/dL No symptoms to report       Last edited by Pasty Bongo, CMA on 12/26/2023  3:03 PM.       Discussed the use of AI scribe software for clinical note transcription with the patient, who gave verbal consent to proceed.  History of Present Illness   The patient, a 68 year old with a history of AFib cardiomyopathy, NICM, combined systolic and diastolic CHF, hypertension, type two diabetes, and hyperlipidemia, presents for a chronic follow-up. The patient's current medications include carvedilol , Farxiga , hydralazine , lisinopril, Xarelto , and Torsemide . The patient reports sinus issues due to pollen and is almost out of Flonase nasal spray. The patient also mentions that his potassium was low at his last cardiology visit and is due for a recheck. The patient's blood pressure is slightly high at the office, but he reports it is usually in the 130s at home. The patient also mentions the financial burden of his medications, particularly Xarelto  and Farxiga .         Medications: Outpatient Medications Prior to Visit  Medication Sig   ascorbic acid (VITAMIN C) 500 MG tablet Take by mouth.   carvedilol  (COREG ) 25 MG tablet Take 25 mg by mouth 2 (two) times daily.   dapagliflozin   propanediol (FARXIGA ) 10 MG TABS tablet Take 1 tablet (10 mg total) by mouth daily.   eplerenone (INSPRA) 25 MG tablet Take 25 mg by mouth 2 (two) times daily.    guaiFENesin -codeine  100-10 MG/5ML syrup Take 10 mLs by mouth 3 (three) times daily as needed for cough.   lisinopril (ZESTRIL) 10 MG tablet Take 10 mg by mouth daily.   rivaroxaban  (XARELTO ) 20 MG TABS tablet Take 20 mg by mouth daily with supper.   torsemide  (DEMADEX ) 20 MG tablet TAKE 1 TABLETS BY MOUTH ONCE DAILY PRN weight gain >230   [DISCONTINUED] fluticasone (FLONASE) 50 MCG/ACT nasal spray Place 2 sprays into both nostrils daily.   hydrALAZINE  (APRESOLINE ) 10 MG tablet Take 20 mg by mouth 4 (four) times daily.   No facility-administered medications prior to visit.    Review of Systems     Objective    BP 130/80 Comment: home reading  Pulse 75   Ht 5\' 9"  (1.753 m)   Wt 217 lb 12.8 oz (98.8 kg)   BMI 32.16 kg/m    Physical Exam Vitals reviewed.  Constitutional:      General: He is not in acute distress.    Appearance: Normal appearance. He is not diaphoretic.  HENT:     Head: Normocephalic and atraumatic.  Eyes:     General: No scleral icterus.  Conjunctiva/sclera: Conjunctivae normal.  Cardiovascular:     Rate and Rhythm: Normal rate and regular rhythm.     Heart sounds: Normal heart sounds. No murmur heard. Pulmonary:     Effort: Pulmonary effort is normal. No respiratory distress.     Breath sounds: Normal breath sounds. No wheezing or rhonchi.  Musculoskeletal:     Cervical back: Neck supple.     Right lower leg: No edema.     Left lower leg: No edema.  Lymphadenopathy:     Cervical: No cervical adenopathy.  Skin:    General: Skin is warm and dry.     Findings: No rash.  Neurological:     Mental Status: He is alert and oriented to person, place, and time. Mental status is at baseline.  Psychiatric:        Mood and Affect: Mood normal.        Behavior: Behavior normal.      Diabetic Foot  Exam - Simple   Simple Foot Form Diabetic Foot exam was performed with the following findings: Yes 12/26/2023  3:25 PM  Visual Inspection No deformities, no ulcerations, no other skin breakdown bilaterally: Yes Sensation Testing Intact to touch and monofilament testing bilaterally: Yes Pulse Check Posterior Tibialis and Dorsalis pulse intact bilaterally: Yes Comments      No results found for any visits on 12/26/23.  Assessment & Plan     Problem List Items Addressed This Visit       Cardiovascular and Mediastinum   Hypertension associated with diabetes (HCC)   Blood pressure is slightly elevated at 143 mmHg in the office, but well-controlled at home with readings in the 130s. Possible white coat syndrome.      Relevant Orders   Basic Metabolic Panel (BMET)   Chronic combined systolic and diastolic heart failure (HCC)   Chronic management of heart failure with current medications. Potassium levels were borderline low, requiring monitoring. Recent medication adjustment by cardiology to address low potassium. - Order potassium level test.        Endocrine   Hyperlipidemia associated with type 2 diabetes mellitus (HCC)   Cholesterol levels recently checked by cardiology, no further testing needed at this time.      Controlled type 2 diabetes mellitus with complication, without long-term current use of insulin (HCC) - Primary   Diabetes management requires follow-up. A1c not recently checked, and annual urine test for microalbuminuria is due. Foot exam performed today. - Order A1c and urine test for microalbuminuria.      Relevant Orders   Hemoglobin A1c   Microalbumin / creatinine urine ratio       Allergic rhinitis due to pollen Experiencing sinus issues due to pollen. Current Flonase prescription is nearly finished. Insurance covers well, prefers 70-month supply. - Prescribe Flonase, 70-month supply.       Return in about 6 months (around 06/26/2024) for AWV, With  PCP.       Aden Agreste, MD  [facility] [phone] (phone) [phone] (fax)  [facility]

## 2023-12-26 NOTE — Assessment & Plan Note (Signed)
 Chronic management of heart failure with current medications. Potassium levels were borderline low, requiring monitoring. Recent medication adjustment by cardiology to address low potassium. - Order potassium level test.

## 2023-12-27 LAB — HEMOGLOBIN A1C
Est. average glucose Bld gHb Est-mCnc: 126 mg/dL
Hgb A1c MFr Bld: 6 % — ABNORMAL HIGH (ref 4.8–5.6)

## 2023-12-27 LAB — BASIC METABOLIC PANEL WITH GFR
BUN/Creatinine Ratio: 12 (ref 10–24)
BUN: 13 mg/dL (ref 8–27)
CO2: 23 mmol/L (ref 20–29)
Calcium: 9.7 mg/dL (ref 8.6–10.2)
Chloride: 103 mmol/L (ref 96–106)
Creatinine, Ser: 1.06 mg/dL (ref 0.76–1.27)
Glucose: 107 mg/dL — ABNORMAL HIGH (ref 70–99)
Potassium: 4.1 mmol/L (ref 3.5–5.2)
Sodium: 145 mmol/L — ABNORMAL HIGH (ref 134–144)
eGFR: 76 mL/min/{1.73_m2} (ref >59)

## 2023-12-27 LAB — MICROALBUMIN / CREATININE URINE RATIO
Creatinine, Urine: 122.6 mg/dL
Microalb/Creat Ratio: 15 mg/g{creat} (ref 0–29)
Microalbumin, Urine: 17.8 ug/mL

## 2023-12-28 ENCOUNTER — Encounter: Payer: Self-pay | Admitting: Family Medicine

## 2024-02-08 LAB — HM DIABETES EYE EXAM

## 2024-06-28 ENCOUNTER — Ambulatory Visit (INDEPENDENT_AMBULATORY_CARE_PROVIDER_SITE_OTHER): Admitting: Family Medicine

## 2024-06-28 ENCOUNTER — Encounter: Payer: Self-pay | Admitting: Family Medicine

## 2024-06-28 VITALS — BP 169/93 | HR 79 | Ht 69.0 in | Wt 218.6 lb

## 2024-06-28 DIAGNOSIS — Z79899 Other long term (current) drug therapy: Secondary | ICD-10-CM

## 2024-06-28 DIAGNOSIS — E118 Type 2 diabetes mellitus with unspecified complications: Secondary | ICD-10-CM

## 2024-06-28 DIAGNOSIS — Z7901 Long term (current) use of anticoagulants: Secondary | ICD-10-CM

## 2024-06-28 DIAGNOSIS — Z95811 Presence of heart assist device: Secondary | ICD-10-CM

## 2024-06-28 DIAGNOSIS — E1169 Type 2 diabetes mellitus with other specified complication: Secondary | ICD-10-CM

## 2024-06-28 DIAGNOSIS — I480 Paroxysmal atrial fibrillation: Secondary | ICD-10-CM | POA: Diagnosis not present

## 2024-06-28 DIAGNOSIS — Z125 Encounter for screening for malignant neoplasm of prostate: Secondary | ICD-10-CM

## 2024-06-28 DIAGNOSIS — E785 Hyperlipidemia, unspecified: Secondary | ICD-10-CM

## 2024-06-28 DIAGNOSIS — Z7984 Long term (current) use of oral hypoglycemic drugs: Secondary | ICD-10-CM

## 2024-06-28 DIAGNOSIS — Z860100 Personal history of colon polyps, unspecified: Secondary | ICD-10-CM

## 2024-06-28 DIAGNOSIS — Z0001 Encounter for general adult medical examination with abnormal findings: Secondary | ICD-10-CM

## 2024-06-28 DIAGNOSIS — E1159 Type 2 diabetes mellitus with other circulatory complications: Secondary | ICD-10-CM | POA: Diagnosis not present

## 2024-06-28 DIAGNOSIS — I152 Hypertension secondary to endocrine disorders: Secondary | ICD-10-CM

## 2024-06-28 DIAGNOSIS — Z0000 Encounter for general adult medical examination without abnormal findings: Secondary | ICD-10-CM

## 2024-06-28 NOTE — Patient Instructions (Signed)
 Consider asking at the pharmacy about the tetanus shot (TDAP) and the shingles shot (Shingrix)  ?

## 2024-06-28 NOTE — Assessment & Plan Note (Signed)
 Diabetes management ongoing with Farxiga . Previous A1c in a good range. - Check A1c as part of routine blood work

## 2024-06-28 NOTE — Assessment & Plan Note (Signed)
 AICD in place. ?

## 2024-06-28 NOTE — Progress Notes (Signed)
 Annual Wellness Visit     Patient: Robert Grant, Male    DOB: May 03, 1956, 68 y.o.   MRN: [id number] Visit Date: 06/28/2024  Today's Provider: Jon Eva, MD   Chief Complaint  Patient presents with   Annual Exam    Last completed  Diet -  Healthy Exercise - working around the house and staying active Feeling - well Sleeping - well Concerns - none    Subjective    Robert Grant is a 68 y.o. male who presents today for his Annual Wellness Visit.  Discussed the use of AI scribe software for clinical note transcription with the patient, who gave verbal consent to proceed.  History of Present Illness   Robert Grant is a 68 year old male who presents for an annual wellness visit.  He experiences chronic sinus congestion, which is less severe than usual for this time of year, and manages it with Flonase  and a decongestant. Seasonal changes, particularly in spring and fall, typically worsen his sinus issues.  His blood pressure tends to be higher during doctor's visits, but at home, it usually remains in the 130s/70s range. He regularly monitors his blood pressure and noted a reading of 145/82 this morning. He is on several cardiovascular medications, including atorvastatin  40 mg daily, carvedilol  25 mg daily, eplerenone 25 mg twice daily, hydralazine  50 mg twice daily, lisinopril 10 mg daily, and torsemide  as needed. He also takes Xarelto  for anticoagulation and Farxiga  10 mg daily for blood sugar and heart health.  He recalls having a colonoscopy in June 2023, where precancerous polyps were found. He has a history of a large non-cancerous colon tumor and has had his colon removed in the past.  He has had a heart ablation in the past, during which he was temporarily taken off atorvastatin . He was later restarted on the medication after consulting with his healthcare provider.  He has hearing aids but is still getting used to them and does not wear them all the time.              Medications: Outpatient Medications Prior to Visit  Medication Sig   ascorbic acid (VITAMIN C) 500 MG tablet Take by mouth.   atorvastatin  (LIPITOR) 40 MG tablet Take 40 mg by mouth daily.   carvedilol  (COREG ) 25 MG tablet Take 25 mg by mouth 2 (two) times daily.   dapagliflozin  propanediol (FARXIGA ) 10 MG TABS tablet Take 1 tablet (10 mg total) by mouth daily.   eplerenone (INSPRA) 25 MG tablet Take 25 mg by mouth 2 (two) times daily.    fluticasone  (FLONASE ) 50 MCG/ACT nasal spray Place 2 sprays into both nostrils daily.   guaiFENesin -codeine  100-10 MG/5ML syrup Take 10 mLs by mouth 3 (three) times daily as needed for cough.   hydrALAZINE  (APRESOLINE ) 25 MG tablet Take 50 mg by mouth 2 (two) times daily.   lisinopril (ZESTRIL) 10 MG tablet Take 10 mg by mouth daily.   rivaroxaban  (XARELTO ) 20 MG TABS tablet Take 20 mg by mouth daily with supper.   torsemide  (DEMADEX ) 20 MG tablet TAKE 1 TABLETS BY MOUTH ONCE DAILY PRN weight gain >230   [DISCONTINUED] hydrALAZINE  (APRESOLINE ) 10 MG tablet Take 20 mg by mouth 4 (four) times daily.   No facility-administered medications prior to visit.    Allergies  Allergen Reactions   Niacin Hives    Other reaction(s): Other (See Comments) Hot flashes   Amiodarone  Nausea Only   Spironolactone Other (See Comments)  gynecomastia    Patient Care Team: Myrla Jon HERO, MD as PCP - General (Family Medicine)  Review of Systems       Objective    Vitals: BP (!) 169/93   Pulse 79   Ht 5' 9 (1.753 m)   Wt 218 lb 9.6 oz (99.2 kg)   SpO2 98%   BMI 32.28 kg/m      Physical Exam Vitals reviewed.  Constitutional:      General: He is not in acute distress.    Appearance: Normal appearance. He is well-developed. He is not diaphoretic.  HENT:     Head: Normocephalic and atraumatic.     Right Ear: Tympanic membrane, ear canal and external ear normal.     Left Ear: Tympanic membrane, ear canal and external ear  normal.     Nose: Nose normal.     Mouth/Throat:     Mouth: Mucous membranes are moist.     Pharynx: Oropharynx is clear. No oropharyngeal exudate.  Eyes:     General: No scleral icterus.    Conjunctiva/sclera: Conjunctivae normal.     Pupils: Pupils are equal, round, and reactive to light.  Neck:     Thyroid : No thyromegaly.  Cardiovascular:     Rate and Rhythm: Normal rate and regular rhythm.     Heart sounds: Murmur heard.  Pulmonary:     Effort: Pulmonary effort is normal. No respiratory distress.     Breath sounds: Normal breath sounds. No wheezing or rales.  Abdominal:     General: There is no distension.     Palpations: Abdomen is soft.     Tenderness: There is no abdominal tenderness.  Musculoskeletal:        General: No deformity.     Cervical back: Neck supple.     Right lower leg: No edema.     Left lower leg: No edema.  Lymphadenopathy:     Cervical: No cervical adenopathy.  Skin:    General: Skin is warm and dry.     Findings: No rash.  Neurological:     Mental Status: He is alert and oriented to person, place, and time. Mental status is at baseline.     Gait: Gait normal.  Psychiatric:        Mood and Affect: Mood normal.        Behavior: Behavior normal.        Thought Content: Thought content normal.     Most recent functional status assessment:    06/24/2024   12:28 PM  In your present state of health, do you have any difficulty performing the following activities:  Hearing? 0  Vision? 0  Difficulty concentrating or making decisions? 0  Walking or climbing stairs? 0  Dressing or bathing? 0  Doing errands, shopping? 0  Preparing Food and eating ? N  Using the Toilet? N  In the past six months, have you accidently leaked urine? N  Do you have problems with loss of bowel control? N  Managing your Medications? N  Managing your Finances? N  Housekeeping or managing your Housekeeping? N   Most recent fall risk assessment:    06/24/2024   12:28  PM  Fall Risk   Falls in the past year? 0  Number falls in past yr: 0  Injury with Fall? 0    Most recent depression screenings:    06/28/2024    8:27 AM 11/24/2023    1:03 PM  PHQ 2/9 Scores  PHQ -  2 Score 0 0   Most recent cognitive screening:    06/28/2024    8:27 AM  6CIT Screen  What Year? 0 points  What month? 0 points  What time? 0 points  Count back from 20 0 points  Months in reverse 0 points  Repeat phrase 6 points  Total Score 6 points   Most recent Audit-C alcohol use screening    06/28/2024    8:26 AM  Alcohol Use Disorder Test (AUDIT)  1. How often do you have a drink containing alcohol? 0  2. How many drinks containing alcohol do you have on a typical day when you are drinking? 0  3. How often do you have six or more drinks on one occasion? 0  AUDIT-C Score 0   A score of 3 or more in women, and 4 or more in men indicates increased risk for alcohol abuse, EXCEPT if all of the points are from question 1   No results found.  No results found for any visits on 06/28/24.  Assessment & Plan     Annual wellness visit done today including the all of the following: Reviewed patient's Family Medical History Reviewed and updated list of patient's medical providers Assessment of cognitive impairment was done Assessed patient's functional ability Established a written schedule for health screening services Health Risk Assessent Completed and Reviewed  Exercise Activities and Dietary recommendations  Goals      DIET - INCREASE WATER  INTAKE        Immunization History  Administered Date(s) Administered   Fluad Quad(high Dose 65+) 06/18/2022   INFLUENZA, HIGH DOSE SEASONAL PF 05/31/2021   Influenza,inj,Quad PF,6+ Mos 06/26/2014, 05/27/2015, 06/08/2016, 06/10/2017, 05/23/2018   Influenza-Unspecified 05/30/2014, 05/15/2019, 07/15/2020   Janssen (J&J) SARS-COV-2 Vaccination 03/02/2020   PNEUMOCOCCAL CONJUGATE-20 07/19/2022   Pneumococcal  Polysaccharide-23 05/27/2015   Tdap 02/13/2008   Zoster, Live 06/26/2014    Health Maintenance  Topic Date Due   Zoster Vaccines- Shingrix (1 of 2) 03/30/1973   DTaP/Tdap/Td (2 - Td or Tdap) 02/12/2018   COVID-19 Vaccine (2 - Janssen risk series) 03/30/2020   Colonoscopy  02/12/2023   HEMOGLOBIN A1C  06/26/2024   Influenza Vaccine  11/27/2024 (Originally 03/30/2024)   Diabetic kidney evaluation - eGFR measurement  12/25/2024   Diabetic kidney evaluation - Urine ACR  12/25/2024   FOOT EXAM  12/25/2024   OPHTHALMOLOGY EXAM  02/07/2025   Medicare Annual Wellness (AWV)  06/28/2025   Pneumococcal Vaccine: 50+ Years  Completed   Hepatitis C Screening  Completed   Meningococcal B Vaccine  Aged Out     Discussed health benefits of physical activity, and encouraged him to engage in regular exercise appropriate for his age and condition.    Problem List Items Addressed This Visit       Cardiovascular and Mediastinum   Hypertension associated with diabetes (HCC)   Blood pressure elevated today, likely due to sinus medication and stress. Home readings typically in the 130s/70s range. - Recheck blood pressure at the end of the visit - Monitor blood pressure at home and report via MyChart - Continue current antihypertensive regimen      Relevant Medications   atorvastatin  (LIPITOR) 40 MG tablet   hydrALAZINE  (APRESOLINE ) 25 MG tablet   Other Relevant Orders   Comprehensive metabolic panel with GFR   Paroxysmal atrial fibrillation (HCC)   Atrial fibrillation managed with Xarelto  and coreg       Relevant Medications   atorvastatin  (LIPITOR) 40 MG tablet  hydrALAZINE  (APRESOLINE ) 25 MG tablet   Other Relevant Orders   CBC w/Diff/Platelet     Endocrine   Hyperlipidemia associated with type 2 diabetes mellitus (HCC)   Hyperlipidemia managed with atorvastatin  40 mg daily, resumed post-heart ablation. - Check cholesterol as part of routine blood work      Relevant Medications    atorvastatin  (LIPITOR) 40 MG tablet   hydrALAZINE  (APRESOLINE ) 25 MG tablet   Other Relevant Orders   Comprehensive metabolic panel with GFR   Lipid panel   Controlled type 2 diabetes mellitus with complication, without long-term current use of insulin (HCC)   Diabetes management ongoing with Farxiga . Previous A1c in a good range. - Check A1c as part of routine blood work      Relevant Medications   atorvastatin  (LIPITOR) 40 MG tablet   Other Relevant Orders   Hemoglobin A1c     Other   History of colon polyps   Previous colonoscopy in June 2023 showed precancerous polyps. Follow-up colonoscopy was recommended in six months but not scheduled. - Schedule follow-up colonoscopy with Dr. Therisa      Relevant Orders   Ambulatory referral to Gastroenterology   Presence of heart assist device [facility])   AICD in place      Other Visit Diagnoses       Encounter for annual wellness visit (AWV) in Medicare patient    -  Primary   Relevant Orders   Comprehensive metabolic panel with GFR   Hemoglobin A1c   PSA   Lipid panel   CBC w/Diff/Platelet     Prostate cancer screening       Relevant Orders   PSA     Anticoagulant long-term use       Relevant Orders   CBC w/Diff/Platelet     Other long term (current) drug therapy       Relevant Orders   PSA           Adult Wellness Visit Routine wellness visit conducted with discussions on screenings, vaccinations, current medications, and health maintenance. - Perform routine blood work including A1c, cholesterol, kidney and liver function tests, PSA, and blood count due to Xarelto  use - Recheck blood pressure at the end of the visit - Schedule follow-up in six months  Chronic heart failure Chronic heart failure managed with carvedilol , eplerenone, hydralazine , lisinopril, and torsemide  as needed.  Allergic rhinitis (seasonal) Seasonal allergic rhinitis exacerbated by current sinus issues. Managed with Flonase  and decongestants. -  Continue Flonase  for sinus symptoms  General Health Maintenance Discussed vaccinations and screenings. Colonoscopy follow-up needed due to previous findings. Eye exam and foot exam up to date. - Consider shingles vaccine (new two-dose series) - Consider tetanus vaccine - Obtain flu vaccine at pharmacy        Return in about 6 months (around 12/27/2024) for chronic disease f/u.     Jon Eva, MD  [facility] [phone] (phone) [phone] (fax)  [facility]

## 2024-06-28 NOTE — Assessment & Plan Note (Signed)
 Atrial fibrillation managed with Xarelto  and coreg 

## 2024-06-28 NOTE — Assessment & Plan Note (Signed)
 Hyperlipidemia managed with atorvastatin  40 mg daily, resumed post-heart ablation. - Check cholesterol as part of routine blood work

## 2024-06-28 NOTE — Assessment & Plan Note (Signed)
 Previous colonoscopy in June 2023 showed precancerous polyps. Follow-up colonoscopy was recommended in six months but not scheduled. - Schedule follow-up colonoscopy with Dr. Therisa

## 2024-06-28 NOTE — Assessment & Plan Note (Signed)
 Blood pressure elevated today, likely due to sinus medication and stress. Home readings typically in the 130s/70s range. - Recheck blood pressure at the end of the visit - Monitor blood pressure at home and report via MyChart - Continue current antihypertensive regimen

## 2024-06-29 LAB — CBC WITH DIFFERENTIAL/PLATELET
Basophils Absolute: 0.1 x10E3/uL (ref 0.0–0.2)
Basos: 1 %
EOS (ABSOLUTE): 0.3 x10E3/uL (ref 0.0–0.4)
Eos: 4 %
Hematocrit: 47 % (ref 37.5–51.0)
Hemoglobin: 15.2 g/dL (ref 13.0–17.7)
Immature Grans (Abs): 0 x10E3/uL (ref 0.0–0.1)
Immature Granulocytes: 0 %
Lymphocytes Absolute: 2.1 x10E3/uL (ref 0.7–3.1)
Lymphs: 26 %
MCH: 29.8 pg (ref 26.6–33.0)
MCHC: 32.3 g/dL (ref 31.5–35.7)
MCV: 92 fL (ref 79–97)
Monocytes Absolute: 0.6 x10E3/uL (ref 0.1–0.9)
Monocytes: 8 %
Neutrophils Absolute: 5.2 x10E3/uL (ref 1.4–7.0)
Neutrophils: 61 %
Platelets: 172 x10E3/uL (ref 150–450)
RBC: 5.1 x10E6/uL (ref 4.14–5.80)
RDW: 13.7 % (ref 11.6–15.4)
WBC: 8.3 x10E3/uL (ref 3.4–10.8)

## 2024-06-29 LAB — COMPREHENSIVE METABOLIC PANEL WITH GFR
ALT: 30 IU/L (ref 0–44)
AST: 28 IU/L (ref 0–40)
Albumin: 4.9 g/dL (ref 3.9–4.9)
Alkaline Phosphatase: 70 IU/L (ref 47–123)
BUN/Creatinine Ratio: 14 (ref 10–24)
BUN: 14 mg/dL (ref 8–27)
Bilirubin Total: 0.9 mg/dL (ref 0.0–1.2)
CO2: 23 mmol/L (ref 20–29)
Calcium: 9.9 mg/dL (ref 8.6–10.2)
Chloride: 99 mmol/L (ref 96–106)
Creatinine, Ser: 0.97 mg/dL (ref 0.76–1.27)
Globulin, Total: 2 g/dL (ref 1.5–4.5)
Glucose: 169 mg/dL — ABNORMAL HIGH (ref 70–99)
Potassium: 4.5 mmol/L (ref 3.5–5.2)
Sodium: 143 mmol/L (ref 134–144)
Total Protein: 6.9 g/dL (ref 6.0–8.5)
eGFR: 84 mL/min/1.73

## 2024-06-29 LAB — LIPID PANEL
Chol/HDL Ratio: 3.9 ratio (ref 0.0–5.0)
Cholesterol, Total: 142 mg/dL (ref 100–199)
HDL: 36 mg/dL — ABNORMAL LOW (ref >39)
LDL Chol Calc (NIH): 63 mg/dL (ref 0–99)
Triglycerides: 266 mg/dL — ABNORMAL HIGH (ref 0–149)
VLDL Cholesterol Cal: 43 mg/dL — ABNORMAL HIGH (ref 5–40)

## 2024-06-29 LAB — HEMOGLOBIN A1C
Est. average glucose Bld gHb Est-mCnc: 134 mg/dL
Hgb A1c MFr Bld: 6.3 % — ABNORMAL HIGH (ref 4.8–5.6)

## 2024-06-29 LAB — PSA: Prostate Specific Ag, Serum: 1.1 ng/mL (ref 0.0–4.0)

## 2024-07-02 ENCOUNTER — Ambulatory Visit: Payer: Self-pay | Admitting: Family Medicine

## 2024-12-27 ENCOUNTER — Ambulatory Visit: Admitting: Family Medicine
